# Patient Record
Sex: Male | Born: 1970 | Race: White | Hispanic: No | Marital: Married | Smoking: Current every day smoker
Health system: Southern US, Community
[De-identification: ages and names within clinical notes are randomized; demographics above are authoritative.]

## PROBLEM LIST (undated history)

## (undated) DIAGNOSIS — I10 Essential (primary) hypertension: Secondary | ICD-10-CM

## (undated) DIAGNOSIS — Z720 Tobacco use: Secondary | ICD-10-CM

## (undated) DIAGNOSIS — I255 Ischemic cardiomyopathy: Secondary | ICD-10-CM

## (undated) DIAGNOSIS — I2510 Atherosclerotic heart disease of native coronary artery without angina pectoris: Secondary | ICD-10-CM

## (undated) DIAGNOSIS — I219 Acute myocardial infarction, unspecified: Secondary | ICD-10-CM

## (undated) DIAGNOSIS — E785 Hyperlipidemia, unspecified: Secondary | ICD-10-CM

## (undated) HISTORY — DX: Hyperlipidemia, unspecified: E78.5

## (undated) HISTORY — DX: Ischemic cardiomyopathy: I25.5

## (undated) HISTORY — DX: Atherosclerotic heart disease of native coronary artery without angina pectoris: I25.10

---

## 2004-07-24 ENCOUNTER — Emergency Department: Payer: Self-pay | Admitting: Emergency Medicine

## 2004-12-16 ENCOUNTER — Emergency Department: Payer: Self-pay | Admitting: Emergency Medicine

## 2006-07-22 ENCOUNTER — Emergency Department: Payer: Self-pay | Admitting: Emergency Medicine

## 2008-12-10 ENCOUNTER — Emergency Department: Payer: Self-pay | Admitting: Emergency Medicine

## 2009-11-27 ENCOUNTER — Emergency Department: Payer: Self-pay | Admitting: Emergency Medicine

## 2010-06-17 ENCOUNTER — Emergency Department: Payer: Self-pay | Admitting: Emergency Medicine

## 2011-08-27 ENCOUNTER — Emergency Department: Payer: Self-pay | Admitting: *Deleted

## 2012-02-08 ENCOUNTER — Emergency Department: Payer: Self-pay | Admitting: Emergency Medicine

## 2012-10-06 ENCOUNTER — Emergency Department: Payer: Self-pay | Admitting: Emergency Medicine

## 2013-03-01 ENCOUNTER — Emergency Department: Payer: Self-pay | Admitting: Emergency Medicine

## 2013-05-26 ENCOUNTER — Emergency Department: Payer: Self-pay | Admitting: Emergency Medicine

## 2013-10-13 ENCOUNTER — Emergency Department: Payer: Self-pay | Admitting: Emergency Medicine

## 2014-01-18 ENCOUNTER — Emergency Department: Payer: Self-pay | Admitting: Emergency Medicine

## 2014-01-23 ENCOUNTER — Encounter (HOSPITAL_COMMUNITY)
Admission: EM | Disposition: A | Discharge status: Home / Self Care | Payer: Self-pay | Source: Home / Self Care | Attending: Cardiology

## 2014-01-23 ENCOUNTER — Ambulatory Visit (HOSPITAL_COMMUNITY): Admit: 2014-01-23 | Payer: Self-pay | Admitting: Cardiovascular Disease

## 2014-01-23 ENCOUNTER — Inpatient Hospital Stay (HOSPITAL_COMMUNITY): Payer: Self-pay

## 2014-01-23 ENCOUNTER — Inpatient Hospital Stay (HOSPITAL_COMMUNITY)
Admission: EM | Admit: 2014-01-23 | Discharge: 2014-01-25 | DRG: 247 | Disposition: A | Discharge status: Home / Self Care | Payer: Self-pay | Attending: Cardiology | Admitting: Cardiology

## 2014-01-23 ENCOUNTER — Encounter (HOSPITAL_COMMUNITY): Payer: Self-pay | Admitting: Emergency Medicine

## 2014-01-23 DIAGNOSIS — I2102 ST elevation (STEMI) myocardial infarction involving left anterior descending coronary artery: Secondary | ICD-10-CM

## 2014-01-23 DIAGNOSIS — I25119 Atherosclerotic heart disease of native coronary artery with unspecified angina pectoris: Secondary | ICD-10-CM | POA: Diagnosis present

## 2014-01-23 DIAGNOSIS — F17200 Nicotine dependence, unspecified, uncomplicated: Secondary | ICD-10-CM | POA: Diagnosis present

## 2014-01-23 DIAGNOSIS — I2109 ST elevation (STEMI) myocardial infarction involving other coronary artery of anterior wall: Principal | ICD-10-CM | POA: Diagnosis present

## 2014-01-23 DIAGNOSIS — I213 ST elevation (STEMI) myocardial infarction of unspecified site: Secondary | ICD-10-CM

## 2014-01-23 DIAGNOSIS — Z955 Presence of coronary angioplasty implant and graft: Secondary | ICD-10-CM

## 2014-01-23 DIAGNOSIS — I252 Old myocardial infarction: Secondary | ICD-10-CM | POA: Diagnosis present

## 2014-01-23 DIAGNOSIS — Z720 Tobacco use: Secondary | ICD-10-CM | POA: Diagnosis present

## 2014-01-23 DIAGNOSIS — I2510 Atherosclerotic heart disease of native coronary artery without angina pectoris: Secondary | ICD-10-CM | POA: Diagnosis present

## 2014-01-23 HISTORY — PX: LEFT HEART CATHETERIZATION WITH CORONARY ANGIOGRAM: SHX5451

## 2014-01-23 HISTORY — DX: Tobacco use: Z72.0

## 2014-01-23 LAB — DIFFERENTIAL
Basophils Absolute: 0 10*3/uL (ref 0.0–0.1)
Basophils Relative: 0 % (ref 0–1)
Eosinophils Absolute: 0.4 10*3/uL (ref 0.0–0.7)
Eosinophils Relative: 3 % (ref 0–5)
LYMPHS ABS: 3.7 10*3/uL (ref 0.7–4.0)
LYMPHS PCT: 28 % (ref 12–46)
Monocytes Absolute: 1.5 10*3/uL — ABNORMAL HIGH (ref 0.1–1.0)
Monocytes Relative: 12 % (ref 3–12)
NEUTROS ABS: 7.6 10*3/uL (ref 1.7–7.7)
NEUTROS PCT: 57 % (ref 43–77)

## 2014-01-23 LAB — BASIC METABOLIC PANEL
BUN: 10 mg/dL (ref 6–23)
CO2: 21 mEq/L (ref 19–32)
Calcium: 8.8 mg/dL (ref 8.4–10.5)
Chloride: 101 mEq/L (ref 96–112)
Creatinine, Ser: 0.81 mg/dL (ref 0.50–1.35)
GFR calc non Af Amer: >90 mL/min (ref >90)
Glucose, Bld: 92 mg/dL (ref 70–99)
Potassium: 4 mEq/L (ref 3.7–5.3)
SODIUM: 138 meq/L (ref 137–147)

## 2014-01-23 LAB — CBC
HCT: 44.6 % (ref 39.0–52.0)
Hemoglobin: 15.5 g/dL (ref 13.0–17.0)
MCH: 30.9 pg (ref 26.0–34.0)
MCHC: 34.8 g/dL (ref 30.0–36.0)
MCV: 89 fL (ref 78.0–100.0)
PLATELETS: 208 10*3/uL (ref 150–400)
RBC: 5.01 MIL/uL (ref 4.22–5.81)
RDW: 13.2 % (ref 11.5–15.5)
WBC: 13.2 10*3/uL — AB (ref 4.0–10.5)

## 2014-01-23 LAB — MRSA PCR SCREENING: MRSA by PCR: NEGATIVE

## 2014-01-23 LAB — PROTIME-INR
INR: 0.97 (ref 0.00–1.49)
PROTHROMBIN TIME: 12.7 s (ref 11.6–15.2)

## 2014-01-23 LAB — APTT: APTT: 27 s (ref 24–37)

## 2014-01-23 LAB — TROPONIN I: Troponin I: >20 ng/mL (ref <0.30)

## 2014-01-23 SURGERY — LEFT HEART CATHETERIZATION WITH CORONARY ANGIOGRAM
Anesthesia: LOCAL

## 2014-01-23 MED ORDER — PRASUGREL HCL 10 MG PO TABS
10.0000 mg | ORAL_TABLET | Freq: Every day | ORAL | Status: DC
  Administered 2014-01-24 – 2014-01-25 (×2): 10 mg via ORAL
  Filled 2014-01-23 (×2): qty 1

## 2014-01-23 MED ORDER — NITROGLYCERIN 0.4 MG SL SUBL: 0.4000 mg | SUBLINGUAL_TABLET | SUBLINGUAL | Status: DC | PRN

## 2014-01-23 MED ORDER — CARVEDILOL 3.125 MG PO TABS
3.1250 mg | ORAL_TABLET | Freq: Two times a day (BID) | ORAL | Status: DC
  Administered 2014-01-24 – 2014-01-25 (×3): 3.125 mg via ORAL
  Filled 2014-01-23 (×5): qty 1

## 2014-01-23 MED ORDER — HEPARIN SODIUM (PORCINE) 5000 UNIT/ML IJ SOLN
5000.0000 [IU] | Freq: Three times a day (TID) | INTRAMUSCULAR | Status: DC
  Administered 2014-01-24 (×2): 5000 [IU] via SUBCUTANEOUS
  Filled 2014-01-23 (×7): qty 1

## 2014-01-23 MED ORDER — OXYCODONE-ACETAMINOPHEN 5-325 MG PO TABS: 1.0000 | ORAL_TABLET | ORAL | Status: DC | PRN

## 2014-01-23 MED ORDER — PRASUGREL HCL 10 MG PO TABS
ORAL_TABLET | ORAL | Status: AC
  Filled 2014-01-23: qty 1

## 2014-01-23 MED ORDER — MIDAZOLAM HCL 2 MG/2ML IJ SOLN
INTRAMUSCULAR | Status: AC
  Filled 2014-01-23: qty 2

## 2014-01-23 MED ORDER — ATORVASTATIN CALCIUM 80 MG PO TABS
80.0000 mg | ORAL_TABLET | Freq: Every day | ORAL | Status: DC
  Administered 2014-01-23 – 2014-01-25 (×3): 80 mg via ORAL
  Filled 2014-01-23 (×3): qty 1

## 2014-01-23 MED ORDER — SODIUM CHLORIDE 0.9 % IJ SOLN: 3.0000 mL | INTRAMUSCULAR | Status: DC | PRN

## 2014-01-23 MED ORDER — NITROGLYCERIN 0.2 MG/ML ON CALL CATH LAB
INTRAVENOUS | Status: AC
  Filled 2014-01-23: qty 1

## 2014-01-23 MED ORDER — SODIUM CHLORIDE 0.9 % IV SOLN: 250.0000 mL | INTRAVENOUS | Status: DC | PRN

## 2014-01-23 MED ORDER — FENTANYL CITRATE 0.05 MG/ML IJ SOLN
INTRAMUSCULAR | Status: AC
  Filled 2014-01-23: qty 2

## 2014-01-23 MED ORDER — DIAZEPAM 5 MG PO TABS
5.0000 mg | ORAL_TABLET | ORAL | Status: DC | PRN
Start: 2014-01-23 — End: 2014-01-25

## 2014-01-23 MED ORDER — VERAPAMIL HCL 2.5 MG/ML IV SOLN
INTRAVENOUS | Status: AC
  Filled 2014-01-23: qty 2

## 2014-01-23 MED ORDER — BIVALIRUDIN 250 MG IV SOLR
INTRAVENOUS | Status: AC
  Filled 2014-01-23: qty 250

## 2014-01-23 MED ORDER — ASPIRIN 81 MG PO CHEW
81.0000 mg | CHEWABLE_TABLET | Freq: Every day | ORAL | Status: DC
  Administered 2014-01-24 – 2014-01-25 (×2): 81 mg via ORAL
  Filled 2014-01-23 (×2): qty 1

## 2014-01-23 MED ORDER — LIDOCAINE HCL (PF) 1 % IJ SOLN
INTRAMUSCULAR | Status: AC
  Filled 2014-01-23: qty 30

## 2014-01-23 MED ORDER — HEPARIN (PORCINE) IN NACL 2-0.9 UNIT/ML-% IJ SOLN
INTRAMUSCULAR | Status: AC
  Filled 2014-01-23: qty 1000

## 2014-01-23 MED ORDER — HEPARIN SODIUM (PORCINE) 5000 UNIT/ML IJ SOLN
4000.0000 [IU] | Freq: Once | INTRAMUSCULAR | Status: AC
  Administered 2014-01-23: 4000 [IU] via INTRAVENOUS

## 2014-01-23 MED ORDER — SODIUM CHLORIDE 0.9 % IV SOLN
1.0000 mL/kg/h | INTRAVENOUS | Status: AC
  Administered 2014-01-23: 1 mL/kg/h via INTRAVENOUS

## 2014-01-23 MED ORDER — SODIUM CHLORIDE 0.9 % IJ SOLN
3.0000 mL | Freq: Two times a day (BID) | INTRAMUSCULAR | Status: DC
  Administered 2014-01-24 – 2014-01-25 (×3): 3 mL via INTRAVENOUS

## 2014-01-23 NOTE — CV Procedure (Signed)
Cardiac Catheterization Procedure Note  Name: Kenneth LoweMichael Medina MRN: [id number] DOB: 12/09/1970  Procedure: Left Heart Cath, Selective Coronary Angiography, LV angiography, PTCA and stenting of the mid-LAD  Indication: Anterior STEMI  Procedural Details:  The right wrist was prepped, draped, and anesthetized with 1% lidocaine. Using the modified Seldinger technique, a 5/6 French sheath was introduced into the right radial artery. 3 mg of verapamil was administered through the sheath, weight-based unfractionated heparin was administered intravenously. Standard Judkins catheters were used for selective coronary angiography and left ventriculography. Ventriculography was performed after PCI. Catheter exchanges were performed over an exchange length guidewire.  PROCEDURAL FINDINGS Hemodynamics: AO 100/67 LV 107/24   Coronary angiography: Coronary dominance: right  Left mainstem: The left mainstem is patent. There is no significant obstructive disease. The left main divides into the LAD and left circumflex.  Left anterior descending (LAD): The proximal LAD has minor irregularities. The first diagonal bifurcates at its origin and essentially supplies a ramus intermedius territory. There is a 30-40% proximal stenosis. Just after the first septal perforator, the LAD is totally occluded with TIMI 0 flow.  Left circumflex (LCx): The left circumflex is small to medium in caliber and covers a small distribution. There are 2 small obtuse marginal branches without significant disease.  Right coronary artery (RCA): Large, dominant vessel. There are diffuse luminal irregularities without significant stenosis. The PDA branch is large with mild 20-30% proximal stenosis. The first and second posterolateral branches are moderate in caliber without significant stenosis. There is collateral filling of the apical LAD.  Left ventriculography: Left ventricular systolic function is mildly depressed. There is  severe hypokinesis of the mid and distal anterior walls as well as the apex. The basal anterior and basal and mid-inferior walls are hyperdynamic. The estimated LVEF is 45%.  PCI Note:  Following the diagnostic procedure, the decision was made to proceed with PCI. The patient had received aspirin 324 mg and heparin 4000 units in the emergency department. He was loaded with effient 60 mg on the table. Weight-based bivalirudin was given for anticoagulation. Once a therapeutic ACT was achieved, a 6 JamaicaFrench XB LAD guide catheter was inserted.  A cougar coronary guidewire was used to cross the lesion.  The lesion was predilated with a 2.0 x 15 mm balloon.  The lesion was then stented with a 2.75 x 22 mm resolute drug-eluting stent.  The stent was postdilated with a 3.25 mm noncompliant balloon to 14 atmospheres.  Following PCI, there was 0% residual stenosis and TIMI-3 flow. Final angiography confirmed an excellent result. The patient tolerated the procedure well. He was chest pain-free at the completion of the procedure There were no immediate procedural complications. A TR band was used for radial hemostasis. The patient was transferred to the post catheterization recovery area for further monitoring.  PCI Data: Vessel - LAD/Segment - mid Percent Stenosis (pre)  100  TIMI-flow 0 Stent 2.75x22 mm Resolute DES (post-dil to > 3.25) Percent Stenosis (post) 0 TIMI-flow (post) 3  Final Conclusions:   1. Total occlusion of the LAD after the first septal perforator, treated successfully with primary PCI using a DES platform 2. Minor nonobstructive stenosis of the LM, LCx, and RCA 3. Mild segmental LV systolic dysfunction with moderately elevated LVEDP   Recommendations:  Initiate post-MI med Rx with carvedilol, ASA, Effient, high-dose atorvastatin. Start low-dose ACE in am if BP will allow. Tobacco cessation counseling. Anticipate tx out of ICU tomorrow if stable and discharge on hospital day #2.  Kenneth Medina 01/23/2014, 9:55 PM

## 2014-01-23 NOTE — Interval H&P Note (Signed)
History and Physical Interval Note:  01/23/2014 9:13 PM  Kenneth Medina  has presented today for surgery, with the diagnosis of STEMI  The various methods of treatment have been discussed with the patient and family. After consideration of risks, benefits and other options for treatment, the patient has consented to  Procedure(s): LEFT HEART CATHETERIZATION WITH CORONARY ANGIOGRAM (N/A) as a surgical intervention .  The patient's history has been reviewed, patient examined, no change in status, stable for surgery.  I have reviewed the patient's chart and labs.  Questions were answered to the patient's satisfaction.    Cath Lab Visit (complete for each Cath Lab visit)  Clinical Evaluation Leading to the Procedure:   ACS: yes  Non-ACS:    Anginal Classification: CCS IV  Anti-ischemic medical therapy: No Therapy  Non-Invasive Test Results: No non-invasive testing performed  Prior CABG: No previous CABG       Tonny Bollman

## 2014-01-23 NOTE — ED Notes (Signed)
Patient being transported on zoll to cath lab at this time by [facility]. Pt remains A&Ox4. Resp even and unlabored.

## 2014-01-23 NOTE — H&P (Signed)
History and Physical  Patient ID: Kenneth LoweMichael Medina MRN: [id number], SOB: 04/10/1971 43 y.o. Date of Encounter: 01/23/2014, 9:11 PM  Primary Physician: No primary provider on file. Primary Cardiologist: none  Chief Complaint: Chest pain  HPI: 43 y.o. male w/ PMHx significant for tobacco who [facility] with complaints of chest pain.  The patient developed severe substernal chest pain radiating down both arms approximately one hour prior to arrival here. He had no prodromal symptoms. He's had no recent illnesses. He called EMS as his pain was severe, rated 10/10. His EKG in the field demonstrated anterior injury currents and a code STEMI was called. I evaluated the patient on arrival to the emergency department. He had received fentanyl 100 mcg IV and aspirin 324 mg orally prior to arrival. He was also on nitroglycerin paste. His pain level on arrival was 3/10.  The patient has no other medical problems. He has had a neck strain at work but is not currently taking any medication for this. He had no past surgeries. He has no history of hypertension, diabetes, dyslipidemia, or any family history of CAD. He is an active smoker approximately 1/2 pack per day. He has no history of bleeding problems or DVT.  Past Medical History  Diagnosis Date  . Tobacco abuse      Surgical History: History reviewed. No pertinent past surgical history.   Home Meds: Prior to Admission medications   Not on File    Allergies:  Allergies  Allergen Reactions  . Ibuprofen     History   Social History  . Marital Status: N/A    Spouse Name: N/A    Number of Children: N/A  . Years of Education: N/A   Occupational History  . Not on file.   Social History Main Topics  . Smoking status: Not on file  . Smokeless tobacco: Not on file  . Alcohol Use: Not on file  . Drug Use: Not on file  . Sexual Activity: Not on file   Other Topics Concern  . Not on file   Social  History Narrative   The patient is married. 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 He does not drink alcohol or use illicit drugs. He does smoke cigarettes one half pack per day.     No family history on file.  Review of Systems: General: negative for chills, fever, night sweats or weight changes.  ENT: negative for rhinorrhea or epistaxis Cardiovascular: See history of present illness Dermatological: negative for rash Respiratory: negative for cough or wheezing GI: negative for nausea, vomiting, diarrhea, bright red blood per rectum, melena, or hematemesis GU: no hematuria, urgency, or frequency Neurologic: negative for visual changes, syncope, headache, or dizziness Heme: no easy bruising or bleeding Endo: negative for excessive thirst, thyroid disorder, or flushing Musculoskeletal: Positive for neck pain All other systems reviewed and are otherwise negative except as noted above.  Physical Exam: Blood pressure 131/80, pulse 77, resp. rate 19, weight 81.647 kg (180 lb), SpO2 100.00%. General: Well developed, well nourished, alert and oriented, in moderate distress. HEENT: Normocephalic, atraumatic, sclera non-icteric, no xanthomas, nares are without discharge.  Neck: Supple. Carotids 2+ without bruits. JVP normal Lungs: Clear bilaterally to auscultation without wheezes, rales, or rhonchi. Breathing is unlabored. Heart: RRR with normal S1 and S2. No murmurs, rubs, or gallops appreciated. Abdomen: Soft, non-tender, non-distended with normoactive bowel sounds. No hepatomegaly. No rebound/guarding. No obvious abdominal masses. Back: No CVA  tenderness Msk:  Strength and tone appear normal for age. Extremities: No clubbing, cyanosis, or edema.  Distal pedal pulses are 2+ and equal bilaterally. Neuro: CNII-XII intact, moves all extremities spontaneously. Psych:  Responds to questions appropriately   Labs:   Lab Results  Component Value Date   WBC 13.2* 01/23/2014   HGB 15.5  01/23/2014   HCT 44.6 01/23/2014   MCV 89.0 01/23/2014   PLT 208 01/23/2014     Recent Labs Lab 01/23/14 2033  NA 138  K 4.0  CL 101  CO2 21  BUN 10  CREATININE 0.81  CALCIUM 8.8  GLUCOSE 92   No results found for this basename: CKTOTAL, CKMB, TROPONINI,  in the last 72 hours No results found for this basename: CHOL,  HDL,  LDLCALC,  TRIG   No results found for this basename: DDIMER    Radiology/Studies:  No results found.   EKG: Sinus bradycardia with acute anterior ST elevation MI pattern  ASSESSMENT AND PLAN:  1. Acute anterior wall MI. The patient has received aspirin 324 mg in the field. He is given unfractionated heparin 4000 units in the emergency department. He will be taken directly to the cardiac catheterization lab for cardiac cath and possible PCI. I have reviewed the potential risks, indications, and alternatives to this procedure. Emergency applied consent was obtained. Further plans pending procedural findings.  2. Tobacco abuse. Cessation counseling will be done.  Will check a lipid panel, baseline labs, and initiate post MI medical therapy as indicated.  Kenneth Medina, Kenneth Medina  01/23/2014, 9:11 PM

## 2014-01-23 NOTE — ED Provider Notes (Signed)
CSN: [id number]     Arrival date & time 01/23/14  1956 History   First MD Initiated Contact with Patient 01/23/14 2006     Chief Complaint  Patient presents with  . Code STEMI     (Consider location/radiation/quality/duration/timing/severity/associated sxs/prior Treatment) HPI Pt brought to the ED via [facility] for evaluation of STEMI. He had sudden onset chest pain, SOB and L arm pain earlier today. EMS gave ASA, NTG and fentanyl with partial relief of pain from 10/10 to 2/10. He has not known PMH, but he is a smoker. Cath lab not ready on patient arrival, so he stopped for brief evaluation in the ED. Dr. Excell Seltzer at bedside on patient arrival.   History reviewed. No pertinent past medical history. History reviewed. No pertinent past surgical history. No family history on file. History  Substance Use Topics  . Smoking status: Not on file  . Smokeless tobacco: Not on file  . Alcohol Use: Not on file    Review of Systems All other systems reviewed and are negative except as noted in HPI.     Allergies  Ibuprofen  Home Medications   Prior to Admission medications   Not on File   BP 131/80  Pulse 77  Resp 19  Wt 180 lb (81.647 kg)  SpO2 100% Physical Exam  Nursing note and vitals reviewed. Constitutional: He is oriented to person, place, and time. He appears well-developed and well-nourished.  HENT:  Head: Normocephalic and atraumatic.  Eyes: EOM are normal. Pupils are equal, round, and reactive to light.  Neck: Normal range of motion. Neck supple.  Cardiovascular: Normal rate, normal heart sounds and intact distal pulses.   Pulmonary/Chest: Effort normal and breath sounds normal.  Abdominal: Bowel sounds are normal. He exhibits no distension. There is no tenderness.  Musculoskeletal: Normal range of motion. He exhibits no edema and no tenderness.  Neurological: He is alert and oriented to person, place, and time. He has normal strength. No cranial nerve deficit  or sensory deficit.  Skin: Skin is warm and dry. No rash noted.  Psychiatric: He has a normal mood and affect.    ED Course  Procedures (including critical care time) Labs Review Labs Reviewed - No data to display  Imaging Review No results found.  EKG not in MUSE for interpretation  Date: 01/23/2014  Rate: 69  Rhythm: normal sinus rhythm  QRS Axis: normal  Intervals: normal  ST/T Wave abnormalities: ST elevations anteriorly  Conduction Disutrbances:none  Narrative Interpretation:   Old EKG Reviewed: none available    MDM   Final diagnoses:  STEMI (ST elevation myocardial infarction)    STEMI patient, given Heparin, taken to cath lab    Charles B. Bernette Mayers, MD 01/23/14 2013

## 2014-01-23 NOTE — ED Notes (Signed)
Patient presents to ED via [facility]. Pt states that he had a "sudden onset" of center chest pain with no radiation. Pt denies any shortness of breath, no nausea/vomiting. Pt A&Ox4. Per EMS elevation in anterior leads and depression in inferior leads. Code Stemi called. Cardiology at bedside. Pt given 324 ASA. 3 sl nitro, 1 nitro patch and 100 mcg of fentanyl by EMS. Rating pain 3/10 upon arrival. VSS.

## 2014-01-23 NOTE — ED Notes (Signed)
Dr. Excell Seltzer and Dr. Bernette Mayers at bedside at this time.

## 2014-01-23 NOTE — ED Notes (Signed)
Patient placed on zoll and 12 lead. Dr. Excell Seltzer requesting that patient be taken to cath lab at this time.

## 2014-01-23 NOTE — Progress Notes (Signed)
CRITICAL VALUE ALERT  Critical value received:  Troponin >20.00  Date of notification:  01/23/14  Time of notification: 2334  Critical value read back:yes  Nurse who received alert: Bronson Curb RN  Expected lab value. Pt is post Cath Lab from code STEMI

## 2014-01-24 ENCOUNTER — Encounter (HOSPITAL_COMMUNITY): Payer: Self-pay | Admitting: Emergency Medicine

## 2014-01-24 DIAGNOSIS — F17200 Nicotine dependence, unspecified, uncomplicated: Secondary | ICD-10-CM

## 2014-01-24 DIAGNOSIS — I517 Cardiomegaly: Secondary | ICD-10-CM

## 2014-01-24 DIAGNOSIS — I2510 Atherosclerotic heart disease of native coronary artery without angina pectoris: Secondary | ICD-10-CM | POA: Diagnosis present

## 2014-01-24 DIAGNOSIS — I25119 Atherosclerotic heart disease of native coronary artery with unspecified angina pectoris: Secondary | ICD-10-CM | POA: Diagnosis present

## 2014-01-24 DIAGNOSIS — I219 Acute myocardial infarction, unspecified: Secondary | ICD-10-CM

## 2014-01-24 DIAGNOSIS — Z720 Tobacco use: Secondary | ICD-10-CM | POA: Diagnosis present

## 2014-01-24 LAB — BASIC METABOLIC PANEL
BUN: 9 mg/dL (ref 6–23)
CO2: 25 mEq/L (ref 19–32)
Calcium: 8.6 mg/dL (ref 8.4–10.5)
Chloride: 106 mEq/L (ref 96–112)
Creatinine, Ser: 0.78 mg/dL (ref 0.50–1.35)
GLUCOSE: 99 mg/dL (ref 70–99)
POTASSIUM: 4 meq/L (ref 3.7–5.3)
Sodium: 142 mEq/L (ref 137–147)

## 2014-01-24 LAB — CBC
HCT: 43 % (ref 39.0–52.0)
HEMOGLOBIN: 14.4 g/dL (ref 13.0–17.0)
MCH: 30.1 pg (ref 26.0–34.0)
MCHC: 33.5 g/dL (ref 30.0–36.0)
MCV: 90 fL (ref 78.0–100.0)
PLATELETS: 219 10*3/uL (ref 150–400)
RBC: 4.78 MIL/uL (ref 4.22–5.81)
RDW: 13.5 % (ref 11.5–15.5)
WBC: 13.1 10*3/uL — ABNORMAL HIGH (ref 4.0–10.5)

## 2014-01-24 LAB — HEMOGLOBIN A1C
HEMOGLOBIN A1C: 5.8 % — AB (ref <5.7)
Mean Plasma Glucose: 120 mg/dL — ABNORMAL HIGH (ref <117)

## 2014-01-24 LAB — LIPID PANEL
CHOL/HDL RATIO: 8.1 ratio
CHOLESTEROL: 179 mg/dL (ref 0–200)
HDL: 22 mg/dL — ABNORMAL LOW (ref >39)
LDL Cholesterol: 129 mg/dL — ABNORMAL HIGH (ref 0–99)
Triglycerides: 142 mg/dL (ref <150)
VLDL: 28 mg/dL (ref 0–40)

## 2014-01-24 LAB — TROPONIN I: Troponin I: >20 ng/mL (ref <0.30)

## 2014-01-24 LAB — TSH: TSH: 0.595 u[IU]/mL (ref 0.350–4.500)

## 2014-01-24 LAB — POCT ACTIVATED CLOTTING TIME: Activated Clotting Time: 409 seconds

## 2014-01-24 LAB — PRO B NATRIURETIC PEPTIDE: Pro B Natriuretic peptide (BNP): 185.8 pg/mL — ABNORMAL HIGH (ref 0–125)

## 2014-01-24 NOTE — Progress Notes (Signed)
    SUBJECTIVE:  Kenneth Medina was seen and examined at bedside this morning. S/p cath last night. Feeling much better today. No CP or SOB.   Filed Vitals:   01/24/14 0300 01/24/14 0400 01/24/14 0500 01/24/14 0600  BP: 114/41 104/63 105/61 106/59  Pulse: 63 65 63 58  Temp:      TempSrc:      Resp: 19 15 15 14   Height:      Weight:      SpO2: 99% 99% 99% 99%    Intake/Output Summary (Last 24 hours) at 01/24/14 0707 Last data filed at 01/24/14 0600  Gross per 24 hour  Intake  652.8 ml  Output   1100 ml  Net -447.2 ml   LABS: Basic Metabolic Panel:  Recent Labs  23/76/28 2033 01/24/14 0434  NA 138 142  K 4.0 4.0  CL 101 106  CO2 21 25  GLUCOSE 92 99  BUN 10 9  CREATININE 0.81 0.78  CALCIUM 8.8 8.6   CBC:  Recent Labs  01/23/14 2033 01/24/14 0434  WBC 13.2* 13.1*  NEUTROABS 7.6  --   HGB 15.5 14.4  HCT 44.6 43.0  MCV 89.0 90.0  PLT 208 219   Cardiac Enzymes:  Recent Labs  01/23/14 2242 01/24/14 0434  TROPONINI >20.00* >20.00*   Fasting Lipid Panel:  Recent Labs  01/24/14 0434  CHOL 179  HDL 22*  LDLCALC 129*  TRIG 142  CHOLHDL 8.1   Thyroid Function Tests:  Recent Labs  01/24/14 0434  TSH 0.595   RADIOLOGY: Portable Chest X-ray 1 View  01/23/2014   CLINICAL DATA:  Chest pain.  Admission for STEMI  EXAM: PORTABLE CHEST - 1 VIEW  COMPARISON:  None.  FINDINGS: The heart size and mediastinal contours are within normal limits. Both lungs are clear. The visualized skeletal structures are unremarkable.  IMPRESSION: No active disease.   Electronically Signed   By: Burman Nieves M.D.   On: 01/23/2014 22:25   PHYSICAL EXAM General: NAD, lying in bed Lungs: Clear to auscultation bilaterally with normal respiratory effort. CV: Bradycardia. No murmur Abdomen: Soft, nontender, +bs Neurologic: Alert and oriented x 3.  Psych: Normal affect. Extremities: +2dp b/l, -edema  ASSESSMENT AND PLAN: Kenneth Medina is a 43 year old smoking male admitted  for STEMI.  Anterior STEMI--s/p LHC 01/23/14 with DES to LAD, minor nonobstructive stenosis of LM, LCx, and RCA, mild LV systolic segmental dysfunction with moderately elevated LVEDP. -continue ASA, Effient, statin, BB -would benefit from low dose ACEi if BP can tolerate, currently 100's/50's -smoking cessation strongly advised -cardiac rehab -transfer to tele -anticipated d/c tomorrow CAD Tobacco use, ongoing  Case discussed and patient seen with Dr. Shirlee Latch  Signed: Darden Palmer, MD PGY-2, Internal Medicine Resident Pager: [phone]  01/24/2014,7:11 AM  Patient seen and examined with Dr. Virgina Organ We discussed all aspects of the encounter. I agree with the assessment and plan as stated above.   He stable s/p emergent PCI of LAD in setting of anterior STEMI. EF 45%. No further CP. Tolerating current regimen. BP too soft for ACE-I. Can transfer to tele today. Cardiac rehab to see. Stressed importance of smoking cessation, DAPT and statin therapy.   Probable d/c home in am.   Bevelyn Buckles Bensimhon,MD 8:35 AM

## 2014-01-24 NOTE — Progress Notes (Signed)
  Echocardiogram 2D Echocardiogram has been performed.  Kenneth Medina 01/24/2014, 11:07 AM

## 2014-01-24 NOTE — Progress Notes (Signed)
CARDIAC REHAB PHASE I   PRE:  Rate/Rhythm: 69 SR  BP:  Supine: 114/64  Sitting:   Standing:    SaO2:   MODE:  Ambulation: 700 ft   POST:  Rate/Rhythm: 88 SR  BP:  Supine:   Sitting: 117/59  Standing:    SaO2:  0835-0940 Pt walked 700 ft with steady gait. No CP. MI ed completed with pt who voiced understanding. Discussed CRP 2 and pt gave permission to refer to [facility] program. Discussed smoking cessation and gave handouts. Pt stated he and wife plan to quit together. Gave effient packet but pt needs to see case manager as he has no insurance. Discussed decreasing salt intake as pt stated he salts everything. Pt also advised to cut back on caffeine and no more energy drinks.   Luetta Nutting, RN BSN  01/24/2014 9:55 AM

## 2014-01-24 NOTE — Care Management Note (Signed)
    Page 1 of 1   01/24/2014     12:10:30 PM CARE MANAGEMENT NOTE 01/24/2014  Patient:  WESSON, HOLDERBAUM   Account Number:  [id number]  Date Initiated:  01/24/2014  Documentation initiated by:  Junius Creamer  Subjective/Objective Assessment:   adm w mi     Action/Plan:   lives w fam   Anticipated DC Date:     Anticipated DC Plan:        DC Planning Services  CM consult  Medication Assistance  [facility]      Choice offered to / List presented to:             Status of service:   Medicare Important Message given?   (If response is "NO", the following Medicare IM given date fields will be blank) Date Medicare IM given:   Date Additional Medicare IM given:    Discharge Disposition:    Per UR Regulation:  Reviewed for med. necessity/level of care/duration of stay  If discussed at Long Length of Stay Meetings, dates discussed:    Comments:  5/13 1210p debbie Lorrayne Ismael rn,bsn spoke w pt. no ins at present. pt given 30day free effient card. effient pt assist form on shadow chart. gave pt prescription discount card for brand name meds that may help w cost. gave [place]

## 2014-01-24 NOTE — Plan of Care (Signed)
Problem: Consults Goal: Tobacco Cessation referral if indicated Outcome: Completed/Met Date Met:  01/24/14 Pt reports he is quitting after this experience.

## 2014-01-24 NOTE — Progress Notes (Signed)
Attempted to call report to 3W nurse. Nurse unable to get report. Phone number given to Diplomatic Services operational officer. Awaiting returned call.

## 2014-01-25 DIAGNOSIS — I2109 ST elevation (STEMI) myocardial infarction involving other coronary artery of anterior wall: Secondary | ICD-10-CM

## 2014-01-25 LAB — CBC
HEMATOCRIT: 45.3 % (ref 39.0–52.0)
Hemoglobin: 15 g/dL (ref 13.0–17.0)
MCH: 30.2 pg (ref 26.0–34.0)
MCHC: 33.1 g/dL (ref 30.0–36.0)
MCV: 91.3 fL (ref 78.0–100.0)
PLATELETS: 218 10*3/uL (ref 150–400)
RBC: 4.96 MIL/uL (ref 4.22–5.81)
RDW: 13.9 % (ref 11.5–15.5)
WBC: 12.8 10*3/uL — ABNORMAL HIGH (ref 4.0–10.5)

## 2014-01-25 MED ORDER — PRASUGREL HCL 10 MG PO TABS: 10.0000 mg | ORAL_TABLET | Freq: Every day | ORAL | Status: DC

## 2014-01-25 MED ORDER — ASPIRIN 81 MG PO CHEW: 81.0000 mg | CHEWABLE_TABLET | Freq: Every day | ORAL | Status: AC

## 2014-01-25 MED ORDER — ATORVASTATIN CALCIUM 80 MG PO TABS: 80.0000 mg | ORAL_TABLET | Freq: Every day | ORAL | Status: DC

## 2014-01-25 MED ORDER — NITROGLYCERIN 0.4 MG SL SUBL: 0.4000 mg | SUBLINGUAL_TABLET | SUBLINGUAL | Status: DC | PRN

## 2014-01-25 MED ORDER — CARVEDILOL 3.125 MG PO TABS
3.1250 mg | ORAL_TABLET | Freq: Two times a day (BID) | ORAL | Status: DC
Start: 2014-01-25 — End: 2014-02-12

## 2014-01-25 MED FILL — Sodium Chloride IV Soln 0.9%: INTRAVENOUS | Qty: 50 | Status: AC

## 2014-01-25 NOTE — Discharge Summary (Signed)
` Physician Discharge Summary  Patient ID: Kenneth LoweMichael Medina MRN: [id number] DOB/AGE: 45/02/1971 45 y.o.  Primary Cardiologist: Dr. Excell Seltzerooper  Admit date: 01/23/2014 Discharge date: 01/25/2014  Admission Diagnoses: Anterior STEMI  Discharge Diagnoses:  Principal Problem:   ST elevation (STEMI) myocardial infarction involving left anterior descending coronary artery Active Problems:   CAD (coronary artery disease)   Tobacco use   Discharged Condition: stable  Hospital Course:  The patient is a 43 y.o. male, admitted on 01/23/14 for STEMI. His PMHx is significant for tobacco use. He developed severe substernal chest pain radiating down both arms approximately one hour prior to [facility]. He had no prodromal symptoms and no recent illnesses. He called EMS as his pain was severe, rated 10/10. His EKG in the field demonstrated anterior injury currents and a code STEMI was called. He was taken urgently to the cath lab. Dr. Excell Seltzerooper performed the procedure via the right radial artery. He was found to have total occlusion of the LAD after the first septal perforator, treated successfully with primary PCI using a DES platform. There was also minor nonobstructive stenosis of the LM, LCx, and RCA. There was mild segmental LV systolic dysfunction with moderately elevated LVEDP. EF was estimated at 45%. He left the cath lab in stable condition. He was started on DAPT with ASA + Effient. Low dose Coreg was also initiated. His BP did not allow for initiation of an ACE/ARB. He was also started on 80 mg of Lipitor. His fasting lipid panel showed an elevated LDL at 129 and low HDL at 22. Hgb A1c was also checked and was elevated at 5.8, suggesting that he is at increase risk for developing DM.   He had no post cath complications. He had no recurrent chest pain. The right radial access site remained stable. Cardiac rehab was consulted. He had no difficulties ambulating. He was educated on the importance of smoking  cessation and seemed highly motivated to quit. He was last seen and examined by Dr. Rennis GoldenHilty, who determined he was stable for discharge home. He is scheduled for post-hospital f/u with Jacolyn ReedyMichele Lenze, PA-C, on 02/12/14. He will need long term f/u with Dr. Excell Seltzerooper.    Consults: None  Significant Diagnostic Studies:  Emergent LHC 01/23/14 PROCEDURAL FINDINGS  Hemodynamics:  AO 100/67  LV 107/24  Coronary angiography:  Coronary dominance: right  Left mainstem: The left mainstem is patent. There is no significant obstructive disease. The left main divides into the LAD and left circumflex.  Left anterior descending (LAD): The proximal LAD has minor irregularities. The first diagonal bifurcates at its origin and essentially supplies a ramus intermedius territory. There is a 30-40% proximal stenosis. Just after the first septal perforator, the LAD is totally occluded with TIMI 0 flow.  Left circumflex (LCx): The left circumflex is small to medium in caliber and covers a small distribution. There are 2 small obtuse marginal branches without significant disease.  Right coronary artery (RCA): Large, dominant vessel. There are diffuse luminal irregularities without significant stenosis. The PDA branch is large with mild 20-30% proximal stenosis. The first and second posterolateral branches are moderate in caliber without significant stenosis. There is collateral filling of the apical LAD.  Left ventriculography: Left ventricular systolic function is mildly depressed. There is severe hypokinesis of the mid and distal anterior walls as well as the apex. The basal anterior and basal and mid-inferior walls are hyperdynamic. The estimated LVEF is 45%.   Treatments: See Hospital Course  Discharge Exam: Blood  pressure 105/58, pulse 65, temperature 97.9 F (36.6 C), temperature source Oral, resp. rate 18, height 5\' 9"  (1.753 m), weight 168 lb 3.2 oz (76.295 kg), SpO2 96.00%.  Disposition: Final discharge disposition  not confirmed      Discharge Orders   Future Appointments Provider Department Dept Phone   02/12/2014 8:45 AM Dyann Kief, PA-C [facility] [phone]   Future Orders Complete By Expires   Amb Referral to Cardiac Rehabilitation  As directed    Diet - low sodium heart healthy  As directed    Increase activity slowly  As directed        Medication List         aspirin 81 MG chewable tablet  Chew 1 tablet (81 mg total) by mouth daily.     atorvastatin 80 MG tablet  Commonly known as:  LIPITOR  Take 1 tablet (80 mg total) by mouth daily at 6 PM.     carvedilol 3.125 MG tablet  Commonly known as:  COREG  Take 1 tablet (3.125 mg total) by mouth 2 (two) times daily with a meal.     nitroGLYCERIN 0.4 MG SL tablet  Commonly known as:  NITROSTAT  Place 1 tablet (0.4 mg total) under the tongue every 5 (five) minutes x 3 doses as needed for chest pain.     prasugrel 10 MG Tabs tablet  Commonly known as:  EFFIENT  Take 1 tablet (10 mg total) by mouth daily.     prasugrel 10 MG Tabs tablet  Commonly known as:  EFFIENT  Take 1 tablet (10 mg total) by mouth daily.       Follow-up Information   Follow up with Jacolyn Reedy, PA-C On 02/12/2014. (8:45 am)    Specialty:  Cardiology   Contact information:   [address] [place] [address] [phone]      TIME SPENT ON DISCHARGE, INCLUDING PHYSICIAN TIME: >30 MINUTES  Signed: Robbie Lis 01/25/2014, 10:21 AM

## 2014-01-25 NOTE — Progress Notes (Signed)
Patient Profile: 43 y/o male with PMH significant for tobacco use, admitted 01/23/14 for anterior STEMI and found to have total occlusion of the LAD, successfully treated with primary PCI utilizing a DES. Also with minor nonobstructive stenosis of the LM, LCx and RCA. EF 45%.   Newly diagnosed DLD LDL 129 HDL 22  Pre-diabetes  Hgb [id number] 5.8   Subjective: Feels great. Denies further chest pain. No SOB. Ambulating w/ cardiac rehab w/o difficulty.   Objective: Vital signs in last 24 hours: Temp:  [97.4 F (36.3 C)-99.1 F (37.3 C)] 97.9 F (36.6 C) (05/14 0547) Pulse Rate:  [54-71] 65 (05/14 0547) Resp:  [14-18] 18 (05/14 0547) BP: (105-152)/(50-90) 105/58 mmHg (05/14 0547) SpO2:  [96 %-100 %] 96 % (05/14 0547) Weight:  [168 lb 3.2 oz (76.295 kg)] 168 lb 3.2 oz (76.295 kg) (05/14 0547) Last BM Date: 01/23/14  Intake/Output from previous day: 05/13 0701 - 05/14 0700 In: 669 [P.O.:666; I.V.:3] Out: 1900 [Urine:1900] Intake/Output this shift:    Medications Current Facility-Administered Medications  Medication Dose Route Frequency Provider Last Rate Last Dose  . 0.9 %  sodium chloride infusion  250 mL Intravenous PRN Tonny BollmanMichael Cooper, MD      . aspirin chewable tablet 81 mg  81 mg Oral Daily Tonny BollmanMichael Cooper, MD   81 mg at 01/24/14 1019  . atorvastatin (LIPITOR) tablet 80 mg  80 mg Oral q1800 Tonny BollmanMichael Cooper, MD   80 mg at 01/24/14 1615  . carvedilol (COREG) tablet 3.125 mg  3.125 mg Oral BID WC Tonny BollmanMichael Cooper, MD   3.125 mg at 01/25/14 0713  . diazepam (VALIUM) tablet 5 mg  5 mg Oral Q4H PRN Tonny BollmanMichael Cooper, MD      . heparin injection 5,000 Units  5,000 Units Subcutaneous 3 times per day Tonny BollmanMichael Cooper, MD   5,000 Units at 01/24/14 1356  . nitroGLYCERIN (NITROSTAT) SL tablet 0.4 mg  0.4 mg Sublingual Q5 Min x 3 PRN Tonny BollmanMichael Cooper, MD      . oxyCODONE-acetaminophen (PERCOCET/ROXICET) 5-325 MG per tablet 1-2 tablet  1-2 tablet Oral Q4H PRN Tonny BollmanMichael Cooper, MD      . prasugrel  (EFFIENT) tablet 10 mg  10 mg Oral Daily Tonny BollmanMichael Cooper, MD   10 mg at 01/24/14 1019  . sodium chloride 0.9 % injection 3 mL  3 mL Intravenous Q12H Tonny BollmanMichael Cooper, MD   3 mL at 01/24/14 2256  . sodium chloride 0.9 % injection 3 mL  3 mL Intravenous PRN Tonny BollmanMichael Cooper, MD        PE: General appearance: alert, cooperative and no distress Lungs: clear to auscultation bilaterally Heart: regular rate and rhythm, S1, S2 normal, no murmur, click, rub or gallop Extremities: no LEE Pulses: 2+ and symmetric Skin: warm and dry Neurologic: Grossly normal  Lab Results:   Recent Labs  01/23/14 2033 01/24/14 0434 01/25/14 0458  WBC 13.2* 13.1* 12.8*  HGB 15.5 14.4 15.0  HCT 44.6 43.0 45.3  PLT 208 219 218   BMET  Recent Labs  01/23/14 2033 01/24/14 0434  NA 138 142  K 4.0 4.0  CL 101 106  CO2 21 25  GLUCOSE 92 99  BUN 10 9  CREATININE 0.81 0.78  CALCIUM 8.8 8.6   PT/INR  Recent Labs  01/23/14 2033  LABPROT 12.7  INR 0.97   Cholesterol  Recent Labs  01/24/14 0434  CHOL 179     Assessment/Plan  Principal Problem:   ST elevation (STEMI) myocardial infarction involving left anterior  descending coronary artery Active Problems:   CAD (coronary artery disease)   Tobacco use  1. STEMI/ CAD: s/p DES to LAD. EF 45%. Denies further chest pain. No SOB. Ambulating w/o difficulty. Continue DATP with ASA + Effient, for a minimum of 1 yr. Continue BB and statin. BP still too soft for ACE. Can add as an OP if BP is improved.   2. DLD: LDL is 129. Goal is < 70. Continue Lipitor.   3. Tobacco use: discussed importance of smoking cessation. Pt is motivated to quit.  Can likely d/c home today. F/u with Dr. Excell Seltzer.    LOS: 2 days    Brittainy M. Sharol Harness, PA-C 01/25/2014 8:45 AM

## 2014-01-25 NOTE — Progress Notes (Signed)
Pt. Seen and examined. Agree with the NP/PA-C note as written.  Doing better - ready to go home. Long discussion about continued smoking cessation, coming off of significant energy drink and [employer] use. He will also need dental attention in the future - may be a while before he can have his teeth pulled due to his DAPT therapy (probably not for 1 year).  Follow-up with Dr. Excell Seltzer.  Chrystie Nose, MD, [facility] Attending Cardiologist [facility]

## 2014-01-25 NOTE — Progress Notes (Signed)
CARDIAC REHAB PHASE I   PRE:  Rate/Rhythm: 71 SR    BP: sitting 110/60    SaO2:   MODE:  Ambulation: 750 ft   POST:  Rate/Rhythm: 92 SR    BP: sitting 110/70     SaO2:   Tolerated very well, no c/o. Reviewed decreasing sugar and caffeine and smoking cessation. Voiced understanding and motivation. Pt anxious for d/c. [phone]   Megan Salon CES, ACSM 01/25/2014 8:53 AM

## 2014-01-25 NOTE — Care Management (Signed)
1114 01-25-14 Pt assistance form for effient was filled out and given to pt before d/c to fax into company. No further needs from CM at this time. Lamar Laundry [place], RN,BSN [phone]

## 2014-01-25 NOTE — Progress Notes (Signed)
Pt provided with dc instructions and education. Pt verbalized understanding. Pt has no questions. Pt aware of all new medications and when to take them. Pt has 30 day free card for effient with prescription. IV removed with tip intact. Heart monitor cleaned and returned to front. Levonne Spiller, RN

## 2014-02-06 ENCOUNTER — Other Ambulatory Visit: Payer: Self-pay | Admitting: Physician Assistant

## 2014-02-12 ENCOUNTER — Encounter: Payer: Self-pay | Admitting: Physician Assistant

## 2014-02-12 ENCOUNTER — Ambulatory Visit (INDEPENDENT_AMBULATORY_CARE_PROVIDER_SITE_OTHER): Payer: Self-pay | Admitting: Physician Assistant

## 2014-02-12 ENCOUNTER — Encounter: Payer: Self-pay | Admitting: *Deleted

## 2014-02-12 VITALS — BP 115/58 | HR 61 | Ht 69.0 in | Wt 174.1 lb

## 2014-02-12 DIAGNOSIS — I255 Ischemic cardiomyopathy: Secondary | ICD-10-CM | POA: Insufficient documentation

## 2014-02-12 DIAGNOSIS — E785 Hyperlipidemia, unspecified: Secondary | ICD-10-CM

## 2014-02-12 DIAGNOSIS — I219 Acute myocardial infarction, unspecified: Secondary | ICD-10-CM

## 2014-02-12 MED ORDER — CARVEDILOL 6.25 MG PO TABS: 6.2500 mg | ORAL_TABLET | Freq: Two times a day (BID) | ORAL | Status: DC

## 2014-02-12 NOTE — Assessment & Plan Note (Signed)
Smoking cessation discussed 

## 2014-02-12 NOTE — Assessment & Plan Note (Signed)
EF 45% after recent STEMI. Consider followup 2-D echo in 3-4 months.

## 2014-02-12 NOTE — Patient Instructions (Signed)
Your physician recommends that you schedule a follow-up appointment in: 2 MONTHS WITH DR. Excell Seltzer  Your physician recommends that you return for lab work in: IN 4 WEEKS (LIPIDS/LIVER)  Your physician has recommended you make the following change in your medication:   INCREASE COREG 6.25 MG TWICE A DAY  YOU MAY RETURN TO WORK PER YOUR PROVIDER BUT CAN NOT LIFT ANYTHING MORE THEN 25LBS  Your physician discussed the hazards of tobacco use. Tobacco use cessation is recommended and techniques and options to help you quit were discussed.   Smokeless Tobacco Use Smokeless tobacco is a loose, fine, or stringy tobacco. The tobacco is not smoked like a cigarette, but it is chewed or held in the lips or cheeks. It resembles tea and comes from the leaves of the tobacco plant. Smokeless tobacco is usually flavored, sweetened, or processed in some way. Although smokeless tobacco is not smoked into the lungs, its chemicals are absorbed through the membranes in the mouth and into the bloodstream. Its chemicals are also swallowed in saliva. The chemicals (nicotine and other toxins) are known to cause cancer. Smokeless tobacco contains up to 28 differentcarcinogens. CAUSES Nicotine is addictive. Smokeless tobacco contains nicotine, which is a stimulant. This stimulant can give you a "buzz" or altered state. People can become addicted to the feeling it delivers.  SYMPTOMS Smokeless tobacco can cause health problems, including:  Bad breath.  Yellow-brown teeth.  Mouth sores.  Cracking and bleeding lips.  Gum disease, gum recession, and bone loss around the teeth.  Tooth decay.  Increased or irregular heart rate.  High blood pressure, heart disease, and stroke.  Cancer of the mouth, lips, tongue, pancreas, voice box (larynx), esophagus, colon, and bladder.  Precancerous lesion of the soft tissues of the mouth (leukoplakia).  Loss of your sense of taste. TREATMENT Talk with your caregiver about  ways you can quit. Quitting tobacco is a good decision for your health. Nicotine is addictive, but several options are available to help you quit including:  Nicotine replacement therapy (gum or patch).  Support and cessation programs. The following tips can help you quit:  Write down the reasons you would like to quit and look at them often.  Set a date during a low stress time to stop or cut back.  Ask family and friends for their support.  Remove all tobacco products from your home and work.  Replace the chewing tobacco with things like beef jerky, sunflower seeds, or shredded coconut.  Avoid situations that may make you want to chew tobacco.  Exercise and eat a healthy diet.  When you crave tobacco, distract yourself with drinking water, sugarless chewing gum, sugarless hard candy, exercising, or deep breathing. HOME CARE INSTRUCTIONS  See your dentist for regular oral health exams every 6 months.  Follow up with your caregiver as recommended. SEEK MEDICAL CARE OR DENTAL CARE IF:  You have bleeding or cracking lips, gums, or cheeks.  You have mouth sores, discolorations, or pain.  You have tooth pain.  You develop persistent irritation, burning, or sores in the mouth.  You have pain, tenderness, or numbness in the mouth.  You develop a lump, bumpy patch, or hardened skin inside the mouth.  The color changes inside your mouth (gray, white, or red spots).  You have difficulty chewing, swallowing, or speaking. Document Released: 02/02/2011 Document Revised: 11/23/2011 Document Reviewed: 02/02/2011 [facility] Patient Information 2014 [place], [place].

## 2014-02-12 NOTE — Assessment & Plan Note (Signed)
Fasting lipid panel and LFTs in 4 weeks

## 2014-02-12 NOTE — Progress Notes (Signed)
HPI: This is a 43 year old male patient who suffered a STEMI on 01/23/14 treated with a drug-eluting stent to the LAD. There was minor nonobstructive disease in the left main, circumflex and RCA. He had mild as LV systolic dysfunction and moderate elevated LVEDP. EF was 45%. He was treated with Effient and aspirin. He was also treated with Coreg but blood pressure did not allow for an ACE inhibitor.  Patient is doing pretty well. He is walking 25-30 minutes a day and gets a little short of breath at the end of his walk. He denies any chest pain. He does have anxiety since he's trying to quit smoking and gave up [facility] and salt. He is smoking about one cigarette daily. Is difficult because his wife is a smoker.  Allergies -- Ibuprofen -- Hives  Current Outpatient Prescriptions on File Prior to Visit: aspirin 81 MG chewable tablet, Chew 1 tablet (81 mg total) by mouth daily., Disp: , Rfl:  atorvastatin (LIPITOR) 80 MG tablet, Take 1 tablet (80 mg total) by mouth daily at 6 PM., Disp: 30 tablet, Rfl: 5 carvedilol (COREG) 3.125 MG tablet, Take 1 tablet (3.125 mg total) by mouth 2 (two) times daily with a meal., Disp: 60 tablet, Rfl: 5 nitroGLYCERIN (NITROSTAT) 0.4 MG SL tablet, Place 1 tablet (0.4 mg total) under the tongue every 5 (five) minutes x 3 doses as needed for chest pain., Disp: 25 tablet, Rfl: 2 prasugrel (EFFIENT) 10 MG TABS tablet, Take 1 tablet (10 mg total) by mouth daily., Disp: 30 tablet, Rfl: 10 prasugrel (EFFIENT) 10 MG TABS tablet, Take 1 tablet (10 mg total) by mouth daily., Disp: 30 tablet, Rfl: 0  No current facility-administered medications on file prior to visit.   Past Medical History:   Tobacco abuse                                                Medical history non-contributory                            Past Surgical History:   NO PAST SURGERIES                                            No family history on file.   Social History   Marital Status: Married              Spouse Name:                      Years of Education:                 Number of children:             Occupational History   None on file  Social History Main Topics   Smoking Status: Current Every Day Smoker        Packs/Day: 0.50  Years: 30        Types: Cigarettes   Smokeless Status: Not on file                      Alcohol Use: No  Drug Use: No             Sexual Activity: Not on file        Other Topics            Concern   None on file  Social History Narrative   The patient is married. He works as a Surveyor, [employer] for [employer]. He does not drink alcohol or use illicit drugs. He does smoke cigarettes one half pack per day.    ROS: See history of present illness otherwise negative   PHYSICAL EXAM: Well-nournished, in no acute distress. Neck: No JVD, HJR, Bruit, or thyroid enlargement  Lungs: No tachypnea, clear without wheezing, rales, or rhonchi  Cardiovascular: RRR, PMI not displaced, positive S4, no murmurs, gallops, bruit, thrill, or heave.  Abdomen: BS normal. Soft without organomegaly, masses, lesions or tenderness.  Extremities: Right arm without hematoma or hemorrhage at cath site good radial and brachial pulses, lower extremities without cyanosis, clubbing or edema. Good distal pulses bilateral  SKin: Warm, no lesions or rashes   Musculoskeletal: No deformities  Neuro: no focal signs  BP 115/58  Pulse 61  Ht 5\' 9"  (1.753 m)  Wt 174 lb 1.9 oz (78.98 kg)  BMI 25.70 kg/m2    EKG: Normal sinus rhythm with marked T-wave inversion anterior lateral  Cardiac catheterization Left mainstem: The left mainstem is patent. There is no significant obstructive disease. The left main divides into the LAD and left circumflex.  Left anterior descending (LAD): The proximal LAD has minor irregularities. The first diagonal bifurcates at its origin and essentially supplies a ramus intermedius territory. There is a 30-40% proximal stenosis. Just  after the first septal perforator, the LAD is totally occluded with TIMI 0 flow.  Left circumflex (LCx): The left circumflex is small to medium in caliber and covers a small distribution. There are 2 small obtuse marginal branches without significant disease.  Right coronary artery (RCA): Large, dominant vessel. There are diffuse luminal irregularities without significant stenosis. The PDA branch is large with mild 20-30% proximal stenosis. The first and second posterolateral branches are moderate in caliber without significant stenosis. There is collateral filling of the apical LAD.  Left ventriculography: Left ventricular systolic function is mildly depressed. There is severe hypokinesis of the mid and distal anterior walls as well as the apex. The basal anterior and basal and mid-inferior walls are hyperdynamic. The estimated LVEF is 45%.  PCI Note:  Following the diagnostic procedure, the decision was made to proceed with PCI. The patient had received aspirin 324 mg and heparin 4000 units in the emergency department. He was loaded with effient 60 mg on the table. Weight-based bivalirudin was given for anticoagulation. Once a therapeutic ACT was achieved, a 6 [place] XB LAD guide catheter was inserted.  A cougar coronary guidewire was used to cross the lesion.  The lesion was predilated with a 2.0 x 15 mm balloon.  The lesion was then stented with a 2.75 x 22 mm resolute drug-eluting stent.  The stent was postdilated with a 3.25 mm noncompliant balloon to 14 atmospheres.  Following PCI, there was 0% residual stenosis and TIMI-3 flow. Final angiography confirmed an excellent result. The patient tolerated the procedure well. He was chest pain-free at the completion of the procedure There were no immediate procedural complications. A TR band was used for radial hemostasis. The patient was transferred to the post catheterization recovery area for further monitoring.  PCI Data: Vessel - LAD/Segment -  mid  Percent Stenosis (pre)  100   TIMI-flow 0 Stent 2.75x22 mm Resolute DES (post-dil to > 3.25) Percent Stenosis (post) 0 TIMI-flow (post) 3  Final Conclusions:   1. Total occlusion of the LAD after the first septal perforator, treated successfully with primary PCI using a DES platform 2. Minor nonobstructive stenosis of the LM, LCx, and RCA 3. Mild segmental LV systolic dysfunction with moderately elevated LVEDP   Recommendations:  Initiate post-MI med Rx with carvedilol, ASA, Effient, high-dose atorvastatin. Start low-dose ACE in am if BP will allow. Tobacco cessation counseling. Anticipate tx out of ICU tomorrow if stable and discharge on hospital day #2.  Rebecca Motta 01/23/2014, 9:55 PM

## 2014-02-12 NOTE — Assessment & Plan Note (Addendum)
Patient had a STEMI treated with drug-eluting stent to the LAD. He is doing well without chest pain. He was given samples of Effient and needs more. We have provided this. He is trying to male and his taxes to get assistance with this. He may return to work at [employer] on [employer] duty. Increase Coreg to 6.25 mg twice a day .Followup with Dr. Excell Seltzer in 2 months

## 2014-03-14 ENCOUNTER — Other Ambulatory Visit: Payer: Self-pay

## 2014-03-19 ENCOUNTER — Other Ambulatory Visit (INDEPENDENT_AMBULATORY_CARE_PROVIDER_SITE_OTHER): Payer: Self-pay

## 2014-03-19 DIAGNOSIS — E785 Hyperlipidemia, unspecified: Secondary | ICD-10-CM

## 2014-03-19 LAB — HEPATIC FUNCTION PANEL
ALT: 30 U/L (ref 0–53)
AST: 23 U/L (ref 0–37)
Albumin: 3.8 g/dL (ref 3.5–5.2)
Alkaline Phosphatase: 104 U/L (ref 39–117)
Bilirubin, Direct: 0 mg/dL (ref 0.0–0.3)
TOTAL PROTEIN: 7.3 g/dL (ref 6.0–8.3)
Total Bilirubin: 0.3 mg/dL (ref 0.2–1.2)

## 2014-03-19 LAB — LIPID PANEL
CHOLESTEROL: 105 mg/dL (ref 0–200)
HDL: 29.4 mg/dL — ABNORMAL LOW (ref >39.00)
LDL Cholesterol: 56 mg/dL (ref 0–99)
NonHDL: 75.6
TRIGLYCERIDES: 98 mg/dL (ref 0.0–149.0)
Total CHOL/HDL Ratio: 4
VLDL: 19.6 mg/dL (ref 0.0–40.0)

## 2014-03-23 ENCOUNTER — Encounter: Payer: Self-pay | Admitting: Cardiovascular Disease

## 2014-03-23 ENCOUNTER — Ambulatory Visit (INDEPENDENT_AMBULATORY_CARE_PROVIDER_SITE_OTHER): Payer: Self-pay | Admitting: Cardiovascular Disease

## 2014-03-23 VITALS — BP 122/62 | HR 66 | Ht 69.0 in | Wt 175.0 lb

## 2014-03-23 DIAGNOSIS — I2510 Atherosclerotic heart disease of native coronary artery without angina pectoris: Secondary | ICD-10-CM

## 2014-03-23 NOTE — Patient Instructions (Signed)
Your physician recommends that you continue on your current medications as directed. Please refer to the Current Medication list given to you today.  Your physician recommends that you schedule a follow-up appointment in: 3 month ov with Dawayne Patricia NP or Bing Neighbors PA  Continue light duty at work until after follow up appointment

## 2014-03-23 NOTE — Progress Notes (Signed)
HPI:  43 year old gentleman presenting for followup after presenting with a non-ST elevation infarction in may 2015. The patient was found to have total occlusion of his LAD and was treated with a drug-eluting stent platform. He had nonobstructive disease of the left main, left circumflex, and right coronary arteries. LVEF was in the range of 45-50%. The patient has been treated with aspirin and effient. He was started on carvedilol. Blood pressure was too low to add an ACE inhibitor.  The patient is back to work for [employer]. He is working with light duty restrictions. He complains of fatigue in the afternoon. He has some lightheadedness. He has had some fleeting chest pains that just last a few seconds. He has not had any chest pressure or exertional symptoms. He denies edema, orthopnea, or PND. He has cut back from 1-1/2 packs of cigarettes daily down to just a few cigarettes per day. His diet is much better and he has quit eating fast food.  Outpatient Encounter Prescriptions as of 03/23/2014  Medication Sig  . aspirin 81 MG chewable tablet Chew 1 tablet (81 mg total) by mouth daily.  Marland Kitchen atorvastatin (LIPITOR) 80 MG tablet Take 1 tablet (80 mg total) by mouth daily at 6 PM.  . carvedilol (COREG) 6.25 MG tablet Take 1 tablet (6.25 mg total) by mouth 2 (two) times daily with a meal.  . nitroGLYCERIN (NITROSTAT) 0.4 MG SL tablet Place 1 tablet (0.4 mg total) under the tongue every 5 (five) minutes x 3 doses as needed for chest pain.  . prasugrel (EFFIENT) 10 MG TABS tablet Take 1 tablet (10 mg total) by mouth daily.    Allergies  Allergen Reactions  . Ibuprofen Hives    Past Medical History  Diagnosis Date  . Tobacco abuse   . Medical history non-contributory     ROS: Negative except as per HPI  BP 122/62  Pulse 66  Ht 5\' 9"  (1.753 m)  Wt 79.379 kg (175 lb)  BMI 25.83 kg/m2  PHYSICAL EXAM: Pt is alert and oriented, NAD HEENT: normal Neck: JVP - normal, carotids 2+=  without bruits Lungs: CTA bilaterally CV: RRR without murmur or gallop Abd: soft, NT, Positive BS, no hepatomegaly Ext: no C/C/E, distal pulses intact and equal Skin: warm/dry no rash  Last lipids: Lipid Panel     Component Value Date/Time   CHOL 105 03/19/2014 1116   TRIG 98.0 03/19/2014 1116   HDL 29.40* 03/19/2014 1116   CHOLHDL 4 03/19/2014 1116   VLDL 19.6 03/19/2014 1116   LDLCALC 56 03/19/2014 1116   ASSESSMENT AND PLAN: 1. Coronary artery disease, native vessel with anterior MI approximately 8 weeks ago. The patient will continue on aspirin and effient. He was given effient samples today. I stressed the importance of continued adherence to dual antiplatelet therapy. He was advised to call the office if he is running out of effient and we will try to give him additional samples. If this is not possible, I will switch him to clopidogrel. He will continue on carvedilol at the current dose. With lightheadedness and fatigue, I do not think he will tolerate addition of an ACE inhibitor at this point. I would like him to come back in 3 months for followup. Will extend his light duty at work until he is seen back in 3 months.  2. Tobacco use. We discussed the importance of complete cessation. He is down to just a few cigarettes daily.  3. Hyperlipidemia. Lipid panel reviewed  and it is at goal on statin therapy.  Tonny BollmanMichael Jhalen Eley 03/23/2014 9:28 AM

## 2014-04-24 ENCOUNTER — Telehealth: Payer: Self-pay | Admitting: Cardiovascular Disease

## 2014-04-24 NOTE — Telephone Encounter (Signed)
I spoke with the pt and made him aware that Dr Excell Seltzer does not prescribe this class of drug. I advised the pt that he needs to establish with a PCP for evaluation and management of stress/anxiety.

## 2014-04-24 NOTE — Telephone Encounter (Signed)
° °  Patient would like to have something called in for anxiety/stress. Please call into [facility]  [phone]. Patient stated that he feels like he's ready to explode. Please call and advise.

## 2014-05-02 ENCOUNTER — Emergency Department: Payer: Self-pay | Admitting: Emergency Medicine

## 2014-05-22 ENCOUNTER — Telehealth: Payer: Self-pay | Admitting: *Deleted

## 2014-05-22 NOTE — Telephone Encounter (Signed)
Effient samples placed at the front desk for patient. 

## 2014-06-18 ENCOUNTER — Encounter: Payer: Self-pay | Admitting: *Deleted

## 2014-06-21 ENCOUNTER — Encounter: Payer: Self-pay | Admitting: Physician Assistant

## 2014-06-21 ENCOUNTER — Ambulatory Visit (INDEPENDENT_AMBULATORY_CARE_PROVIDER_SITE_OTHER): Payer: Self-pay | Admitting: Physician Assistant

## 2014-06-21 VITALS — BP 118/70 | HR 65 | Ht 69.0 in | Wt 173.0 lb

## 2014-06-21 DIAGNOSIS — Z720 Tobacco use: Secondary | ICD-10-CM

## 2014-06-21 DIAGNOSIS — I2510 Atherosclerotic heart disease of native coronary artery without angina pectoris: Secondary | ICD-10-CM

## 2014-06-21 DIAGNOSIS — I255 Ischemic cardiomyopathy: Secondary | ICD-10-CM

## 2014-06-21 DIAGNOSIS — E785 Hyperlipidemia, unspecified: Secondary | ICD-10-CM

## 2014-06-21 MED ORDER — CLOPIDOGREL BISULFATE 75 MG PO TABS: 75.0000 mg | ORAL_TABLET | Freq: Every day | ORAL | Status: DC

## 2014-06-21 MED ORDER — CLOPIDOGREL BISULFATE 75 MG PO TABS: 75.0000 mg | ORAL_TABLET | Freq: Every day | ORAL | Status: AC

## 2014-06-21 NOTE — Progress Notes (Addendum)
Cardiology Office Note   Date:  06/21/2014   ID:  Sender Starr, DOB April 17, 1971, MRN [id number]  PCP:  Lonni Fix, PA-C at [facility] [place], [place]) Cardiologist:  Dr. Tonny Bollman    Chief Complaint  Patient presents with  . Follow-up    3 month FU      History of Present Illness: Kenneth Medina is a 43 y.o. male with a hx of CAD s/p anterior STEMI treated with DES to mid LAD 01/2014, ischemic CM, HL.  He was last seen by Dr. Tonny Bollman in 7/15.  He returns for FU.  The patient denies chest pain, shortness of breath, syncope, orthopnea, PND or significant pedal edema.    Studies:  - LHC (5/15):  prox D1 30-40%, LAD occluded, prox PDA 20-30%, mid to dist Ant HK, EF 45% >>> PCI:  2.75x22 mm Resolute DES to mid LAD  - Echo (5/15):  Apical HK, no clot, EF 50%, mod LVH, normal RVF   Recent Labs/Images:  Recent Labs  01/24/14 0434 01/25/14 0458 03/19/14 1116  NA 142  --   --   K 4.0  --   --   BUN 9  --   --   CREATININE 0.78  --   --   ALT  --   --  30  HGB 14.4 15.0  --   TSH 0.595  --   --   LDLCALC 129*  --  56  HDL 22*  --  29.40*  PROBNP 185.8*  --   --        Wt Readings from Last 3 Encounters:  06/21/14 173 lb (78.472 kg)  03/23/14 175 lb (79.379 kg)  02/12/14 174 lb 1.9 oz (78.98 kg)     Past Medical History  Diagnosis Date  . Tobacco abuse   . Coronary artery disease     a. s/p ant STEMI 5/15 >> LHC (5/15):  prox D1 30-40%, LAD occluded, prox PDA 20-30%, mid to dist Ant HK, EF 45% >>> PCI:  2.75x22 mm Resolute DES to mid LAD  . Ischemic cardiomyopathy     a. EF 45% at [facility] at time of MI >> b. Echo (5/15):  Apical HK, no clot, EF 50%, mod LVH, normal RVF  . Hyperlipidemia     Current Outpatient Prescriptions  Medication Sig Dispense Refill  . aspirin 81 MG chewable tablet Chew 1 tablet (81 mg total) by mouth daily.      Marland Kitchen atorvastatin (LIPITOR) 80 MG tablet Take 1 tablet (80 mg total) by mouth daily at 6 PM.   30 tablet  5  . carvedilol (COREG) 6.25 MG tablet Take 1 tablet (6.25 mg total) by mouth 2 (two) times daily with a meal.  60 tablet  5  . nitroGLYCERIN (NITROSTAT) 0.4 MG SL tablet Place 1 tablet (0.4 mg total) under the tongue every 5 (five) minutes x 3 doses as needed for chest pain.  25 tablet  2  . prasugrel (EFFIENT) 10 MG TABS tablet Take 1 tablet (10 mg total) by mouth daily.  30 tablet  10   No current facility-administered medications for this visit.     Allergies:   Ibuprofen   Social History:  The patient  reports that he has been smoking Cigarettes.  He has a 15 pack-year smoking history. He does not have any smokeless tobacco history on file. He reports that he does not drink alcohol or use illicit drugs.   Family History:  The patient's family history includes Cancer in his maternal grandfather and maternal grandmother; Hypertension in his mother. There is no history of Heart attack or Stroke.   ROS:  Please see the history of present illness.      All other systems reviewed and negative.    PHYSICAL EXAM: VS:  BP 118/70  Pulse 65  Ht 5\' 9"  (1.753 m)  Wt 173 lb (78.472 kg)  BMI 25.54 kg/m2 Well nourished, well developed, in no acute distress HEENT: normal Neck: no JVD Cardiac:  normal S1, S2; RRR; no murmur Lungs:  clear to auscultation bilaterally, no wheezing, rhonchi or rales Abd: soft, nontender, no hepatomegaly Ext: no edema Skin: warm and dry Neuro:  CNs 2-12 intact, no focal abnormalities noted  EKG:  NSR, HR 65, normal axis, no ST changes      ASSESSMENT AND PLAN:  1.  Coronary artery disease:  He is doing well. No angina.  Continue on ASA, Effient, statin, beta blocker.  [place] now [place].  He can get Plavix there very cheap. I will give him samples of Effient to get him 6 months out from his ACS.  I will give him Plavix 75 mg QD to start once he runs out of Effient.    2.  Ischemic Cardiomyopathy:  EF improved to 50%  by echo after MI.  He has had soft BP and I do not think he can tolerate ACEI.  Continue beta blocker.  No signs or symptoms of volume excess.    3.  Hyperlipidemia:  Continue statin.  Recent LDL ok.   4.  Tobacco use:  We discussed different strategies for quitting.  I have recommended complete cessation.     Disposition:   FU with Dr. Tonny BollmanMichael Cooper 6 mos.  He is doing well and is ready to return to full duty.  I will give him a letter to return to full duty at work now.    Signed, Brynda RimScott Kristyana Notte, PA-C, MHS 06/21/2014 10:35 AM    [facility] [address], [place], [place]  [address] Phone: [phone]; Fax: [phone]

## 2014-06-21 NOTE — Patient Instructions (Signed)
We gave you some sample of Effient today.  Take Effient until it runs out. Then, you can start on Plavix 75 mg daily. Schedule a follow up with Dr. Tonny Bollman in 6 months.

## 2014-08-23 ENCOUNTER — Encounter (HOSPITAL_COMMUNITY): Payer: Self-pay | Admitting: Cardiovascular Disease

## 2015-06-25 NOTE — Progress Notes (Signed)
Cardiology Office Note   Date:  06/26/2015   ID:  Kenneth Medina, DOB 01-Jul-1971, MRN [id number]  PCP:   Lonni Fix, PA-C at [facility] [place], [place])  Cardiologist:  Dr. Tonny Bollman   Electrophysiologist:  n/a  Chief Complaint  Patient presents with  . Follow-up  . Coronary Artery Disease     History of Present Illness: Kenneth Medina is a 44 y.o. male with a hx of CAD s/p anterior STEMI treated with DES to mid LAD 01/2014, ischemic CM, HL.  Last seen in clinic in 10/15.  Returns for FU.  He continues to smoke. He never went to cardiac rehab.  He does try to walk several times a week.  He recently switched jobs. 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  He is under a lot of stress.  He notes occasional chest pains.  These are L sided and sharp.  They only last seconds. It occurs at rest or with activity.  He can exert himself without symptoms.  He denies assoc symptoms.  He notes dyspnea with bending over.  He gets lightheaded with sitting up quickly.  He denies syncope. He denies orthopnea, PND.  He has some mild edema without change.  He notes some mild DOE.  He is NYHA 2.  He does snore. He is not sure if his wife has witnessed apnea.  He has a NP cough. No fever.     Studies/Reports Reviewed Today:  - LHC (5/15): prox D1 30-40%, LAD occluded, prox PDA 20-30%, mid to dist Ant HK, EF 45% >>> PCI: 2.75x22 mm Resolute DES to mid LAD - Echo (5/15): Apical HK, no clot, EF 50%, mod LVH, normal RVF   Past Medical History  Diagnosis Date  . Tobacco abuse   . Coronary artery disease     a. s/p ant STEMI 5/15 >> LHC (5/15):  prox D1 30-40%, LAD occluded, prox PDA 20-30%, mid to dist Ant HK, EF 45% >>> PCI:  2.75x22 mm Resolute DES to mid LAD  . Ischemic cardiomyopathy     a. EF 45% at [facility] at time of MI >> b. Echo (5/15):  Apical HK, no clot, EF 50%, mod LVH, normal RVF  . Hyperlipidemia     Past Surgical History  Procedure Laterality Date  .  No past surgeries    . Left heart catheterization with coronary angiogram N/A 01/23/2014    Procedure: LEFT HEART CATHETERIZATION WITH CORONARY ANGIOGRAM;  Surgeon: Micheline Chapman, MD;  Location: [facility] CATH LAB;  Service: Cardiovascular;  Laterality: N/A;     Current Outpatient Prescriptions  Medication Sig Dispense Refill  . aspirin 81 MG chewable tablet Chew 1 tablet (81 mg total) by mouth daily.    Marland Kitchen atorvastatin (LIPITOR) 80 MG tablet Take 1 tablet (80 mg total) by mouth daily at 6 PM. 30 tablet 5  . carvedilol (COREG) 6.25 MG tablet Take 1 tablet (6.25 mg total) by mouth 2 (two) times daily with a meal. 60 tablet 5  . nitroGLYCERIN (NITROSTAT) 0.4 MG SL tablet Place 1 tablet (0.4 mg total) under the tongue every 5 (five) minutes x 3 doses as needed for chest pain. 25 tablet 2  . clopidogrel (PLAVIX) 75 MG tablet Take 1 tablet (75 mg total) by mouth daily. 30 tablet 11  . lisinopril (PRINIVIL,ZESTRIL) 2.5 MG tablet Take 1 tablet (2.5 mg total) by mouth daily. 30 tablet 11   No current facility-administered medications for this visit.  Allergies:   Ibuprofen    Social History:  The patient  reports that he has been smoking Cigarettes.  He has a 15 pack-year smoking history. He does not have any smokeless tobacco history on file. He reports that he does not drink alcohol or use illicit drugs.   Family History:  The patient's family history includes Cancer in his maternal grandfather and maternal grandmother; Hypertension in his mother. There is no history of Heart attack or Stroke.    ROS:   Please see the history of present illness.   Review of Systems  Cardiovascular: Positive for chest pain, dyspnea on exertion and irregular heartbeat.  Respiratory: Positive for cough and snoring.   Psychiatric/Behavioral: The patient is nervous/anxious.   All other systems reviewed and are negative.     PHYSICAL EXAM: VS:  BP 120/74 mmHg  Pulse 68  Ht 5\' 9"  (1.753 m)  Wt 189 lb 6.4 oz  (85.911 kg)  BMI 27.96 kg/m2  SpO2 96%    Wt Readings from Last 3 Encounters:  06/26/15 189 lb 6.4 oz (85.911 kg)  06/21/14 173 lb (78.472 kg)  03/23/14 175 lb (79.379 kg)     GEN: Well nourished, well developed, in no acute distress HEENT: normal Neck: no JVD,   no masses Cardiac:  Normal S1/S2, RRR; no murmur ,  no rubs or gallops, no edema   Respiratory:  clear to auscultation bilaterally, no wheezing, rhonchi or rales. GI: soft, nontender, nondistended, + BS MS: no deformity or atrophy Skin: warm and dry  Neuro:  CNs II-XII intact, Strength and sensation are intact Psych: Normal affect   EKG:  EKG is ordered today.  It demonstrates:   NSR, HR 68, normal axis, no ST changes, QTc 423 ms, no change since prior tracing.    Recent Labs: No results found for requested labs within last 365 days.    Lipid Panel    Component Value Date/Time   CHOL 105 03/19/2014 1116   TRIG 98.0 03/19/2014 1116   HDL 29.40* 03/19/2014 1116   CHOLHDL 4 03/19/2014 1116   VLDL 19.6 03/19/2014 1116   LDLCALC 56 03/19/2014 1116      ASSESSMENT AND PLAN:  1.Coronary artery disease:  He is overall doing well.  He has had some issues with atypical chest pain recently as well as mild dyspnea. His ECG is normal.  He continues to smoke. He never did cardiac rehab.  I think he would benefit from prolonged dual antiplatelet Rx beyond 1 year post MI.  He is > 12 mos out from his MI/PCI.  I will stop his Effient and start Plavix 75 mg QD. Continue on ASA, statin, beta blocker.  Arrange plain ETT to assess for ischemia.   2.Ischemic Cardiomyopathy: EF improved to 50% by echo after MI. continue beta-blocker.  BP could now tolerate an ACE inhibitor.  Will start Lisinopril 2.5 mg QD.  Check BMET 1 week.   3.Hyperlipidemia: Continue statin.LDL in 7/15:  56.  Arrange FU Lipids and LFTs.      4. Tobacco abuse: we again discussed the importance of DC cigs.   5. Snoring:  He has symptoms of probable  OSA.  He has no insurance. Once he gets coverage, we should try to arrange a sleep test.   6. Anxiety:  He has significant problems with stress.  He has no PCP. Refer to [facility] clinic.        Medication Changes: Current medicines are reviewed at length with the  patient today.  Concerns regarding medicines are as outlined above.  The following changes have been made:   Discontinued Medications   PRASUGREL (EFFIENT) 10 MG TABS TABLET    Take 1 tablet (10 mg total) by mouth daily.   Modified Medications   Modified Medication Previous Medication   ATORVASTATIN (LIPITOR) 80 MG TABLET atorvastatin (LIPITOR) 80 MG tablet      Take 1 tablet (80 mg total) by mouth daily at 6 PM.    Take 1 tablet (80 mg total) by mouth daily at 6 PM.   CARVEDILOL (COREG) 6.25 MG TABLET carvedilol (COREG) 6.25 MG tablet      Take 1 tablet (6.25 mg total) by mouth 2 (two) times daily with a meal.    Take 1 tablet (6.25 mg total) by mouth 2 (two) times daily with a meal.   New Prescriptions   CLOPIDOGREL (PLAVIX) 75 MG TABLET    Take 1 tablet (75 mg total) by mouth daily.   LISINOPRIL (PRINIVIL,ZESTRIL) 2.5 MG TABLET    Take 1 tablet (2.5 mg total) by mouth daily.   Labs/ tests ordered today include:   Orders Placed This Encounter  Procedures  . Basic Metabolic Panel (BMET)  . Hepatic function panel  . Lipid Profile  . Ambulatory referral to Internal Medicine  . Exercise Tolerance Test  . EKG 12-Lead      Disposition:    FU with Dr. Tonny Bollman 6 mos.     Signed, Brynda Rim, MHS 06/26/2015 1:05 PM    [facility] [address] [place], [place], [place]  [address] Phone: [phone]; Fax: [phone]

## 2015-06-26 ENCOUNTER — Encounter: Payer: Self-pay | Admitting: Physician Assistant

## 2015-06-26 ENCOUNTER — Ambulatory Visit (INDEPENDENT_AMBULATORY_CARE_PROVIDER_SITE_OTHER): Payer: Self-pay | Admitting: Physician Assistant

## 2015-06-26 VITALS — BP 120/74 | HR 68 | Ht 69.0 in | Wt 189.4 lb

## 2015-06-26 DIAGNOSIS — Z720 Tobacco use: Secondary | ICD-10-CM

## 2015-06-26 DIAGNOSIS — R0683 Snoring: Secondary | ICD-10-CM

## 2015-06-26 DIAGNOSIS — E785 Hyperlipidemia, unspecified: Secondary | ICD-10-CM

## 2015-06-26 DIAGNOSIS — I255 Ischemic cardiomyopathy: Secondary | ICD-10-CM

## 2015-06-26 DIAGNOSIS — I2510 Atherosclerotic heart disease of native coronary artery without angina pectoris: Secondary | ICD-10-CM

## 2015-06-26 DIAGNOSIS — F419 Anxiety disorder, unspecified: Secondary | ICD-10-CM

## 2015-06-26 DIAGNOSIS — R079 Chest pain, unspecified: Secondary | ICD-10-CM

## 2015-06-26 MED ORDER — ATORVASTATIN CALCIUM 80 MG PO TABS: 80.0000 mg | ORAL_TABLET | Freq: Every day | ORAL | Status: DC

## 2015-06-26 MED ORDER — CLOPIDOGREL BISULFATE 75 MG PO TABS: 75.0000 mg | ORAL_TABLET | Freq: Every day | ORAL | Status: DC

## 2015-06-26 MED ORDER — CARVEDILOL 6.25 MG PO TABS: 6.2500 mg | ORAL_TABLET | Freq: Two times a day (BID) | ORAL | Status: DC

## 2015-06-26 MED ORDER — LISINOPRIL 2.5 MG PO TABS: 2.5000 mg | ORAL_TABLET | Freq: Every day | ORAL | Status: DC

## 2015-06-26 NOTE — Patient Instructions (Addendum)
Medication Instructions:  1. STOP EFFIENT  2. START PLAVIX 75 MG DAILY; RX SENT  3. REFILLS SENT IN FOR COREG, LIPITOR  4. START LISINOPRIL 2.5 MG DAILY; RX SENT  Labwork: 1 WEEK FOR BMET, FASTING LIPID AND LIVER PANEL  Testing/Procedures: Your physician has requested that you have an exercise tolerance test. For further information please visit [email or web address] Please also follow instruction sheet, as given.   Follow-Up: Your physician wants you to follow-up in: 6 MONTHS WITH DR. Theodoro Parma will receive a reminder letter in the mail two months in advance. If you don't receive a letter, please call our office to schedule the follow-up appointment.  YOU [facility] BEING [facility]FERRED TO [facility] Any Other Special Instructions Will Be Listed Below (If Applicable).

## 2015-06-30 ENCOUNTER — Encounter: Payer: Self-pay | Admitting: Emergency Medicine

## 2015-06-30 ENCOUNTER — Emergency Department
Admission: EM | Admit: 2015-06-30 | Discharge: 2015-06-30 | Disposition: A | Discharge status: Home / Self Care | Payer: Self-pay | Attending: Emergency Medicine | Admitting: Emergency Medicine

## 2015-06-30 DIAGNOSIS — Z720 Tobacco use: Secondary | ICD-10-CM | POA: Insufficient documentation

## 2015-06-30 DIAGNOSIS — Z7902 Long term (current) use of antithrombotics/antiplatelets: Secondary | ICD-10-CM | POA: Insufficient documentation

## 2015-06-30 DIAGNOSIS — M545 Low back pain, unspecified: Secondary | ICD-10-CM

## 2015-06-30 DIAGNOSIS — I10 Essential (primary) hypertension: Secondary | ICD-10-CM | POA: Insufficient documentation

## 2015-06-30 DIAGNOSIS — Z79899 Other long term (current) drug therapy: Secondary | ICD-10-CM | POA: Insufficient documentation

## 2015-06-30 DIAGNOSIS — Z7982 Long term (current) use of aspirin: Secondary | ICD-10-CM | POA: Insufficient documentation

## 2015-06-30 HISTORY — DX: Essential (primary) hypertension: I10

## 2015-06-30 MED ORDER — CYCLOBENZAPRINE HCL 10 MG PO TABS: 10.0000 mg | ORAL_TABLET | Freq: Three times a day (TID) | ORAL | Status: DC | PRN

## 2015-06-30 MED ORDER — HYDROCODONE-ACETAMINOPHEN 5-325 MG PO TABS: 1.0000 | ORAL_TABLET | Freq: Four times a day (QID) | ORAL | Status: DC | PRN

## 2015-06-30 NOTE — ED Notes (Signed)
Pt to ED from home c/o back pain that started yesterday after waking up.  Pt states pain is in the middle of back and radiates to lower back.  States injury to back several years ago but nothing recently.  Pt took 81 ASA at home today and 500mg  tylenol around 1500 today with no relief.  Pt is A&Ox4, speaking in complete and coherent sentences and in NAD at this time.

## 2015-06-30 NOTE — Discharge Instructions (Signed)
You were evaluated for low back pain, and your found have muscle spasm of the low back. I suspect your back pain is due to musculoskeletal source. We discussed return to the emergency department for any fever, worsening pain, weakness, numbness, urinating on yourself, stooling on yourself,or any other symptoms concerning to you.  Follow-up with a primary care physician, or [facility] in one week.   Back Pain, Adult Back pain is very common in adults.The cause of back pain is rarely dangerous and the pain often gets better over time.The cause of your back pain may not be known. Some common causes of back pain include:  Strain of the muscles or ligaments supporting the spine.  Wear and tear (degeneration) of the spinal disks.  Arthritis.  Direct injury to the back. For many people, back pain may return. Since back pain is rarely dangerous, most people can learn to manage this condition on their own. HOME CARE INSTRUCTIONS Watch your back pain for any changes. The following actions may help to lessen any discomfort you are feeling:  Remain active. It is stressful on your back to sit or stand in one place for long periods of time. Do not sit, drive, or stand in one place for more than 30 minutes at a time. Take short walks on even surfaces as soon as you are able.Try to increase the length of time you walk each day.  Exercise regularly as directed by your health care provider. Exercise helps your back heal faster. It also helps avoid future injury by keeping your muscles strong and flexible.  Do not stay in bed.Resting more than 1-2 days can delay your recovery.  Pay attention to your body when you bend and lift. The most comfortable positions are those that put less stress on your recovering back. Always use proper lifting techniques, including:  Bending your knees.  Keeping the load close to your body.  Avoiding twisting.  Find a comfortable position to sleep. Use a firm  mattress and lie on your side with your knees slightly bent. If you lie on your back, put a pillow under your knees.  Avoid feeling anxious or stressed.Stress increases muscle tension and can worsen back pain.It is important to recognize when you are anxious or stressed and learn ways to manage it, such as with exercise.  Take medicines only as directed by your health care provider. Over-the-counter medicines to reduce pain and inflammation are often the most helpful.Your health care provider may prescribe muscle relaxant drugs.These medicines help dull your pain so you can more quickly return to your normal activities and healthy exercise.  Apply ice to the injured area:  Put ice in a plastic bag.  Place a towel between your skin and the bag.  Leave the ice on for 20 minutes, 2-3 times a day for the first 2-3 days. After that, ice and heat may be alternated to reduce pain and spasms.  Maintain a healthy weight. Excess weight puts extra stress on your back and makes it difficult to maintain good posture. SEEK MEDICAL CARE IF:  You have pain that is not relieved with rest or medicine.  You have increasing pain going down into the legs or buttocks.  You have pain that does not improve in one week.  You have night pain.  You lose weight.  You have a fever or chills. SEEK IMMEDIATE MEDICAL CARE IF:   You develop new bowel or bladder control problems.  You have unusual weakness or numbness  in your arms or legs.  You develop nausea or vomiting.  You develop abdominal pain.  You feel faint.   This information is not intended to replace advice given to you by your health care provider. Make sure you discuss any questions you have with your health care provider.   Document Released: 08/31/2005 Document Revised: 09/21/2014 Document Reviewed: 01/02/2014 Elsevier Interactive Patient Education [employer]
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
## 2015-06-30 NOTE — ED Notes (Signed)
AAOx3.  Skin warm and dry.  NAD. Ambulates with easy and steady gait.  Moving all extremities equally and strong. 

## 2015-07-03 ENCOUNTER — Other Ambulatory Visit: Payer: Self-pay

## 2015-07-18 ENCOUNTER — Encounter: Payer: Self-pay | Admitting: Cardiology

## 2015-08-01 ENCOUNTER — Encounter: Payer: Self-pay | Admitting: Cardiology

## 2015-08-01 DIAGNOSIS — R0989 Other specified symptoms and signs involving the circulatory and respiratory systems: Secondary | ICD-10-CM

## 2015-08-02 ENCOUNTER — Encounter: Payer: Self-pay | Admitting: Cardiology

## 2016-01-30 ENCOUNTER — Encounter: Payer: Self-pay | Admitting: Urgent Care

## 2016-01-30 ENCOUNTER — Emergency Department: Payer: Self-pay

## 2016-01-30 ENCOUNTER — Emergency Department
Admission: EM | Admit: 2016-01-30 | Discharge: 2016-01-31 | Disposition: A | Discharge status: Home / Self Care | Payer: Self-pay | Attending: Emergency Medicine | Admitting: Emergency Medicine

## 2016-01-30 DIAGNOSIS — K859 Acute pancreatitis without necrosis or infection, unspecified: Secondary | ICD-10-CM | POA: Insufficient documentation

## 2016-01-30 DIAGNOSIS — E785 Hyperlipidemia, unspecified: Secondary | ICD-10-CM | POA: Insufficient documentation

## 2016-01-30 DIAGNOSIS — Z79899 Other long term (current) drug therapy: Secondary | ICD-10-CM | POA: Insufficient documentation

## 2016-01-30 DIAGNOSIS — I255 Ischemic cardiomyopathy: Secondary | ICD-10-CM | POA: Insufficient documentation

## 2016-01-30 DIAGNOSIS — R410 Disorientation, unspecified: Secondary | ICD-10-CM

## 2016-01-30 DIAGNOSIS — Z7982 Long term (current) use of aspirin: Secondary | ICD-10-CM | POA: Insufficient documentation

## 2016-01-30 DIAGNOSIS — I2510 Atherosclerotic heart disease of native coronary artery without angina pectoris: Secondary | ICD-10-CM | POA: Insufficient documentation

## 2016-01-30 DIAGNOSIS — I10 Essential (primary) hypertension: Secondary | ICD-10-CM | POA: Insufficient documentation

## 2016-01-30 DIAGNOSIS — R079 Chest pain, unspecified: Secondary | ICD-10-CM | POA: Insufficient documentation

## 2016-01-30 DIAGNOSIS — F17210 Nicotine dependence, cigarettes, uncomplicated: Secondary | ICD-10-CM | POA: Insufficient documentation

## 2016-01-30 DIAGNOSIS — I252 Old myocardial infarction: Secondary | ICD-10-CM | POA: Insufficient documentation

## 2016-01-30 DIAGNOSIS — E7220 Disorder of urea cycle metabolism, unspecified: Secondary | ICD-10-CM | POA: Insufficient documentation

## 2016-01-30 HISTORY — DX: Acute myocardial infarction, unspecified: I21.9

## 2016-01-30 LAB — CBC
HCT: 44.3 % (ref 40.0–52.0)
Hemoglobin: 15.2 g/dL (ref 13.0–18.0)
MCH: 30.2 pg (ref 26.0–34.0)
MCHC: 34.3 g/dL (ref 32.0–36.0)
MCV: 88 fL (ref 80.0–100.0)
Platelets: 213 10*3/uL (ref 150–440)
RBC: 5.04 MIL/uL (ref 4.40–5.90)
RDW: 13.5 % (ref 11.5–14.5)
WBC: 12 10*3/uL — ABNORMAL HIGH (ref 3.8–10.6)

## 2016-01-30 LAB — TROPONIN I: Troponin I: <0.03 ng/mL (ref <0.031)

## 2016-01-30 LAB — BASIC METABOLIC PANEL
Anion gap: 7 (ref 5–15)
BUN: 12 mg/dL (ref 6–20)
CO2: 27 mmol/L (ref 22–32)
CREATININE: 1.14 mg/dL (ref 0.61–1.24)
Calcium: 8.7 mg/dL — ABNORMAL LOW (ref 8.9–10.3)
Chloride: 106 mmol/L (ref 101–111)
GFR calc non Af Amer: >60 mL/min (ref >60)
GLUCOSE: 106 mg/dL — AB (ref 65–99)
Potassium: 4 mmol/L (ref 3.5–5.1)
Sodium: 140 mmol/L (ref 135–145)

## 2016-01-30 NOTE — ED Notes (Signed)
History of MI 2 years ago. 1 stent placed at that time. Patient states the pain feels similar.

## 2016-01-30 NOTE — ED Notes (Signed)
Patient presents with c/o LEFT sided CP with (+) SOB and nausea. Patient reports that he "feels like I am not here sometime".

## 2016-01-30 NOTE — ED Notes (Signed)
MD at bedside. 

## 2016-01-30 NOTE — ED Provider Notes (Signed)
[facility] Emergency Department Provider Note   ____________________________________________  Time seen: Approximately 11:15 PM  I have reviewed the triage vital signs and the nursing notes.   HISTORY  Chief Complaint Chest Pain and Altered Mental Status    HPI Kenneth Medina is a 45 y.o. male who presents to the ED from home with a chief complaint of chest pain and altered mental status. 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 Chest pain associated with shortness of breath only; patient denies associated diaphoresis, nausea, vomiting, palpitations, dizziness. Patient reports he "feels like I am not here are sometimes". Denies recent fever, chills, cough, congestion, abdominal pain, diarrhea. Denies recent travel or trauma. Nothing makes his pain better or worse.   Past Medical History  Diagnosis Date  . Tobacco abuse   . Coronary artery disease     a. s/p ant STEMI 5/15 >> LHC (5/15):  prox D1 30-40%, LAD occluded, prox PDA 20-30%, mid to dist Ant HK, EF 45% >>> PCI:  2.75x22 mm Resolute DES to mid LAD  . Ischemic cardiomyopathy     a. EF 45% at [facility] at time of MI >> b. Echo (5/15):  Apical HK, no clot, EF 50%, mod LVH, normal RVF  . Hyperlipidemia   . Hypertension   . MI (myocardial infarction) [facility])     2015    Patient Active Problem List   Diagnosis Date Noted  . Hyperlipidemia 02/12/2014  . Cardiomyopathy, ischemic 02/12/2014  . CAD (coronary artery disease) 01/24/2014  . Tobacco use 01/24/2014  . ST elevation (STEMI) myocardial infarction involving left anterior descending coronary artery (HCC) 01/23/2014    Past Surgical History  Procedure Laterality Date  . Left heart catheterization with  coronary angiogram N/A 01/23/2014    Procedure: LEFT HEART CATHETERIZATION WITH CORONARY ANGIOGRAM;  Surgeon: Micheline Chapman, MD;  Location: [facility] CATH LAB;  Service: Cardiovascular;  Laterality: N/A;    Current Outpatient Rx  Name  Route  Sig  Dispense  Refill  . aspirin 81 MG chewable tablet   Oral   Chew 1 tablet (81 mg total) by mouth daily.         Marland Kitchen atorvastatin (LIPITOR) 80 MG tablet   Oral   Take 1 tablet (80 mg total) by mouth daily at 6 PM.   30 tablet   5   . carvedilol (COREG) 6.25 MG tablet   Oral   Take 1 tablet (6.25 mg total) by mouth 2 (two) times daily with a meal.   60 tablet   5   . clopidogrel (PLAVIX) 75 MG tablet   Oral   Take 1 tablet (75 mg total) by mouth daily.   30 tablet   11   . cyclobenzaprine (FLEXERIL) 10 MG tablet   Oral   Take 1 tablet (10 mg total) by mouth every 8 (eight) hours as needed for muscle spasms.   20 tablet   0   . HYDROcodone-acetaminophen (NORCO/VICODIN) 5-325 MG tablet   Oral   Take 1 tablet by mouth every 6 (six) hours as needed for moderate pain or severe pain.   8 tablet   0   . lisinopril (PRINIVIL,ZESTRIL) 2.5 MG tablet   Oral   Take 1 tablet (  2.5 mg total) by mouth daily.   30 tablet   11   . nitroGLYCERIN (NITROSTAT) 0.4 MG SL tablet   Sublingual   Place 1 tablet (0.4 mg total) under the tongue every 5 (five) minutes x 3 doses as needed for chest pain.   25 tablet   2     Allergies Ibuprofen  Family History  Problem Relation Age of Onset  . Hypertension Mother   . Cancer Maternal Grandmother   . Cancer Maternal Grandfather   . Heart attack Neg Hx   . Stroke Neg Hx     Social History Social History  Substance Use Topics  . Smoking status: Current Every Day Smoker -- 0.50 packs/day for 30 years    Types: Cigarettes  . Smokeless tobacco: None  . Alcohol Use: No    Review of Systems  Constitutional: No fever/chills. Eyes: No visual changes. ENT: No sore throat. Cardiovascular:  Positive for chest pain. Respiratory: Denies shortness of breath. Gastrointestinal: No abdominal pain.  No nausea, no vomiting.  No diarrhea.  No constipation. Genitourinary: Negative for dysuria. Musculoskeletal: Negative for back pain. Skin: Negative for rash. Neurological: Positive for confusion. Negative for headaches, focal weakness or numbness.  10-point ROS otherwise negative.  ____________________________________________   PHYSICAL EXAM:  VITAL SIGNS: ED Triage Vitals  Enc Vitals Group     BP 01/30/16 2156 118/68 mmHg     Pulse Rate 01/30/16 2156 67     Resp 01/30/16 2156 18     Temp 01/30/16 2156 98.4 F (36.9 C)     Temp Source 01/30/16 2156 Oral     SpO2 01/30/16 2156 97 %     Weight 01/30/16 2156 195 lb (88.451 kg)     Height 01/30/16 2156 5\' 9"  (1.753 m)     Head Cir --      Peak Flow --      Pain Score 01/30/16 2157 7     Pain Loc --      Pain Edu? --      Excl. in GC? --     Constitutional: Asleep, awakened for exam. Alert and oriented. Well appearing and in no acute distress. Eyes: Conjunctivae are normal. PERRL. EOMI. Head: Atraumatic. Nose: No congestion/rhinnorhea. Mouth/Throat: Mucous membranes are moist.  Oropharynx non-erythematous. Neck: No stridor.  No carotid bruits. Cardiovascular: Normal rate, regular rhythm. Grossly normal heart sounds.  Good peripheral circulation. Respiratory: Normal respiratory effort.  No retractions. Lungs CTAB. Gastrointestinal: Soft and nontender. No distention. No abdominal bruits. No CVA tenderness. Musculoskeletal: No lower extremity tenderness nor edema.  No joint effusions. Neurologic:  Once awakened, normal speech and language. No gross focal neurologic deficits are appreciated. No gait instability. Skin:  Skin is warm, dry and intact. No rash noted. Psychiatric: Mood and affect are normal. Speech and behavior are normal.  ____________________________________________   LABS (all labs ordered are listed, but  only abnormal results are displayed)  Labs Reviewed  BASIC METABOLIC PANEL - Abnormal; Notable for the following:    Glucose, Bld 106 (*)    Calcium 8.7 (*)    All other components within normal limits  CBC - Abnormal; Notable for the following:    WBC 12.0 (*)    All other components within normal limits  AMMONIA - Abnormal; Notable for the following:    Ammonia 53 (*)    All other components within normal limits  ACETAMINOPHEN LEVEL - Abnormal; Notable for the following:    Acetaminophen (Tylenol), Serum <10 (*)  All other components within normal limits  HEPATIC FUNCTION PANEL - Abnormal; Notable for the following:    Bilirubin, Direct <0.1 (*)    All other components within normal limits  LIPASE, BLOOD - Abnormal; Notable for the following:    Lipase 68 (*)    All other components within normal limits  URINALYSIS COMPLETEWITH MICROSCOPIC ([facility] ONLY) - Abnormal; Notable for the following:    Color, Urine YELLOW (*)    APPearance CLEAR (*)    Protein, ur 30 (*)    All other components within normal limits  TROPONIN I  TROPONIN I  FIBRIN DERIVATIVES D-DIMER ([facility] ONLY)  ETHANOL  SALICYLATE LEVEL  URINE DRUG SCREEN, QUALITATIVE ([facility] ONLY)   ____________________________________________  EKG  ED ECG REPORT I, SUNG,JADE J, the attending physician, personally viewed and interpreted this ECG.   Date: 01/31/2016  EKG Time: 2153  Rate: 69  Rhythm: normal EKG, normal sinus rhythm  Axis: Normal  Intervals:none  ST&T Change: Nonspecific Unchanged from 06/2015  ____________________________________________  RADIOLOGY  CT head interpreted per Dr. Phill Myron: Negative head CT. No acute intracranial process identified.  Chest 2 view (view by me, interpreted per Dr. Tyron Russell): Minimal chronic bronchitic changes without infiltrate. ____________________________________________   PROCEDURES  Procedure(s) performed: None  Critical Care performed:  No  ____________________________________________   INITIAL IMPRESSION / ASSESSMENT AND PLAN / ED COURSE  Pertinent labs & imaging results that were available during my care of the patient were reviewed by me and considered in my medical decision making (see chart for details).  45 year old male with a history of CAD who presents with an almost 2 day history of constant, left sharp chest pain. EKG and initial troponin unremarkable. Will add d-dimer, toxicological screen for complaints of altered mental status, CT head and reassess.  ----------------------------------------- 2:15 AM on 01/31/2016 -----------------------------------------  Patient is more alert, resting comfortably no acute distress. Updated patient of repeat negative troponin and mildly elevated ammonia. Lactulose given in the ED prior to patient's discharge. Plan for outpatient GI follow-up for further evaluation of hyperammonemia and follow-up with patient's cardiologist for chest pain. Strict return precautions given. Patient verbalizes understanding and agrees with plan of care. ____________________________________________   FINAL CLINICAL IMPRESSION(S) / ED DIAGNOSES  Final diagnoses:  Chest pain, unspecified chest pain type  Confusion  Hyperammonemia (HCC)  Acute pancreatitis, unspecified pancreatitis type      NEW MEDICATIONS STARTED DURING THIS VISIT:  Discharge Medication List as of 01/31/2016  2:16 AM       Note:  This document was prepared using Dragon voice recognition software and may include unintentional dictation errors.    Irean Hong, MD 01/31/16 [phone]

## 2016-01-31 LAB — URINALYSIS COMPLETE WITH MICROSCOPIC (ARMC ONLY)
BILIRUBIN URINE: NEGATIVE
Bacteria, UA: NONE SEEN
Glucose, UA: NEGATIVE mg/dL
Hgb urine dipstick: NEGATIVE
KETONES UR: NEGATIVE mg/dL
Leukocytes, UA: NEGATIVE
Nitrite: NEGATIVE
PROTEIN: 30 mg/dL — AB
Specific Gravity, Urine: 1.026 (ref 1.005–1.030)
Squamous Epithelial / LPF: NONE SEEN
pH: 7 (ref 5.0–8.0)

## 2016-01-31 LAB — AMMONIA: AMMONIA: 53 umol/L — AB (ref 9–35)

## 2016-01-31 LAB — URINE DRUG SCREEN, QUALITATIVE (ARMC ONLY)
AMPHETAMINES, UR SCREEN: NOT DETECTED
Barbiturates, Ur Screen: NOT DETECTED
Benzodiazepine, Ur Scrn: NOT DETECTED
Cannabinoid 50 Ng, Ur ~~LOC~~: NOT DETECTED
Cocaine Metabolite,Ur ~~LOC~~: NOT DETECTED
MDMA (ECSTASY) UR SCREEN: NOT DETECTED
Methadone Scn, Ur: NOT DETECTED
Opiate, Ur Screen: NOT DETECTED
PHENCYCLIDINE (PCP) UR S: NOT DETECTED
Tricyclic, Ur Screen: NOT DETECTED

## 2016-01-31 LAB — HEPATIC FUNCTION PANEL
ALT: 21 U/L (ref 17–63)
AST: 24 U/L (ref 15–41)
Albumin: 3.5 g/dL (ref 3.5–5.0)
Alkaline Phosphatase: 102 U/L (ref 38–126)
BILIRUBIN TOTAL: 0.3 mg/dL (ref 0.3–1.2)
Total Protein: 6.7 g/dL (ref 6.5–8.1)

## 2016-01-31 LAB — SALICYLATE LEVEL: Salicylate Lvl: <4 mg/dL (ref 2.8–30.0)

## 2016-01-31 LAB — LIPASE, BLOOD: LIPASE: 68 U/L — AB (ref 11–51)

## 2016-01-31 LAB — FIBRIN DERIVATIVES D-DIMER (ARMC ONLY): FIBRIN DERIVATIVES D-DIMER (ARMC): 229 (ref 0–499)

## 2016-01-31 LAB — ETHANOL: Alcohol, Ethyl (B): <5 mg/dL (ref <5)

## 2016-01-31 LAB — ACETAMINOPHEN LEVEL

## 2016-01-31 LAB — TROPONIN I

## 2016-01-31 MED ORDER — LACTULOSE 10 GM/15ML PO SOLN
30.0000 g | Freq: Once | ORAL | Status: AC
  Administered 2016-01-31: 30 g via ORAL
  Filled 2016-01-31: qty 60

## 2016-01-31 NOTE — Discharge Instructions (Signed)
Return to the ER for worsening symptoms, persistent vomiting, difficulty breathing, lethargy or other concerns.  Nonspecific Chest Pain It is often hard to find the cause of chest pain. There is always a chance that your pain could be related to something serious, such as a heart attack or a blood clot in your lungs. Chest pain can also be caused by conditions that are not life-threatening. If you have chest pain, it is very important to follow up with your doctor.  HOME CARE  If you were prescribed an antibiotic medicine, finish it all even if you start to feel better.  Avoid any activities that cause chest pain.  Do not use any tobacco products, including cigarettes, chewing tobacco, or electronic cigarettes. If you need help quitting, ask your doctor.  Do not drink alcohol.  Take medicines only as told by your doctor.  Keep all follow-up visits as told by your doctor. This is important. This includes any further testing if your chest pain does not go away.  Your doctor may tell you to keep your head raised (elevated) while you sleep.  Make lifestyle changes as told by your doctor. These may include:  Getting regular exercise. Ask your doctor to suggest some activities that are safe for you.  Eating a heart-healthy diet. Your doctor or a diet specialist (dietitian) can help you to learn healthy eating options.  Maintaining a healthy weight.  Managing diabetes, if necessary.  Reducing stress. GET HELP IF:  Your chest pain does not go away, even after treatment.  You have a rash with blisters on your chest.  You have a fever. GET HELP RIGHT AWAY IF:  Your chest pain is worse.  You have an increasing cough, or you cough up blood.  You have severe belly (abdominal) pain.  You feel extremely weak.  You pass out (faint).  You have chills.  You have sudden, unexplained chest discomfort.  You have sudden, unexplained discomfort in your arms, back, neck, or jaw.  You  have shortness of breath at any time.  You suddenly start to sweat, or your skin gets clammy.  You feel nauseous.  You vomit.  You suddenly feel light-headed or dizzy.  Your heart begins to beat quickly, or it feels like it is skipping beats. These symptoms may be an emergency. Do not wait to see if the symptoms will go away. Get medical help right away. Call your local emergency services (911 in the [place] Do not drive yourself to the hospital.   This information is not intended to replace advice given to you by your health care provider. Make sure you discuss any questions you have with your health care provider.   Document Released: 02/17/2008 Document Revised: 09/21/2014 Document Reviewed: 04/06/2014 Elsevier Interactive Patient Education 2016 [employer].  Confusion Confusion is the inability to think with your usual speed or clarity. Confusion may come on quickly or slowly over time. How quickly the confusion comes on depends on the cause. Confusion can be due to any number of causes. CAUSES   Concussion, head injury, or head trauma.  Seizures.  Stroke.  Fever.  Brain tumor.  Age related decreased brain function (dementia).  Heightened emotional states like rage or terror.  Mental illness in which the person loses the ability to determine what is real and what is not (hallucinations).  Infections such as a urinary tract infection (UTI).  Toxic effects from alcohol, drugs, or prescription medicines.  Dehydration and an imbalance of salts in  the body (electrolytes).  Lack of sleep.  Low blood sugar (diabetes).  Low levels of oxygen from conditions such as chronic lung disorders.  Drug interactions or other medicine side effects.  Nutritional deficiencies, especially niacin, thiamine, vitamin C, or vitamin B.  Sudden drop in body temperature (hypothermia).  Change in routine, such as when traveling or hospitalized. SIGNS AND SYMPTOMS  People often  describe their thinking as cloudy or unclear when they are confused. Confusion can also include feeling disoriented. That means you are unaware of where or who you are. You may also not know what the date or time is. If confused, you may also have difficulty paying attention, remembering, and making decisions. Some people also act aggressively when they are confused.  DIAGNOSIS  The medical evaluation of confusion may include:  Blood and urine tests.  X-rays.  Brain and nervous system tests.  Analyzing your brain waves (electroencephalogram or EEG).  Magnetic resonance imaging (MRI) of your head.  Computed tomography (CT) scan of your head.  Mental status tests in which your health care provider may ask many questions. Some of these questions may seem silly or strange, but they are a very important test to help diagnose and treat confusion. TREATMENT  An admission to the hospital may not be needed, but a person with confusion should not be left alone. Stay with a family member or friend until the confusion clears. Avoid alcohol, pain relievers, or sedative drugs until you have fully recovered. Do not drive until directed by your health care provider. HOME CARE INSTRUCTIONS  What family and friends can do:  To find out if someone is confused, ask the person to state his or her name, age, and the date. If the person is unsure or answers incorrectly, he or she is confused.  Always introduce yourself, no matter how well the person knows you.  Often remind the person of his or her location.  Place a calendar and clock near the confused person.  Help the person with his or her medicines. You may want to use a pill box, an alarm as a reminder, or give the person each dose as prescribed.  Talk about current events and plans for the day.  Try to keep the environment calm, quiet, and peaceful.  Make sure the person keeps follow-up visits with his or her health care provider. PREVENTION    Ways to prevent confusion:  Avoid alcohol.  Eat a balanced diet.  Get enough sleep.  Take medicine only as directed by your health care provider.  Do not become isolated. Spend time with other people and make plans for your days.  Keep careful watch on your blood sugar levels if you are diabetic. SEEK IMMEDIATE MEDICAL CARE IF:   You develop severe headaches, repeated vomiting, seizures, blackouts, or slurred speech.  There is increasing confusion, weakness, numbness, restlessness, or personality changes.  You develop a loss of balance, have marked dizziness, feel uncoordinated, or fall.  You have delusions, hallucinations, or develop severe anxiety.  Your family members think you need to be rechecked.   This information is not intended to replace advice given to you by your health care provider. Make sure you discuss any questions you have with your health care provider.   Document Released: 10/08/2004 Document Revised: 09/21/2014 Document Reviewed: 10/06/2013 Elsevier Interactive Patient Education 2016 [employer]  Acute Pancreatitis Acute pancreatitis is a disease in which the pancreas becomes suddenly irritated (inflamed). The pancreas is a large gland behind  your stomach. The pancreas makes enzymes that help digest food. The pancreas also makes 2 hormones that help control your blood sugar. Acute pancreatitis happens when the enzymes attack and damage the pancreas. Most attacks last a couple of days and can cause serious problems. HOME CARE  Follow your doctor's diet instructions. You may need to avoid alcohol and limit fat in your diet.  Eat small meals often.  Drink enough fluids to keep your pee (urine) clear or pale yellow.  Only take medicines as told by your doctor.  Avoid drinking alcohol if it caused your disease.  Do not smoke.  Get plenty of rest.  Check your blood sugar at home as told by your doctor.  Keep all doctor visits as told. GET HELP  IF:  You do not get better as quickly as expected.  You have new or worsening symptoms.  You have lasting pain, weakness, or feel sick to your stomach (nauseous).  You get better and then have another pain attack. GET HELP RIGHT AWAY IF:   You are unable to eat or keep fluids down.  Your pain becomes severe.  You have a fever or lasting symptoms for more than 2 to 3 days.  You have a fever and your symptoms suddenly get worse.  Your skin or the white part of your eyes turn yellow (jaundice).  You throw up (vomit).  You feel dizzy, or you pass out (faint).  Your blood sugar is high (over 300 mg/dL). MAKE SURE YOU:   Understand these instructions.  Will watch your condition.  Will get help right away if you are not doing well or get worse.   This information is not intended to replace advice given to you by your health care provider. Make sure you discuss any questions you have with your health care provider.   Document Released: 02/17/2008 Document Revised: 09/21/2014 Document Reviewed: 12/10/2011 Elsevier Interactive Patient Education 2016 [employer] Inc[employer]  Ammonia Test WHY AM I HAVING THIS TEST? Ammonia testing is used to help diagnose and monitor severe liver diseases. It is also used to diagnose and monitor a brain disorder that can develop in individuals who have liver disease (hepatic encephalopathy).  Ammonia levels can rise when the liver and kidneys are not working well enough to get rid of urea. A buildup of ammonia in the body can cause mental and neurological changes that can lead to confusion, disorientation, sleepiness, and eventually coma and even death. Infants and children with increased ammonia levels may vomit often, be irritable, and be increasingly lethargic. Without treatment they may experience seizures and breathing difficulty and go into a coma and die. WHAT KIND OF SAMPLE IS TAKEN? A blood sample is needed for this test. It is usually collected by  inserting a needle into a vein. HOW DO I PREPARE FOR THE TEST? Follow instructions from your health care provider or your child's health care provider about avoiding these before the test:  Exercise.  Smoking cigarettes.  Certain medicines. WHAT ARE THE REFERENCE RANGES? Reference ranges are considered healthy ranges established after testing a large group of healthy people. Reference ranges may vary among different people, labs, and hospitals. It is your responsibility to obtain the test results. Ask the lab or department performing the test when and how you will get the results.  WHAT DO THE RESULTS MEAN? Increased levels of ammonia may mean that you have or your child has:  Liver disease.  Gastrointestinal (GI) bleeding or GI obstruction.  Severe  heart failure.  Hemolytic disease of newborn.  Hepatic encephalopathy.  A genetic metabolic disorder. Decreased levels of ammonia may mean that you have or your child has:  High blood pressure (hypertension).  A genetic metabolic syndrome. Talk with your health care provider to discuss the results, treatment options, and if necessary, the need for more tests. Talk with your health care provider if you have any questions about your results.   This information is not intended to replace advice given to you by your health care provider. Make sure you discuss any questions you have with your health care provider.   Document Released: 09/22/2004 Document Revised: 09/21/2014 Document Reviewed: 01/30/2014 Elsevier Interactive Patient Education [employer]

## 2016-02-24 ENCOUNTER — Encounter: Payer: Self-pay | Admitting: Cardiovascular Disease

## 2016-02-24 ENCOUNTER — Ambulatory Visit (INDEPENDENT_AMBULATORY_CARE_PROVIDER_SITE_OTHER): Payer: Self-pay | Admitting: Cardiovascular Disease

## 2016-02-24 VITALS — BP 138/80 | HR 70 | Ht 69.0 in | Wt 187.8 lb

## 2016-02-24 DIAGNOSIS — I25118 Atherosclerotic heart disease of native coronary artery with other forms of angina pectoris: Secondary | ICD-10-CM

## 2016-02-24 DIAGNOSIS — R400 Somnolence: Secondary | ICD-10-CM

## 2016-02-24 DIAGNOSIS — E785 Hyperlipidemia, unspecified: Secondary | ICD-10-CM

## 2016-02-24 DIAGNOSIS — R0602 Shortness of breath: Secondary | ICD-10-CM

## 2016-02-24 DIAGNOSIS — I255 Ischemic cardiomyopathy: Secondary | ICD-10-CM

## 2016-02-24 MED ORDER — CARVEDILOL 6.25 MG PO TABS: 6.2500 mg | ORAL_TABLET | Freq: Two times a day (BID) | ORAL | Status: DC

## 2016-02-24 MED ORDER — ATORVASTATIN CALCIUM 80 MG PO TABS: 80.0000 mg | ORAL_TABLET | Freq: Every day | ORAL | Status: DC

## 2016-02-24 NOTE — Patient Instructions (Signed)
Medication Instructions:  Your physician recommends that you continue on your current medications as directed. Please refer to the Current Medication list given to you today.  Labwork: No new orders.   Testing/Procedures: Your physician has requested that you have an echocardiogram. Echocardiography is a painless test that uses sound waves to create images of your heart. It provides your doctor with information about the size and shape of your heart and how well your heart's chambers and valves are working. This procedure takes approximately one hour. There are no restrictions for this procedure.  Your physician has requested that you have an exercise stress myoview. For further information please visit [email or web address] Please follow instruction sheet, as given.  Your physician has recommended that you have a sleep study. This test records several body functions during sleep, including: brain activity, eye movement, oxygen and carbon dioxide blood levels, heart rate and rhythm, breathing rate and rhythm, the flow of air through your mouth and nose, snoring, body muscle movements, and chest and belly movement.  Follow-Up: Your physician recommends that you schedule a follow-up appointment in: 3 MONTHS with Tereso Newcomer PA-C  Your physician wants you to follow-up in: 1 YEAR with Dr Excell Seltzer.  You will receive a reminder letter in the mail two months in advance. If you don't receive a letter, please call our office to schedule the follow-up appointment.   Any Other Special Instructions Will Be Listed Below (If Applicable).     If you need a refill on your cardiac medications before your next appointment, please call your pharmacy.

## 2016-02-24 NOTE — Progress Notes (Signed)
Cardiology Office Note Date:  02/24/2016   ID:  Kenneth Medina, DOB 03-25-1971, MRN [id number]  PCP:  None on file Cardiologist:  Tonny Bollman, MD    Chief Complaint  Patient presents with  . Coronary Artery Disease  . Hyperlipidemia  . Chest Pain  . Shortness of Breath  . Fatigue    falls asleep while sitting up watching tv     History of Present Illness: Kenneth Medina is a 45 y.o. male who presents for Follow-up evaluation. He has been followed since May 2015 when he presented with an acute anterior STEMI. LV function was reasonably well preserved with an LVEF of approximately 50% by echo immediately after his heart attack. He was treated with primary PCI to the LAD with a drug-eluting stent. He had no other significant coronary disease.  He continues to work as a Designer, industrial/product. He's physically active with no exertional chest pain or pressure. He did have a prolonged episode of sharp chest pain last month. This prompted an ER evaluation which was negative. He has a multitude of symptoms today including left-sided sharp chest pain, waking up at night short of breath, snoring, excessive fatigue, and daytime somnolence. He denies edema, orthopnea, or PND.  Past Medical History  Diagnosis Date  . Tobacco abuse   . Coronary artery disease     a. s/p ant STEMI 5/15 >> LHC (5/15):  prox D1 30-40%, LAD occluded, prox PDA 20-30%, mid to dist Ant HK, EF 45% >>> PCI:  2.75x22 mm Resolute DES to mid LAD  . Ischemic cardiomyopathy     a. EF 45% at [facility] at time of MI >> b. Echo (5/15):  Apical HK, no clot, EF 50%, mod LVH, normal RVF  . Hyperlipidemia   . Hypertension   . MI (myocardial infarction) (HCC)     2015    Past Surgical History  Procedure Laterality Date  . Left heart catheterization with coronary angiogram N/A 01/23/2014    Procedure: LEFT HEART CATHETERIZATION WITH CORONARY ANGIOGRAM;  Surgeon: Micheline Chapman, MD;  Location: [facility] CATH LAB;  Service: Cardiovascular;   Laterality: N/A;    Current Outpatient Prescriptions  Medication Sig Dispense Refill  . aspirin 81 MG chewable tablet Chew 1 tablet (81 mg total) by mouth daily.    Marland Kitchen atorvastatin (LIPITOR) 80 MG tablet Take 1 tablet (80 mg total) by mouth daily at 6 PM. 30 tablet 5  . carvedilol (COREG) 6.25 MG tablet Take 1 tablet (6.25 mg total) by mouth 2 (two) times daily with a meal. 60 tablet 5  . clopidogrel (PLAVIX) 75 MG tablet Take 1 tablet (75 mg total) by mouth daily. 30 tablet 11  . cyclobenzaprine (FLEXERIL) 10 MG tablet Take 1 tablet (10 mg total) by mouth every 8 (eight) hours as needed for muscle spasms. 20 tablet 0  . HYDROcodone-acetaminophen (NORCO/VICODIN) 5-325 MG tablet Take 1 tablet by mouth every 6 (six) hours as needed for moderate pain or severe pain. 8 tablet 0  . lisinopril (PRINIVIL,ZESTRIL) 2.5 MG tablet Take 1 tablet (2.5 mg total) by mouth daily. 30 tablet 11  . nitroGLYCERIN (NITROSTAT) 0.4 MG SL tablet Place 1 tablet (0.4 mg total) under the tongue every 5 (five) minutes x 3 doses as needed for chest pain. 25 tablet 2   No current facility-administered medications for this visit.    Allergies:   Ibuprofen   Social History:  The patient  reports that he has been smoking Cigarettes.  He has a  15 pack-year smoking history. He does not have any smokeless tobacco history on file. He reports that he does not drink alcohol or use illicit drugs.   Family History:  The patient's  family history includes Cancer in his maternal grandfather and maternal grandmother; Hypertension in his mother. There is no history of Heart attack or Stroke.    ROS:  Please see the history of present illness.  Otherwise, review of systems is positive for Visual changes, dizziness, irregular heartbeats, snoring, anxiety.  All other systems are reviewed and negative.    PHYSICAL EXAM: VS:  BP 138/80 mmHg  Pulse 70  Ht 5\' 9"  (1.753 m)  Wt 187 lb 12.8 oz (85.186 kg)  BMI 27.72 kg/m2 , BMI Body mass  index is 27.72 kg/(m^2). GEN: Well nourished, well developed, in no acute distress HEENT: normal Neck: no JVD, no masses. No carotid bruits Cardiac: RRR without murmur or gallop                Respiratory:  clear to auscultation bilaterally, normal work of breathing GI: soft, nontender, nondistended, + BS MS: no deformity or atrophy Ext: no pretibial edema, pedal pulses 2+= bilaterally Skin: warm and dry, no rash Neuro:  Strength and sensation are intact Psych: euthymic mood, full affect  EKG:  EKG is ordered today. The ekg ordered today shows normal sinus rhythm 71 bpm, within normal limits.  Recent Labs: 01/30/2016: ALT 21; BUN 12; Creatinine, Ser 1.14; Hemoglobin 15.2; Platelets 213; Potassium 4.0; Sodium 140   Lipid Panel     Component Value Date/Time   CHOL 105 03/19/2014 1116   TRIG 98.0 03/19/2014 1116   HDL 29.40* 03/19/2014 1116   CHOLHDL 4 03/19/2014 1116   VLDL 19.6 03/19/2014 1116   LDLCALC 56 03/19/2014 1116      Wt Readings from Last 3 Encounters:  02/24/16 187 lb 12.8 oz (85.186 kg)  01/30/16 195 lb (88.451 kg)  06/30/15 191 lb (86.637 kg)    Cardiac Studies Reviewed: Cath 01-23-2014: Procedure: Left Heart Cath, Selective Coronary Angiography, LV angiography, PTCA and stenting of the mid-LAD  Indication: Anterior STEMI  Procedural Details: The right wrist was prepped, draped, and anesthetized with 1% lidocaine. Using the modified Seldinger technique, a 5/6 French sheath was introduced into the right radial artery. 3 mg of verapamil was administered through the sheath, weight-based unfractionated heparin was administered intravenously. Standard Judkins catheters were used for selective coronary angiography and left ventriculography. Ventriculography was performed after PCI. Catheter exchanges were performed over an exchange length guidewire.  PROCEDURAL FINDINGS Hemodynamics: AO 100/67 LV 107/24  Coronary angiography: Coronary dominance:  right  Left mainstem: The left mainstem is patent. There is no significant obstructive disease. The left main divides into the LAD and left circumflex.  Left anterior descending (LAD): The proximal LAD has minor irregularities. The first diagonal bifurcates at its origin and essentially supplies a ramus intermedius territory. There is a 30-40% proximal stenosis. Just after the first septal perforator, the LAD is totally occluded with TIMI 0 flow.  Left circumflex (LCx): The left circumflex is small to medium in caliber and covers a small distribution. There are 2 small obtuse marginal branches without significant disease.  Right coronary artery (RCA): Large, dominant vessel. There are diffuse luminal irregularities without significant stenosis. The PDA branch is large with mild 20-30% proximal stenosis. The first and second posterolateral branches are moderate in caliber without significant stenosis. There is collateral filling of the apical LAD.  Left ventriculography: Left ventricular  systolic function is mildly depressed. There is severe hypokinesis of the mid and distal anterior walls as well as the apex. The basal anterior and basal and mid-inferior walls are hyperdynamic. The estimated LVEF is 45%.  PCI Note: Following the diagnostic procedure, the decision was made to proceed with PCI. The patient had received aspirin 324 mg and heparin 4000 units in the emergency department. He was loaded with effient 60 mg on the table. Weight-based bivalirudin was given for anticoagulation. Once a therapeutic ACT was achieved, a 6 [place] XB LAD guide catheter was inserted. A cougar coronary guidewire was used to cross the lesion. The lesion was predilated with a 2.0 x 15 mm balloon. The lesion was then stented with a 2.75 x 22 mm resolute drug-eluting stent. The stent was postdilated with a 3.25 mm noncompliant balloon to 14 atmospheres. Following PCI, there was 0% residual stenosis and TIMI-3 flow. Final  angiography confirmed an excellent result. The patient tolerated the procedure well. He was chest pain-free at the completion of the procedure There were no immediate procedural complications. A TR band was used for radial hemostasis. The patient was transferred to the post catheterization recovery area for further monitoring.  PCI Data: Vessel - LAD/Segment - mid Percent Stenosis (pre) 100  TIMI-flow 0 Stent 2.75x22 mm Resolute DES (post-dil to > 3.25) Percent Stenosis (post) 0 TIMI-flow (post) 3  Final Conclusions:  1. Total occlusion of the LAD after the first septal perforator, treated successfully with primary PCI using a DES platform 2. Minor nonobstructive stenosis of the LM, LCx, and RCA 3. Mild segmental LV systolic dysfunction with moderately elevated LVEDP   Recommendations:  Initiate post-MI med Rx with carvedilol, ASA, Effient, high-dose atorvastatin. Start low-dose ACE in am if BP will allow. Tobacco cessation counseling. Anticipate tx out of ICU tomorrow if stable and discharge on hospital day #2.  Echo 01/24/2014: Left ventricle: Moderate hypokinesis of apical cap and the inferior/septal/lateral/anterior segments of the apex. No dilitation of the apex. No clot. EF remains 50%. All other LV segments look good. The cavity size was normal. Wall thickness was increased in a pattern of moderate LVH.  ------------------------------------------------------------ Aortic valve:  Structurally normal valve.  Cusp separation was normal. Doppler: Transvalvular velocity was within the normal range. There was no stenosis. No regurgitation.  ------------------------------------------------------------ Aorta: Aortic root: The aortic root was normal in size.  ------------------------------------------------------------ Mitral valve:  Structurally normal valve.  Leaflet separation was normal. Doppler: Transvalvular velocity was within the normal range. There was no  evidence for stenosis. No regurgitation.  ------------------------------------------------------------ Left atrium: The atrium was at the upper limits of normal in size.  ------------------------------------------------------------ Right ventricle: The cavity size was normal. Systolic function was normal.  ------------------------------------------------------------ Pulmonic valve:  The valve appears to be grossly normal. Doppler:  No significant regurgitation.  ------------------------------------------------------------ Tricuspid valve:  Structurally normal valve.  Leaflet separation was normal. Doppler: Transvalvular velocity was within the normal range. No regurgitation.  ------------------------------------------------------------ Right atrium: The atrium was at the upper limits of normal in size.  ------------------------------------------------------------ Pericardium: There was no pericardial effusion.  ASSESSMENT AND PLAN: 1.  CAD, native vessel: old MI. Now with chest pain and a constellation of other symptoms. I've recommended an exercise Myoview stress test to rule out significant ischemia. He will continue on his current medical program.  2. Essential hypertension: Continue carvedilol and lisinopril. Blood pressure appears to be well controlled.  3. Hyperlipidemia: Treated with high-dose atorvastatin 80 mg.  4. Shortness of breath: Unclear etiology.  Recommend check 2-D echo to evaluate LV systolic and diastolic dysfunction.  5. Excessive daytime somnolence: He reports waking up at night short of breath, fatigue, and reported snoring and apneic episodes at night by his wife. I have recommended a formal sleep study.  Current medicines are reviewed with the patient today.  The patient does not have concerns regarding medicines.  Labs/ tests ordered today include:   Orders Placed This Encounter  Procedures  . Myocardial Perfusion Imaging  . EKG  12-Lead  . ECHOCARDIOGRAM COMPLETE  . Split night study   Disposition:   FU 3 months with APP  Signed, Tonny Bollman, MD  02/24/2016 3:57 PM    [facility] [address] [place], [place], [place]  [address] Phone: [phone]; Fax: [phone]

## 2016-03-10 ENCOUNTER — Telehealth (HOSPITAL_COMMUNITY): Payer: Self-pay | Admitting: *Deleted

## 2016-03-10 NOTE — Telephone Encounter (Signed)
Patient given detailed instructions per Myocardial Perfusion Study Information Sheet for the test on 03/12/16. Patient notified to arrive 15 minutes early and that it is imperative to arrive on time for appointment to keep from having the test rescheduled.  If you need to cancel or reschedule your appointment, please call the office within 24 hours of your appointment. Failure to do so may result in a cancellation of your appointment, and a $50 no show fee. Patient verbalized understanding. Kayonna Lawniczak J Gerasimos Plotts, RN   

## 2016-03-12 ENCOUNTER — Ambulatory Visit (HOSPITAL_COMMUNITY): Payer: Self-pay | Attending: Cardiology

## 2016-03-12 ENCOUNTER — Other Ambulatory Visit: Payer: Self-pay

## 2016-03-12 ENCOUNTER — Ambulatory Visit (HOSPITAL_BASED_OUTPATIENT_CLINIC_OR_DEPARTMENT_OTHER): Payer: Self-pay

## 2016-03-12 DIAGNOSIS — R400 Somnolence: Secondary | ICD-10-CM

## 2016-03-12 DIAGNOSIS — I255 Ischemic cardiomyopathy: Secondary | ICD-10-CM

## 2016-03-12 DIAGNOSIS — R079 Chest pain, unspecified: Secondary | ICD-10-CM | POA: Insufficient documentation

## 2016-03-12 DIAGNOSIS — I25118 Atherosclerotic heart disease of native coronary artery with other forms of angina pectoris: Secondary | ICD-10-CM

## 2016-03-12 DIAGNOSIS — I10 Essential (primary) hypertension: Secondary | ICD-10-CM | POA: Insufficient documentation

## 2016-03-12 DIAGNOSIS — R5383 Other fatigue: Secondary | ICD-10-CM | POA: Insufficient documentation

## 2016-03-12 DIAGNOSIS — Z8249 Family history of ischemic heart disease and other diseases of the circulatory system: Secondary | ICD-10-CM | POA: Insufficient documentation

## 2016-03-12 DIAGNOSIS — E785 Hyperlipidemia, unspecified: Secondary | ICD-10-CM | POA: Insufficient documentation

## 2016-03-12 DIAGNOSIS — R0602 Shortness of breath: Secondary | ICD-10-CM | POA: Insufficient documentation

## 2016-03-12 LAB — MYOCARDIAL PERFUSION IMAGING
CHL CUP RESTING HR STRESS: 59 {beats}/min
CHL RATE OF PERCEIVED EXERTION: 19
CSEPED: 7 min
CSEPPHR: 131 {beats}/min
Estimated workload: 8.4 METS
Exercise duration (sec): 17 s
LV dias vol: 115 mL (ref 62–150)
LVSYSVOL: 54 mL
MPHR: 174 {beats}/min
NUC STRESS TID: 0.9
Percent HR: 75 %
RATE: 0.29
SDS: 0
SRS: 2
SSS: 2

## 2016-03-12 LAB — ECHOCARDIOGRAM COMPLETE
Ao-asc: 33 cm
CHL CUP MV DEC (S): 437
E decel time: 437 msec
E/e' ratio: 6.09
FS: 34 % (ref 28–44)
IVS/LV PW RATIO, ED: 0.76
LA diam index: 1.79 cm/m2
LA vol index: 19.5 mL/m2
LA vol: 39.2 mL
LASIZE: 36 mm
LAVOLA4C: 35.2 mL
LDCA: 4.15 cm2
LEFT ATRIUM END SYS DIAM: 36 mm
LV E/e'average: 6.09
LV PW d: 10.1 mm — AB (ref 0.6–1.1)
LV TDI E'LATERAL: 11.6
LV TDI E'MEDIAL: 8.81
LVEEMED: 6.09
LVELAT: 11.6 cm/s
LVOT SV: 96 mL
LVOT VTI: 23.2 cm
LVOTD: 23 mm
LVOTPV: 108 cm/s
MV pk A vel: 41.6 m/s
MVPKEVEL: 70.6 m/s
RV TAPSE: 23.2 mm

## 2016-03-12 MED ORDER — TECHNETIUM TC 99M TETROFOSMIN IV KIT
10.9000 | PACK | Freq: Once | INTRAVENOUS | Status: AC | PRN
  Administered 2016-03-12: 10.9 via INTRAVENOUS
  Filled 2016-03-12: qty 11

## 2016-03-12 MED ORDER — REGADENOSON 0.4 MG/5ML IV SOLN
0.4000 mg | Freq: Once | INTRAVENOUS | Status: AC
  Administered 2016-03-12: 0.4 mg via INTRAVENOUS

## 2016-03-12 MED ORDER — TECHNETIUM TC 99M TETROFOSMIN IV KIT
32.4000 | PACK | Freq: Once | INTRAVENOUS | Status: AC | PRN
  Administered 2016-03-12: 32 via INTRAVENOUS
  Filled 2016-03-12: qty 32

## 2016-04-08 ENCOUNTER — Encounter: Payer: Self-pay | Admitting: Physician Assistant

## 2016-04-13 ENCOUNTER — Encounter: Payer: Self-pay | Admitting: Physician Assistant

## 2016-04-26 ENCOUNTER — Ambulatory Visit (HOSPITAL_BASED_OUTPATIENT_CLINIC_OR_DEPARTMENT_OTHER): Payer: Self-pay | Attending: Cardiovascular Disease | Admitting: Cardiology

## 2016-04-26 VITALS — Ht 70.0 in | Wt 195.0 lb

## 2016-04-26 DIAGNOSIS — G4719 Other hypersomnia: Secondary | ICD-10-CM

## 2016-04-26 DIAGNOSIS — R0602 Shortness of breath: Secondary | ICD-10-CM

## 2016-04-26 DIAGNOSIS — R0683 Snoring: Secondary | ICD-10-CM | POA: Insufficient documentation

## 2016-04-26 DIAGNOSIS — I493 Ventricular premature depolarization: Secondary | ICD-10-CM | POA: Insufficient documentation

## 2016-04-26 DIAGNOSIS — G4733 Obstructive sleep apnea (adult) (pediatric): Secondary | ICD-10-CM

## 2016-04-27 NOTE — Progress Notes (Signed)
Spoke with patient and advised of sleep study results and Dr.Turner's recommendations. Patient verbalized understanding and agrees with treatment plan. Scheduled f/u appt for 06/18/16 @ 1:30 to discuss treatment options.

## 2016-04-27 NOTE — Procedures (Signed)
   Patient Name: Kenneth Medina, Kenneth Medina Date: 04/26/2016 Gender: Male D.O.B: Oct 14, 1970 Age (years): 45 Referring Provider: Tonny Bollman Height (inches): 70 Interpreting Physician: Armanda Magic MD, ABSM Weight (lbs): 195 RPSGT: Wylie Hail BMI: 28 MRN: [id number] Neck Size: 15.75  CLINICAL INFORMATION Sleep Study Type: NPSG Indication for sleep study: Excessive Daytime Sleepiness Epworth Sleepiness Score: 11  SLEEP STUDY TECHNIQUE As per the AASM Manual for the Scoring of Sleep and Associated Events v2.3 (April 2016) with a hypopnea requiring 4% desaturations. The channels recorded and monitored were frontal, central and occipital EEG, electrooculogram (EOG), submentalis EMG (chin), nasal and oral airflow, thoracic and abdominal wall motion, anterior tibialis EMG, snore microphone, electrocardiogram, and pulse oximetry.  MEDICATIONS Patient's medications include: Reviewed in the chart. Medications self-administered by patient during sleep study : No sleep medicine administered.  SLEEP ARCHITECTURE The study was initiated at 10:40:25 PM and ended at 4:47:21 AM. Sleep onset time was 35.2 minutes and the sleep efficiency was 86.6%. The total sleep time was 317.7 minutes. Stage REM latency was short at 47.5 minutes. The patient spent 10.86% of the night in stage N1 sleep, 71.36% in stage N2 sleep, 0.16% in stage N3 and 17.63% in REM. Alpha intrusion was absent. Supine sleep was 16.94%.  RESPIRATORY PARAMETERS The overall apnea/hypopnea index (AHI) was 6.8 per hour. There were 30 total apneas, including 30 obstructive, 0 central and 0 mixed apneas. There were 6 hypopneas and 22 RERAs. The AHI during Stage REM sleep was 2.1 per hour. AHI while supine was 21.2 per hour. The mean oxygen saturation was 92.60%. The minimum SpO2 during sleep was 85.00%. Loud snoring was noted during this study.  CARDIAC DATA The 2 lead EKG demonstrated sinus rhythm. The mean heart rate was 57.15  beats per minute. Other EKG findings include: PVCs.  LEG MOVEMENT DATA The total PLMS were 0 with a resulting PLMS index of 0.00. Associated arousal with leg movement index was 0.0   IMPRESSIONS - Mild obstructive sleep apnea occurred during this study (AHI = 6.8/h). - No significant central sleep apnea occurred during this study (CAI = 0.0/h). - Mild oxygen desaturation was noted during this study (Min O2 = 85.00%). - The patient snored with Loud snoring volume. - EKG findings include PVCs. - Clinically significant periodic limb movements did not occur during sleep. No significant associated arousals  DIAGNOSIS - Obstructive Sleep Apnea (327.23 [G47.33 ICD-10])  RECOMMENDATIONS - Positional therapy avoiding supine position during sleep. - Very mild obstructive sleep apnea. Return to discuss treatment options. - Avoid alcohol, sedatives and other CNS depressants that may worsen sleep apnea and disrupt normal sleep architecture. - Sleep hygiene should be reviewed to assess factors that may improve sleep quality. - Weight management and regular exercise should be initiated or continued if appropriate.  [Electronically signed] 04/27/2016 03:18 PM  Traci Mayford Knife Diplomate, American Board of Sleep Medicine  ELECTRONICALLY SIGNED ON:  04/27/2016, 3:19 PM [facility] PH: [phone]   FX: [phone] ACCREDITED BY THE AMERICAN ACADEMY OF SLEEP MEDICINE

## 2016-05-07 ENCOUNTER — Emergency Department: Payer: Self-pay

## 2016-05-07 ENCOUNTER — Encounter: Payer: Self-pay | Admitting: Emergency Medicine

## 2016-05-07 ENCOUNTER — Emergency Department
Admission: EM | Admit: 2016-05-07 | Discharge: 2016-05-07 | Disposition: A | Discharge status: Home / Self Care | Payer: Self-pay | Attending: Emergency Medicine | Admitting: Emergency Medicine

## 2016-05-07 DIAGNOSIS — I2510 Atherosclerotic heart disease of native coronary artery without angina pectoris: Secondary | ICD-10-CM | POA: Insufficient documentation

## 2016-05-07 DIAGNOSIS — K859 Acute pancreatitis without necrosis or infection, unspecified: Secondary | ICD-10-CM | POA: Insufficient documentation

## 2016-05-07 DIAGNOSIS — Z79899 Other long term (current) drug therapy: Secondary | ICD-10-CM | POA: Insufficient documentation

## 2016-05-07 DIAGNOSIS — I10 Essential (primary) hypertension: Secondary | ICD-10-CM | POA: Insufficient documentation

## 2016-05-07 DIAGNOSIS — I252 Old myocardial infarction: Secondary | ICD-10-CM | POA: Insufficient documentation

## 2016-05-07 DIAGNOSIS — Z791 Long term (current) use of non-steroidal anti-inflammatories (NSAID): Secondary | ICD-10-CM | POA: Insufficient documentation

## 2016-05-07 DIAGNOSIS — R109 Unspecified abdominal pain: Secondary | ICD-10-CM

## 2016-05-07 DIAGNOSIS — F17210 Nicotine dependence, cigarettes, uncomplicated: Secondary | ICD-10-CM | POA: Insufficient documentation

## 2016-05-07 DIAGNOSIS — Z7982 Long term (current) use of aspirin: Secondary | ICD-10-CM | POA: Insufficient documentation

## 2016-05-07 LAB — COMPREHENSIVE METABOLIC PANEL
ALBUMIN: 3.7 g/dL (ref 3.5–5.0)
ALK PHOS: 104 U/L (ref 38–126)
ALT: 20 U/L (ref 17–63)
AST: 25 U/L (ref 15–41)
Anion gap: 3 — ABNORMAL LOW (ref 5–15)
BUN: 11 mg/dL (ref 6–20)
CALCIUM: 8.8 mg/dL — AB (ref 8.9–10.3)
CHLORIDE: 106 mmol/L (ref 101–111)
CO2: 30 mmol/L (ref 22–32)
CREATININE: 0.81 mg/dL (ref 0.61–1.24)
GFR calc non Af Amer: >60 mL/min (ref >60)
Glucose, Bld: 103 mg/dL — ABNORMAL HIGH (ref 65–99)
Potassium: 3.3 mmol/L — ABNORMAL LOW (ref 3.5–5.1)
Sodium: 139 mmol/L (ref 135–145)
Total Bilirubin: 0.4 mg/dL (ref 0.3–1.2)
Total Protein: 7.3 g/dL (ref 6.5–8.1)

## 2016-05-07 LAB — URINALYSIS COMPLETE WITH MICROSCOPIC (ARMC ONLY)
BILIRUBIN URINE: NEGATIVE
Bacteria, UA: NONE SEEN
GLUCOSE, UA: NEGATIVE mg/dL
Hgb urine dipstick: NEGATIVE
KETONES UR: NEGATIVE mg/dL
Leukocytes, UA: NEGATIVE
Nitrite: NEGATIVE
PROTEIN: NEGATIVE mg/dL
SPECIFIC GRAVITY, URINE: 1.023 (ref 1.005–1.030)
pH: 5 (ref 5.0–8.0)

## 2016-05-07 LAB — CK: Total CK: 286 U/L (ref 49–397)

## 2016-05-07 LAB — CBC
HCT: 45.6 % (ref 40.0–52.0)
Hemoglobin: 15.9 g/dL (ref 13.0–18.0)
MCH: 30.7 pg (ref 26.0–34.0)
MCHC: 34.8 g/dL (ref 32.0–36.0)
MCV: 88.3 fL (ref 80.0–100.0)
PLATELETS: 228 10*3/uL (ref 150–440)
RBC: 5.17 MIL/uL (ref 4.40–5.90)
RDW: 13.1 % (ref 11.5–14.5)
WBC: 11.4 10*3/uL — ABNORMAL HIGH (ref 3.8–10.6)

## 2016-05-07 LAB — LIPASE, BLOOD: LIPASE: 99 U/L — AB (ref 11–51)

## 2016-05-07 MED ORDER — IOPAMIDOL (ISOVUE-300) INJECTION 61%
100.0000 mL | Freq: Once | INTRAVENOUS | Status: AC | PRN
  Administered 2016-05-07: 100 mL via INTRAVENOUS

## 2016-05-07 MED ORDER — OXYCODONE-ACETAMINOPHEN 5-325 MG PO TABS: 1.0000 | ORAL_TABLET | ORAL | 0 refills | Status: DC | PRN

## 2016-05-07 MED ORDER — POTASSIUM CHLORIDE CRYS ER 20 MEQ PO TBCR
40.0000 meq | EXTENDED_RELEASE_TABLET | Freq: Once | ORAL | Status: AC
  Administered 2016-05-07: 40 meq via ORAL
  Filled 2016-05-07: qty 2

## 2016-05-07 MED ORDER — ONDANSETRON 4 MG PO TBDP: 4.0000 mg | ORAL_TABLET | Freq: Three times a day (TID) | ORAL | 0 refills | Status: DC | PRN

## 2016-05-07 MED ORDER — DIATRIZOATE MEGLUMINE & SODIUM 66-10 % PO SOLN
15.0000 mL | Freq: Once | ORAL | Status: AC
  Administered 2016-05-07: 15 mL via ORAL

## 2016-05-07 MED ORDER — SODIUM CHLORIDE 0.9 % IV BOLUS (SEPSIS)
1000.0000 mL | Freq: Once | INTRAVENOUS | Status: AC
  Administered 2016-05-07: 1000 mL via INTRAVENOUS

## 2016-05-07 NOTE — ED Provider Notes (Signed)
[facility] Emergency Department Provider Note   ____________________________________________   First MD Initiated Contact with Patient 05/07/16 [phone]     (approximate)  I have reviewed the triage vital signs and the nursing notes.   HISTORY  Chief Complaint Abdominal Pain    HPI Kenneth Medina is a 45 y.o. male who presents to the ED from home with a chief complain of abdominal pain. Patient is a Designer, industrial/product in a very hot environment and states he felt generally unwell and fatigued 4 days ago. Had a lack of appetite and mild diarrhea. Yesterday developed onset of right lower quadrant abdominal pain associated with 1 episode of vomiting. Without intervention, he is currently pain-free but requesting further evaluation. He did eat dinner last evening. Denies associated fever, chills, chest pain, shortness of breath, dysuria, hematuria. Denies testicular pain or swelling. Denies recent travel or trauma. Nothing makes his symptoms better or worse.   Past Medical History:  Diagnosis Date  . Coronary artery disease    a. s/p ant STEMI 5/15 >> LHC (5/15):  prox D1 30-40%, LAD occluded, prox PDA 20-30%, mid to dist Ant HK, EF 45% >>> PCI:  2.75x22 mm Resolute DES to mid LAD  . Hyperlipidemia   . Hypertension   . Ischemic cardiomyopathy    a. EF 45% at [facility] at time of MI >> b. Echo (5/15):  Apical HK, no clot, EF 50%, mod LVH, normal RVF  . MI (myocardial infarction) (HCC)    2015  . Tobacco abuse     Patient Active Problem List   Diagnosis Date Noted  . Hyperlipidemia 02/12/2014  . Cardiomyopathy, ischemic 02/12/2014  . CAD (coronary artery disease) 01/24/2014  . Tobacco use 01/24/2014  . ST elevation (STEMI) myocardial infarction involving left anterior descending coronary artery (HCC) 01/23/2014    Past Surgical History:  Procedure Laterality Date  . LEFT HEART CATHETERIZATION WITH CORONARY ANGIOGRAM N/A 01/23/2014   Procedure: LEFT HEART  CATHETERIZATION WITH CORONARY ANGIOGRAM;  Surgeon: Micheline Chapman, MD;  Location: [facility] C[facility] LAB;  Service: Cardiovascular;  Laterality: N/A;    Prior to Admission medications   Medication Sig Start Date End Date Taking? Authorizing Provider  aspirin 81 MG chewable tablet Chew 1 tablet (81 mg total) by mouth daily. 01/25/14   Brittainy Sherlynn Carbon, PA-C  atorvastatin (LIPITOR) 80 MG tablet Take 1 tablet (80 mg total) by mouth daily at 6 PM. 02/24/16   Tonny Bollman, MD  carvedilol (COREG) 6.25 MG tablet Take 1 tablet (6.25 mg total) by mouth 2 (two) times daily with a meal. 02/24/16   Tonny Bollman, MD  clopidogrel (PLAVIX) 75 MG tablet Take 1 tablet (75 mg total) by mouth daily. 06/26/15   Beatrice Lecher, PA-C  cyclobenzaprine (FLEXERIL) 10 MG tablet Take 1 tablet (10 mg total) by mouth every 8 (eight) hours as needed for muscle spasms. 06/30/15   Governor Rooks, MD  HYDROcodone-acetaminophen (NORCO/VICODIN) 5-325 MG tablet Take 1 tablet by mouth every 6 (six) hours as needed for moderate pain or severe pain. 06/30/15   Governor Rooks, MD  lisinopril (PRINIVIL,ZESTRIL) 2.5 MG tablet Take 1 tablet (2.5 mg total) by mouth daily. 06/26/15   Beatrice Lecher, PA-C  nitroGLYCERIN (NITROSTAT) 0.4 MG SL tablet Place 1 tablet (0.4 mg total) under the tongue every 5 (five) minutes x 3 doses as needed for chest pain. 01/25/14   Brittainy M Simmons, PA-C  ondansetron (ZOFRAN ODT) 4 MG disintegrating tablet Take 1 tablet (4 mg  total) by mouth every 8 (eight) hours as needed for nausea or vomiting. 05/07/16   Irean HongJade J Annaleia Pence, MD  oxyCODONE-acetaminophen (ROXICET) 5-325 MG tablet Take 1 tablet by mouth every 4 (four) hours as needed for severe pain. 05/07/16   Irean HongJade J Aaban Griep, MD    Allergies Ibuprofen  Family History  Problem Relation Age of Onset  . Hypertension Mother   . Cancer Maternal Grandmother   . Cancer Maternal Grandfather   . Heart attack Neg Hx   . Stroke Neg Hx     Social History Social History  Substance  Use Topics  . Smoking status: Current Every Day Smoker    Packs/day: 0.50    Years: 30.00    Types: Cigarettes  . Smokeless tobacco: Never Used  . Alcohol use No    Review of Systems  Constitutional: No fever/chills. Eyes: No visual changes. ENT: No sore throat. Cardiovascular: Denies chest pain. Respiratory: Denies shortness of breath. Gastrointestinal: Positive for abdominal pain, one episode of vomiting and diarrhea.  No constipation. Genitourinary: Negative for dysuria. Musculoskeletal: Negative for back pain. Skin: Negative for rash. Neurological: Negative for headaches, focal weakness or numbness.  10-point ROS otherwise negative.  ____________________________________________   PHYSICAL EXAM:  VITAL SIGNS: ED Triage Vitals  Enc Vitals Group     BP 05/07/16 0032 124/69     Pulse Rate 05/07/16 0032 72     Resp 05/07/16 0032 18     Temp 05/07/16 0032 98.1 F (36.7 C)     Temp Source 05/07/16 0032 Oral     SpO2 05/07/16 0032 98 %     Weight 05/07/16 0033 195 lb (88.5 kg)     Height 05/07/16 0033 5\' 9"  (1.753 m)     Head Circumference --      Peak Flow --      Pain Score 05/07/16 0036 3     Pain Loc --      Pain Edu? --      Excl. in GC? --     Constitutional: Alert and oriented. Well appearing and in no acute distress. Eyes: Conjunctivae are normal. PERRL. EOMI. Head: Atraumatic. Nose: No congestion/rhinnorhea. Mouth/Throat: Mucous membranes are moist.  Oropharynx non-erythematous. Neck: No stridor.   Cardiovascular: Normal rate, regular rhythm. Grossly normal heart sounds.  Good peripheral circulation. Respiratory: Normal respiratory effort.  No retractions. Lungs CTAB. Gastrointestinal: Soft and nontender to light or deep palpation. No distention. No abdominal bruits. No CVA tenderness. Musculoskeletal: No lower extremity tenderness nor edema.  No joint effusions. Neurologic:  Normal speech and language. No gross focal neurologic deficits are  appreciated. No gait instability. Skin:  Skin is warm, dry and intact. No rash noted. Psychiatric: Mood and affect are normal. Speech and behavior are normal.  ____________________________________________   LABS (all labs ordered are listed, but only abnormal results are displayed)  Labs Reviewed  LIPASE, BLOOD - Abnormal; Notable for the following:       Result Value   Lipase 99 (*)    All other components within normal limits  COMPREHENSIVE METABOLIC PANEL - Abnormal; Notable for the following:    Potassium 3.3 (*)    Glucose, Bld 103 (*)    Calcium 8.8 (*)    Anion gap 3 (*)    All other components within normal limits  CBC - Abnormal; Notable for the following:    WBC 11.4 (*)    All other components within normal limits  URINALYSIS COMPLETEWITH MICROSCOPIC ([facility] ONLY) - Abnormal; Notable  for the following:    Color, Urine YELLOW (*)    APPearance CLEAR (*)    Squamous Epithelial / LPF 0-5 (*)    All other components within normal limits  CK   ____________________________________________  EKG  None ____________________________________________  RADIOLOGY  CT abdomen and pelvis interpreted per Dr. Manus Gunning: 1. Fullness the pancreatic parenchyma, which in the setting of  elevated lipase, may reflect early pancreatitis. No peripancreatic  fat stranding or inflammation.  2. There is otherwise no acute abnormality. Particularly, normal  appendix.  3. Mild aortic atherosclerosis. No aneurysm.   ____________________________________________   PROCEDURES  Procedure(s) performed: None  Procedures  Critical Care performed: No  ____________________________________________   INITIAL IMPRESSION / ASSESSMENT AND PLAN / ED COURSE  Pertinent labs & imaging results that were available during my care of the patient were reviewed by me and considered in my medical decision making (see chart for details).  45 year old male who presents with a one-day history of right  lower quadrant abdominal pain associated with 1 episode of vomiting and diarrhea. Laboratory and urinalysis results remarkable for mild leukocytosis, mild pancreatitis and mild hypokalemia. Although patient is nontender to palpation currently, his history is concerning for appendicitis. Especially in the setting of a pain-free period, I would be concerned for perforation. For this reason, we will proceed with CT abdomen/pelvis to evaluate for appendicitis.  Clinical Course  Comment By Time  Updated patient of CT imaging results. Will prescribe a limited quantity of analgesia for pancreatitis, encourage patient to eat a bland diet, and to follow-up with his PCP in the next few days. Strict return precautions given. Patient verbalizes understanding and agrees with plan of care. Irean Hong, MD 08/24 [phone]     ____________________________________________   FINAL CLINICAL IMPRESSION(S) / ED DIAGNOSES  Final diagnoses:  Abdominal pain, unspecified abdominal location  Acute pancreatitis, unspecified pancreatitis type      NEW MEDICATIONS STARTED DURING THIS VISIT:  New Prescriptions   ONDANSETRON (ZOFRAN ODT) 4 MG DISINTEGRATING TABLET    Take 1 tablet (4 mg total) by mouth every 8 (eight) hours as needed for nausea or vomiting.   OXYCODONE-ACETAMINOPHEN (ROXICET) 5-325 MG TABLET    Take 1 tablet by mouth every 4 (four) hours as needed for severe pain.     Note:  This document was prepared using Dragon voice recognition software and may include unintentional dictation errors.    Irean Hong, MD 05/07/16 [phone]

## 2016-05-07 NOTE — Discharge Instructions (Signed)
1. You may take pain and nausea medicines as needed (Percocet/Zofran #15). 2. Eat a bland diet for the next 5 days, then slowly advance diet as tolerated. Avoid heavy, greasy, spicy meals and alcohol. 3. Return to the ER for worsening symptoms, persistent vomiting, fever or other concerns.

## 2016-05-07 NOTE — ED Triage Notes (Signed)
Patient ambulatory to triage with steady gait, without difficulty or distress noted; pt reports V x 1, D x 1 with lower abd pain

## 2016-05-14 ENCOUNTER — Encounter: Payer: Self-pay | Admitting: Physician Assistant

## 2016-05-28 ENCOUNTER — Ambulatory Visit: Payer: Self-pay | Admitting: Physician Assistant

## 2016-05-31 DIAGNOSIS — G4730 Sleep apnea, unspecified: Secondary | ICD-10-CM | POA: Insufficient documentation

## 2016-05-31 DIAGNOSIS — I10 Essential (primary) hypertension: Secondary | ICD-10-CM | POA: Insufficient documentation

## 2016-05-31 NOTE — Progress Notes (Signed)
Cardiology Office Note:    Date:  06/01/2016   ID:  Kenneth Medina, DOB June 26, 1971, MRN [id number]  PCP:  Lonni Fix, PA-C at [facility] [place], [place])  Cardiologist:  Dr. Tonny Bollman   Electrophysiologist:  n/a  Referring MD: Tonny Bollman, MD   Chief Complaint  Patient presents with  . Follow-up    CAD   History of Present Illness:    Kenneth Medina is a 45 y.o. male with a hx of CAD s/p anterior STEMI treated with DES to mid LAD 01/2014, ischemic CM, HL.  He was seen by Dr. Excell Seltzer in 6/17. He had some chest pain and shortness of breath. Follow-up echo demonstrated normal LV function and normal diastolic function. Nuclear stress test was also low risk without ischemia. He returns for follow-up. Since last seen, he has done well. He denies chest pain, dyspnea, orthopnea, PND or edema. He notes occasional episodes of "zoning out." He cannot really tell me if he can hear what is going on around him. He denies losing consciousness. He denies having episodes while driving or having an accident.    Prior CV studies that were reviewed today include:    Nuc Stress Study 03/12/16 No reversible ischemia. Inability to reach THR with exercise, despite 9 METS -> converted to lexiscan. LVEF 53% with normal wall motion. This is a low risk study.  Echo 03/12/16 EF 55-60%, normal wall motion, normal diastolic function  LHC (5/15):  prox D1 30-40%, LAD occluded, prox PDA 20-30%, mid to dist Ant HK, EF 45% >>> PCI: 2.75x22 mm Resolute DES to mid LAD  Echo (5/15):  Apical HK, no clot, EF 50%, mod LVH, normal RVF  Past Medical History:  Diagnosis Date  . Coronary artery disease    a. s/p ant STEMI 5/15 >> LHC (5/15):  prox D1 30-40%, LAD occluded, prox PDA 20-30%, mid to dist Ant HK, EF 45% >>> PCI:  2.75x22 mm Resolute DES to mid LAD  . Hyperlipidemia   . Hypertension   . Ischemic cardiomyopathy    a. EF 45% at [facility] at time of MI >> b. Echo (5/15):  Apical  HK, no clot, EF 50%, mod LVH, normal RVF  . MI (myocardial infarction) (HCC)    2015  . Tobacco abuse     Past Surgical History:  Procedure Laterality Date  . LEFT HEART CATHETERIZATION WITH CORONARY ANGIOGRAM N/A 01/23/2014   Procedure: LEFT HEART CATHETERIZATION WITH CORONARY ANGIOGRAM;  Surgeon: Micheline Chapman, MD;  Location: [facility] CATH LAB;  Service: Cardiovascular;  Laterality: N/A;    Current Medications: Outpatient Medications Prior to Visit  Medication Sig Dispense Refill  . aspirin 81 MG chewable tablet Chew 1 tablet (81 mg total) by mouth daily.    Marland Kitchen atorvastatin (LIPITOR) 80 MG tablet Take 1 tablet (80 mg total) by mouth daily at 6 PM. 30 tablet 5  . carvedilol (COREG) 6.25 MG tablet Take 1 tablet (6.25 mg total) by mouth 2 (two) times daily with a meal. 60 tablet 5  . clopidogrel (PLAVIX) 75 MG tablet Take 1 tablet (75 mg total) by mouth daily. 30 tablet 11  . lisinopril (PRINIVIL,ZESTRIL) 2.5 MG tablet Take 1 tablet (2.5 mg total) by mouth daily. 30 tablet 11  . nitroGLYCERIN (NITROSTAT) 0.4 MG SL tablet Place 1 tablet (0.4 mg total) under the tongue every 5 (five) minutes x 3 doses as needed for chest pain. 25 tablet 2  . ondansetron (ZOFRAN ODT) 4 MG disintegrating tablet  Take 1 tablet (4 mg total) by mouth every 8 (eight) hours as needed for nausea or vomiting. 15 tablet 0  . cyclobenzaprine (FLEXERIL) 10 MG tablet Take 1 tablet (10 mg total) by mouth every 8 (eight) hours as needed for muscle spasms. 20 tablet 0  . HYDROcodone-acetaminophen (NORCO/VICODIN) 5-325 MG tablet Take 1 tablet by mouth every 6 (six) hours as needed for moderate pain or severe pain. 8 tablet 0  . oxyCODONE-acetaminophen (ROXICET) 5-325 MG tablet Take 1 tablet by mouth every 4 (four) hours as needed for severe pain. 15 tablet 0   No facility-administered medications prior to visit.       Allergies:   Ibuprofen   Social History   Social History  . Marital status: Married    Spouse name: N/A  .  Number of children: N/A  . Years of education: N/A   Social History Main Topics  . Smoking status: Current Every Day Smoker    Packs/day: 0.50    Years: 30.00    Types: Cigarettes  . Smokeless tobacco: Never Used  . Alcohol use No  . Drug use: No  . Sexual activity: Not Asked   Other Topics Concern  . None   Social History Narrative   The patient is married. He works as a Surveyor, [employer] for [employer]. He does not drink alcohol or use illicit drugs. He does smoke cigarettes one half pack per day.     Family History:  The patient's family history includes Cancer in his maternal grandfather and maternal grandmother; Hypertension in his mother.   ROS:   Please see the history of present illness.    Review of Systems  Cardiovascular: Positive for dyspnea on exertion and irregular heartbeat.  Respiratory: Positive for shortness of breath and snoring.   Neurological: Positive for dizziness.  Psychiatric/Behavioral: The patient is nervous/anxious.    All other systems reviewed and are negative.   EKGs/Labs/Other Test Reviewed:    EKG:  EKG is   The ekg ordered today demonstrates NSR, HR 75, normal axis, no ST changes, QTc 417 ms ordered today.  Recent Labs: 05/07/2016: ALT 20; BUN 11; Creatinine, Ser 0.81; Hemoglobin 15.9; Platelets 228; Potassium 3.3; Sodium 139   Recent Lipid Panel    Component Value Date/Time   CHOL 105 03/19/2014 1116   TRIG 98.0 03/19/2014 1116   HDL 29.40 (L) 03/19/2014 1116   CHOLHDL 4 03/19/2014 1116   VLDL 19.6 03/19/2014 1116   LDLCALC 56 03/19/2014 1116     Physical Exam:    VS:  BP 120/76   Pulse 75   Ht 5\' 9"  (1.753 m)   Wt 188 lb 6.4 oz (85.5 kg)   BMI 27.82 kg/m     Wt Readings from Last 3 Encounters:  06/01/16 188 lb 6.4 oz (85.5 kg)  05/07/16 195 lb (88.5 kg)  04/26/16 195 lb (88.5 kg)     Physical Exam  Constitutional: He is oriented to person, place, and time. He appears well-developed and well-nourished. No distress.    HENT:  Head: Normocephalic and atraumatic.  Eyes: No scleral icterus.  Neck: No JVD present.  Cardiovascular: Normal rate, regular rhythm and normal heart sounds.   No murmur heard. Pulmonary/Chest: Effort normal. He has no wheezes. He has no rales.  Abdominal: Soft. There is no tenderness.  Musculoskeletal: He exhibits no edema.  Neurological: He is alert and oriented to person, place, and time.  Skin: Skin is warm and dry.  Psychiatric: He has  a normal mood and affect.    ASSESSMENT:    1. Coronary artery disease involving native coronary artery of native heart without angina pectoris   2. Hyperlipidemia   3. Essential hypertension   4. Mild sleep apnea   5. Altered level of consciousness   6. Tobacco use    PLAN:    In order of problems listed above:  1.Coronary artery disease: Status post anterior STEMI treated with DES to the LAD in 5/15.  He is overall doing well.  His ejection fraction is now normal. He denies anginal symptoms. Continue current regimen which includes aspirin, Plavix, carvedilol, atorvastatin, lisinopril.  2. Hyperlipidemia: Continue statin.Arrange follow-up LFTs.  3. HTN - Blood pressure is controlled. Continue current regimen. Arrange follow-up BMET.      4. Sleep apnea - He has mild sleep apnea. He has an appointment pending with Dr. Mayford Knifeurner to discuss treatment options.  5. Altered levels of consciousness - He describes episodes where he "zones out." Etiology of his symptoms are not entirely clear. They do not sound cardiac. I will refer him to neurology for further evaluation.  6. Tobacco abuse - We discussed the importance of cessation. He is willing to try Chantix. We discussed the minimal risk in patients with cardiovascular disease. Overall, this seems to be low. He denies any history of psychiatric issues. I recommended that he start Chantix a couple of weeks before his quit date and continue for 2-3 months after quitting.   Medication  Adjustments/Labs and Tests Ordered: Current medicines are reviewed at length with the patient today.  Concerns regarding medicines are outlined above.  Medication changes, Labs and Tests ordered today are outlined in the Patient Instructions noted below. Patient Instructions  Medication Instructions:  START CHANTIX STARTER PACK AND MAINTANCE PACK  Labwork: 1. BMET, FASTING LIPID AND LIVER PANEL TO BE DONE IN 6 MONTHS 1 WEEK BEFORE YOUR FOLLOW UP APPT WITH DR. Excell SeltzerOOPER  Testing/Procedures: NONE  Follow-Up: Your physician wants you to follow-up in: 6 MONTHS WITH DR. Theodoro ParmaOOPER You will receive a reminder letter in the mail two months in advance. If you don't receive a letter, please call our office to schedule the follow-up appointment.  YOU ARE BEING REFERRED TO [facility] ALTERED LEVELS OF CONSCIOUSNESS    Any Other Special Instructions Will Be Listed Below (If Applicable).  If you need a refill on your cardiac medications before your next appointment, please call your pharmacy.  Signed, Tereso NewcomerScott Amila Callies, PA-C  06/01/2016 5:56 PM    [facility] [address], [place], [place]  [address] Phone: [phone]) [phone]; Fax: [phone]) [phone]

## 2016-06-01 ENCOUNTER — Encounter: Payer: Self-pay | Admitting: Physician Assistant

## 2016-06-01 ENCOUNTER — Ambulatory Visit (INDEPENDENT_AMBULATORY_CARE_PROVIDER_SITE_OTHER): Payer: Self-pay | Admitting: Physician Assistant

## 2016-06-01 ENCOUNTER — Encounter (INDEPENDENT_AMBULATORY_CARE_PROVIDER_SITE_OTHER): Payer: Self-pay

## 2016-06-01 VITALS — BP 120/76 | HR 75 | Ht 69.0 in | Wt 188.4 lb

## 2016-06-01 DIAGNOSIS — I10 Essential (primary) hypertension: Secondary | ICD-10-CM

## 2016-06-01 DIAGNOSIS — E785 Hyperlipidemia, unspecified: Secondary | ICD-10-CM

## 2016-06-01 DIAGNOSIS — Z720 Tobacco use: Secondary | ICD-10-CM

## 2016-06-01 DIAGNOSIS — I2510 Atherosclerotic heart disease of native coronary artery without angina pectoris: Secondary | ICD-10-CM

## 2016-06-01 DIAGNOSIS — G4730 Sleep apnea, unspecified: Secondary | ICD-10-CM

## 2016-06-01 DIAGNOSIS — R404 Transient alteration of awareness: Secondary | ICD-10-CM

## 2016-06-01 MED ORDER — VARENICLINE TARTRATE 1 MG PO TABS: 1.0000 mg | ORAL_TABLET | Freq: Two times a day (BID) | ORAL | 3 refills | Status: DC

## 2016-06-01 MED ORDER — VARENICLINE TARTRATE 0.5 MG X 11 & 1 MG X 42 PO MISC: ORAL | 0 refills | Status: DC

## 2016-06-01 NOTE — Patient Instructions (Addendum)
Medication Instructions:  START CHANTIX STARTER PACK AND MAINTANCE PACK  Labwork: 1. BMET, FASTING LIPID AND LIVER PANEL TO BE DONE IN 6 MONTHS 1 WEEK BEFORE YOUR FOLLOW UP APPT WITH DR. Excell Seltzer  Testing/Procedures: NONE  Follow-Up: Your physician wants you to follow-up in: 6 MONTHS WITH DR. Theodoro Parma will receive a reminder letter in the mail two months in advance. If you don't receive a letter, please call our office to schedule the follow-up appointment.  YOU ARE BEING REFERRED TO [facility] ALTERED LEVELS OF CONSCIOUSNESS    Any Other Special Instructions Will Be Listed Below (If Applicable).  If you need a refill on your cardiac medications before your next appointment, please call your pharmacy.

## 2016-06-04 ENCOUNTER — Encounter: Payer: Self-pay | Admitting: Cardiology

## 2016-06-18 ENCOUNTER — Ambulatory Visit: Payer: Self-pay | Admitting: Cardiology

## 2016-07-10 ENCOUNTER — Ambulatory Visit: Payer: Self-pay | Admitting: Cardiology

## 2016-07-14 ENCOUNTER — Encounter: Payer: Self-pay | Admitting: Cardiology

## 2016-07-23 ENCOUNTER — Ambulatory Visit: Payer: Self-pay | Admitting: Neurology

## 2016-07-31 ENCOUNTER — Encounter: Payer: Self-pay | Admitting: Neurology

## 2016-08-17 ENCOUNTER — Other Ambulatory Visit: Payer: Self-pay | Admitting: Physician Assistant

## 2016-08-17 DIAGNOSIS — I2510 Atherosclerotic heart disease of native coronary artery without angina pectoris: Secondary | ICD-10-CM

## 2016-08-17 DIAGNOSIS — I255 Ischemic cardiomyopathy: Secondary | ICD-10-CM

## 2016-08-21 ENCOUNTER — Emergency Department
Admission: EM | Admit: 2016-08-21 | Discharge: 2016-08-21 | Disposition: A | Discharge status: Home / Self Care | Payer: Self-pay | Attending: Emergency Medicine | Admitting: Emergency Medicine

## 2016-08-21 ENCOUNTER — Encounter: Payer: Self-pay | Admitting: Emergency Medicine

## 2016-08-21 DIAGNOSIS — F17210 Nicotine dependence, cigarettes, uncomplicated: Secondary | ICD-10-CM | POA: Insufficient documentation

## 2016-08-21 DIAGNOSIS — I10 Essential (primary) hypertension: Secondary | ICD-10-CM | POA: Insufficient documentation

## 2016-08-21 DIAGNOSIS — S29012A Strain of muscle and tendon of back wall of thorax, initial encounter: Secondary | ICD-10-CM | POA: Insufficient documentation

## 2016-08-21 DIAGNOSIS — Y929 Unspecified place or not applicable: Secondary | ICD-10-CM | POA: Insufficient documentation

## 2016-08-21 DIAGNOSIS — S29019A Strain of muscle and tendon of unspecified wall of thorax, initial encounter: Secondary | ICD-10-CM

## 2016-08-21 DIAGNOSIS — Y939 Activity, unspecified: Secondary | ICD-10-CM | POA: Insufficient documentation

## 2016-08-21 DIAGNOSIS — I2510 Atherosclerotic heart disease of native coronary artery without angina pectoris: Secondary | ICD-10-CM | POA: Insufficient documentation

## 2016-08-21 DIAGNOSIS — X58XXXA Exposure to other specified factors, initial encounter: Secondary | ICD-10-CM | POA: Insufficient documentation

## 2016-08-21 DIAGNOSIS — Z79899 Other long term (current) drug therapy: Secondary | ICD-10-CM | POA: Insufficient documentation

## 2016-08-21 DIAGNOSIS — Y999 Unspecified external cause status: Secondary | ICD-10-CM | POA: Insufficient documentation

## 2016-08-21 DIAGNOSIS — M62838 Other muscle spasm: Secondary | ICD-10-CM | POA: Insufficient documentation

## 2016-08-21 MED ORDER — TRAMADOL HCL 50 MG PO TABS: 50.0000 mg | ORAL_TABLET | Freq: Two times a day (BID) | ORAL | 0 refills | Status: DC

## 2016-08-21 MED ORDER — CYCLOBENZAPRINE HCL 5 MG PO TABS: 5.0000 mg | ORAL_TABLET | Freq: Three times a day (TID) | ORAL | 0 refills | Status: DC | PRN

## 2016-08-21 NOTE — ED Provider Notes (Signed)
[facility] Emergency Department Provider Note ____________________________________________  Time seen: 1402  I have reviewed the triage vital signs and the nursing notes.  HISTORY  Chief Complaint  Neck Pain  HPI Kenneth Medina is a 45 y.o. male presents to the ED for evaluation of posterior right scapulothoracic muscle pain. He describes waking up with pain to the base of the neck just at the right shoulder blade. He also describes awaking from sleep feeling a sense of vertigo. He denies headache, nausea, or vision changes. He denies preceding injury, trauma, or "sleeping wrong." He denies distal paresthesias, grip changes, chest pain, SOB, diaphoresis, or syncope. He reports tenderness to touch without referral. He went to work this morning [place].   Past Medical History:  Diagnosis Date  . Coronary artery disease    a. s/p ant STEMI 5/15 >> LHC (5/15):  prox D1 30-40%, LAD occluded, prox PDA 20-30%, mid to dist Ant HK, EF 45% >>> PCI:  2.75x22 mm Resolute DES to mid LAD  . Hyperlipidemia   . Hypertension   . Ischemic cardiomyopathy    a. EF 45% at [facility] at time of MI >> b. Echo (5/15):  Apical HK, no clot, EF 50%, mod LVH, normal RVF  . MI (myocardial infarction)    2015  . Tobacco abuse     Patient Active Problem List   Diagnosis Date Noted  . Essential hypertension 05/31/2016  . Mild sleep apnea 05/31/2016  . Hyperlipidemia 02/12/2014  . Cardiomyopathy, ischemic 02/12/2014  . CAD (coronary artery disease) 01/24/2014  . Tobacco use 01/24/2014  . History of acute anterior wall MI 01/23/2014    Past Surgical History:  Procedure Laterality Date  . LEFT HEART CATHETERIZATION WITH CORONARY ANGIOGRAM N/A 01/23/2014   Procedure: LEFT HEART CATHETERIZATION WITH CORONARY ANGIOGRAM;  Surgeon: Micheline Chapman, MD;  Location: [facility] CATH LAB;  Service: Cardiovascular;  Laterality: N/A;    Prior to Admission  medications   Medication Sig Start Date End Date Taking? Authorizing Provider  aspirin 81 MG chewable tablet Chew 1 tablet (81 mg total) by mouth daily. 01/25/14   Brittainy Sherlynn Carbon, PA-C  atorvastatin (LIPITOR) 80 MG tablet Take 1 tablet (80 mg total) by mouth daily at 6 PM. 02/24/16   Tonny Bollman, MD  carvedilol (COREG) 6.25 MG tablet Take 1 tablet (6.25 mg total) by mouth 2 (two) times daily with a meal. 02/24/16   Tonny Bollman, MD  clopidogrel (PLAVIX) 75 MG tablet TAKE 1 TABLET BY MOUTH DAILY 08/18/16   Beatrice Lecher, PA-C  cyclobenzaprine (FLEXERIL) 5 MG tablet Take 1 tablet (5 mg total) by mouth 3 (three) times daily as needed for muscle spasms. 08/21/16   Maleiyah Releford V Bacon Paislei Dorval, PA-C  lisinopril (PRINIVIL,ZESTRIL) 2.5 MG tablet TAKE 1 TABLET (2.5 MG TOTAL) BY MOUTH DAILY. 08/18/16   Beatrice Lecher, PA-C  nitroGLYCERIN (NITROSTAT) 0.4 MG SL tablet Place 1 tablet (0.4 mg total) under the tongue every 5 (five) minutes x 3 doses as needed for chest pain. 01/25/14   Brittainy Sherlynn Carbon, PA-C  ondansetron (ZOFRAN ODT) 4 MG disintegrating tablet Take 1 tablet (4 mg total) by mouth every 8 (eight) hours as needed for nausea or vomiting. 05/07/16   Irean Hong, MD  traMADol (ULTRAM) 50 MG tablet Take 1 tablet (50 mg total) by mouth 2 (two) times daily. 08/21/16   Cloys Vera V Bacon Graydon Fofana, PA-C  varenicline (CHANTIX CONTINUING MONTH PAK) 1 MG  tablet Take 1 tablet (1 mg total) by mouth 2 (two) times daily. 06/01/16   Beatrice LecherScott T Weaver, PA-C  varenicline (CHANTIX STARTING MONTH PAK) 0.5 MG X 11 & 1 MG X 42 tablet Take one 0.5 mg tablet by mouth once daily for 3 days, then increase to one 0.5 mg tablet twice daily for 4 days, then increase to one 1 mg tablet twice daily. 06/01/16   Beatrice LecherScott T Weaver, PA-C   Allergies Ibuprofen  Family History  Problem Relation Age of Onset  . Hypertension Mother   . Cancer Maternal Grandmother   . Cancer Maternal Grandfather   . Heart attack Neg Hx   . Stroke Neg Hx      Social History Social History  Substance Use Topics  . Smoking status: Current Every Day Smoker    Packs/day: 0.50    Years: 30.00    Types: Cigarettes  . Smokeless tobacco: Never Used  . Alcohol use No   Review of Systems  Constitutional: Negative for fever. Cardiovascular: Negative for chest pain or diaphoresis Respiratory: Negative for shortness of breath, cough, or wheezing. Gastrointestinal: Negative for abdominal pain, vomiting and diarrhea. Genitourinary: Negative for dysuria. Musculoskeletal: Negative for back pain. Neck pain as above. Skin: Negative for rash. Neurological: Negative for headaches, focal weakness or numbness. ____________________________________________  PHYSICAL EXAM:  VITAL SIGNS: ED Triage Vitals  Enc Vitals Group     BP 08/21/16 1331 120/79     Pulse Rate 08/21/16 1331 72     Resp 08/21/16 1331 20     Temp 08/21/16 1331 98.2 F (36.8 C)     Temp Source 08/21/16 1331 Oral     SpO2 08/21/16 1331 98 %     Weight 08/21/16 1332 188 lb (85.3 kg)     Height --      Head Circumference --      Peak Flow --      Pain Score 08/21/16 1332 7     Pain Loc --      Pain Edu? --      Excl. in GC? --    Constitutional: Alert and oriented. Well appearing and in no distress. Head: Normocephalic and atraumatic. Cardiovascular: Normal rate, regular rhythm. Normal distal pulses. Respiratory: Normal respiratory effort. No wheezes/rales/rhonchi. Gastrointestinal: Soft and nontender. No distention, rebound, guarding, or organomegaly. Musculoskeletal: Normal spinal alignment without midline tenderness, spasm, deformity, or step-off. Patient with tenderness to palpation to the right scapulothoracic region. Tenderness seems to be reproducible with palpation over the C7-T1 region of the right scapula. Normal rotator cuff testing. Normal composite grips bilaterally. Nontender with normal range of motion in all extremities.  Neurologic:  Cranial nerves II through  XII grossly intact. Normal UE/LE DTRs bilaterally. Normal gait without ataxia. Normal speech and language. No gross focal neurologic deficits are appreciated. Skin:  Skin is warm, dry and intact. No rash noted. ____________________________________________  EKG  NSR 60 bpm Normal S1S2 No STEMI ____________________________________________  INITIAL IMPRESSION / ASSESSMENT AND PLAN / ED COURSE  Patient with what appears to be musculoskeletal tenderness to the right scapular thoracic region. She is tenderness over the trapezius muscle on the right shoulder that is reproducible on exam. No distal paresthesias or neuromuscular deficit and no indication of a cardio pulmonary process. He is reassured by this normal EKG at this time. Patient discharged with prescriptions for Ultram and Flexeril doses directed. He will follow with his primary care provider or local community clinic for ongoing symptom management. Return precautions are  reviewed. A work note is provided for today as requested.  Clinical Course    ____________________________________________  FINAL CLINICAL IMPRESSION(S) / ED DIAGNOSES  Final diagnoses:  Thoracic myofascial strain, initial encounter  Trapezius muscle spasm     Lissa Hoard, PA-C 08/21/16 1734    Jennye Moccasin, MD 08/21/16 1815

## 2016-08-21 NOTE — ED Triage Notes (Signed)
States he woke up with some pain to right side of neck with some dizziness  States pain is non radiating  No trauma

## 2016-08-21 NOTE — Discharge Instructions (Signed)
Your exam and EKG are normal today. Your symptoms appear to be related to musculoskeletal pain as opposed to any serious spinal cord or cardiac cause. Take the prescription meds as directed. Follow-up with Dr. Alben Spittle or return as needed.

## 2016-11-13 ENCOUNTER — Emergency Department: Payer: No Typology Code available for payment source

## 2016-11-13 ENCOUNTER — Encounter: Payer: Self-pay | Admitting: Emergency Medicine

## 2016-11-13 ENCOUNTER — Emergency Department
Admission: EM | Admit: 2016-11-13 | Discharge: 2016-11-13 | Disposition: A | Discharge status: Home / Self Care | Payer: No Typology Code available for payment source | Attending: Emergency Medicine | Admitting: Emergency Medicine

## 2016-11-13 DIAGNOSIS — I10 Essential (primary) hypertension: Secondary | ICD-10-CM | POA: Diagnosis not present

## 2016-11-13 DIAGNOSIS — I2510 Atherosclerotic heart disease of native coronary artery without angina pectoris: Secondary | ICD-10-CM | POA: Insufficient documentation

## 2016-11-13 DIAGNOSIS — Z79899 Other long term (current) drug therapy: Secondary | ICD-10-CM | POA: Insufficient documentation

## 2016-11-13 DIAGNOSIS — S199XXA Unspecified injury of neck, initial encounter: Secondary | ICD-10-CM | POA: Diagnosis present

## 2016-11-13 DIAGNOSIS — Y92410 Unspecified street and highway as the place of occurrence of the external cause: Secondary | ICD-10-CM | POA: Diagnosis not present

## 2016-11-13 DIAGNOSIS — F17210 Nicotine dependence, cigarettes, uncomplicated: Secondary | ICD-10-CM | POA: Diagnosis not present

## 2016-11-13 DIAGNOSIS — Y939 Activity, unspecified: Secondary | ICD-10-CM | POA: Insufficient documentation

## 2016-11-13 DIAGNOSIS — Z7982 Long term (current) use of aspirin: Secondary | ICD-10-CM | POA: Insufficient documentation

## 2016-11-13 DIAGNOSIS — M542 Cervicalgia: Secondary | ICD-10-CM | POA: Insufficient documentation

## 2016-11-13 DIAGNOSIS — Y999 Unspecified external cause status: Secondary | ICD-10-CM | POA: Insufficient documentation

## 2016-11-13 MED ORDER — CYCLOBENZAPRINE HCL 5 MG PO TABS: 5.0000 mg | ORAL_TABLET | Freq: Three times a day (TID) | ORAL | 0 refills | Status: DC | PRN

## 2016-11-13 MED ORDER — CYCLOBENZAPRINE HCL 5 MG PO TABS: 5.0000 mg | ORAL_TABLET | Freq: Three times a day (TID) | ORAL | 0 refills | Status: AC | PRN
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
## 2016-11-17 ENCOUNTER — Emergency Department
Admission: EM | Admit: 2016-11-17 | Discharge: 2016-11-17 | Disposition: A | Discharge status: Home / Self Care | Payer: BLUE CROSS/BLUE SHIELD | Attending: Student in an Organized Health Care Education/Training Program | Admitting: Student in an Organized Health Care Education/Training Program

## 2016-11-17 ENCOUNTER — Encounter: Payer: Self-pay | Admitting: Emergency Medicine

## 2016-11-17 ENCOUNTER — Emergency Department: Payer: BLUE CROSS/BLUE SHIELD

## 2016-11-17 DIAGNOSIS — F17210 Nicotine dependence, cigarettes, uncomplicated: Secondary | ICD-10-CM | POA: Insufficient documentation

## 2016-11-17 DIAGNOSIS — I2510 Atherosclerotic heart disease of native coronary artery without angina pectoris: Secondary | ICD-10-CM | POA: Diagnosis not present

## 2016-11-17 DIAGNOSIS — G589 Mononeuropathy, unspecified: Secondary | ICD-10-CM | POA: Diagnosis not present

## 2016-11-17 DIAGNOSIS — I10 Essential (primary) hypertension: Secondary | ICD-10-CM | POA: Diagnosis not present

## 2016-11-17 DIAGNOSIS — M5410 Radiculopathy, site unspecified: Secondary | ICD-10-CM

## 2016-11-17 DIAGNOSIS — M542 Cervicalgia: Secondary | ICD-10-CM | POA: Diagnosis present

## 2016-11-17 DIAGNOSIS — Z7982 Long term (current) use of aspirin: Secondary | ICD-10-CM | POA: Insufficient documentation

## 2016-11-17 DIAGNOSIS — Z79899 Other long term (current) drug therapy: Secondary | ICD-10-CM | POA: Insufficient documentation

## 2016-11-17 DIAGNOSIS — T148XXA Other injury of unspecified body region, initial encounter: Secondary | ICD-10-CM

## 2016-11-17 DIAGNOSIS — R32 Unspecified urinary incontinence: Secondary | ICD-10-CM | POA: Insufficient documentation

## 2016-11-17 DIAGNOSIS — I252 Old myocardial infarction: Secondary | ICD-10-CM | POA: Insufficient documentation

## 2016-11-17 LAB — COMPREHENSIVE METABOLIC PANEL
ALT: 27 U/L (ref 17–63)
ANION GAP: 6 (ref 5–15)
AST: 31 U/L (ref 15–41)
Albumin: 3.9 g/dL (ref 3.5–5.0)
Alkaline Phosphatase: 107 U/L (ref 38–126)
BUN: 9 mg/dL (ref 6–20)
CO2: 27 mmol/L (ref 22–32)
CREATININE: 0.9 mg/dL (ref 0.61–1.24)
Calcium: 8.6 mg/dL — ABNORMAL LOW (ref 8.9–10.3)
Chloride: 106 mmol/L (ref 101–111)
Glucose, Bld: 102 mg/dL — ABNORMAL HIGH (ref 65–99)
Potassium: 3.9 mmol/L (ref 3.5–5.1)
Sodium: 139 mmol/L (ref 135–145)
Total Bilirubin: 0.3 mg/dL (ref 0.3–1.2)
Total Protein: 7.4 g/dL (ref 6.5–8.1)

## 2016-11-17 LAB — URINALYSIS, COMPLETE (UACMP) WITH MICROSCOPIC
BILIRUBIN URINE: NEGATIVE
Bacteria, UA: NONE SEEN
GLUCOSE, UA: NEGATIVE mg/dL
HGB URINE DIPSTICK: NEGATIVE
Ketones, ur: NEGATIVE mg/dL
Leukocytes, UA: NEGATIVE
NITRITE: NEGATIVE
Protein, ur: NEGATIVE mg/dL
RBC / HPF: NONE SEEN RBC/hpf (ref 0–5)
SPECIFIC GRAVITY, URINE: 1.009 (ref 1.005–1.030)
Squamous Epithelial / LPF: NONE SEEN
pH: 8 (ref 5.0–8.0)

## 2016-11-17 LAB — CBC WITH DIFFERENTIAL/PLATELET
Basophils Absolute: 0.1 10*3/uL (ref 0–0.1)
Basophils Relative: 1 %
EOS ABS: 0.4 10*3/uL (ref 0–0.7)
EOS PCT: 4 %
HCT: 46.5 % (ref 40.0–52.0)
Hemoglobin: 16 g/dL (ref 13.0–18.0)
LYMPHS ABS: 3.4 10*3/uL (ref 1.0–3.6)
Lymphocytes Relative: 34 %
MCH: 30.7 pg (ref 26.0–34.0)
MCHC: 34.4 g/dL (ref 32.0–36.0)
MCV: 89.2 fL (ref 80.0–100.0)
MONO ABS: 0.9 10*3/uL (ref 0.2–1.0)
MONOS PCT: 9 %
Neutro Abs: 5.3 10*3/uL (ref 1.4–6.5)
Neutrophils Relative %: 52 %
PLATELETS: 240 10*3/uL (ref 150–440)
RBC: 5.21 MIL/uL (ref 4.40–5.90)
RDW: 14 % (ref 11.5–14.5)
WBC: 10.1 10*3/uL (ref 3.8–10.6)

## 2016-11-17 MED ORDER — NICOTINE 21 MG/24HR TD PT24
21.0000 mg | MEDICATED_PATCH | Freq: Once | TRANSDERMAL | Status: DC
  Administered 2016-11-17: 21 mg via TRANSDERMAL
  Filled 2016-11-17: qty 1

## 2016-11-17 MED ORDER — PREDNISONE 10 MG (21) PO TBPK: ORAL_TABLET | Freq: Every day | ORAL | 0 refills | Status: AC

## 2016-11-17 MED ORDER — METHYLPREDNISOLONE SODIUM SUCC 125 MG IJ SOLR
125.0000 mg | Freq: Once | INTRAMUSCULAR | Status: AC
  Administered 2016-11-17: 125 mg via INTRAMUSCULAR
  Filled 2016-11-17: qty 2
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
## 2016-11-17 NOTE — ED Notes (Signed)
Pt returns from MRI ° °
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
## 2016-12-03 ENCOUNTER — Other Ambulatory Visit: Payer: Self-pay | Admitting: Physician Assistant

## 2016-12-03 DIAGNOSIS — R0602 Shortness of breath: Secondary | ICD-10-CM

## 2016-12-03 DIAGNOSIS — I255 Ischemic cardiomyopathy: Secondary | ICD-10-CM

## 2016-12-03 DIAGNOSIS — R400 Somnolence: Secondary | ICD-10-CM

## 2016-12-03 MED ORDER — CARVEDILOL 6.25 MG PO TABS
6.2500 mg | ORAL_TABLET | Freq: Two times a day (BID) | ORAL | 5 refills | Status: DC
Start: 2016-12-03 — End: 2017-09-10

## 2017-03-15 ENCOUNTER — Encounter (INDEPENDENT_AMBULATORY_CARE_PROVIDER_SITE_OTHER): Payer: Self-pay

## 2017-03-15 ENCOUNTER — Encounter: Payer: Self-pay | Admitting: Cardiovascular Disease

## 2017-03-15 ENCOUNTER — Ambulatory Visit (INDEPENDENT_AMBULATORY_CARE_PROVIDER_SITE_OTHER): Payer: BLUE CROSS/BLUE SHIELD | Admitting: Cardiovascular Disease

## 2017-03-15 VITALS — BP 110/76 | HR 62 | Ht 69.0 in | Wt 191.8 lb

## 2017-03-15 DIAGNOSIS — I25119 Atherosclerotic heart disease of native coronary artery with unspecified angina pectoris: Secondary | ICD-10-CM

## 2017-03-15 DIAGNOSIS — Z720 Tobacco use: Secondary | ICD-10-CM

## 2017-03-15 DIAGNOSIS — E782 Mixed hyperlipidemia: Secondary | ICD-10-CM

## 2017-03-15 MED ORDER — VARENICLINE TARTRATE 1 MG PO TABS: 1.0000 mg | ORAL_TABLET | Freq: Two times a day (BID) | ORAL | 2 refills | Status: DC

## 2017-03-15 MED ORDER — VARENICLINE TARTRATE 0.5 MG X 11 & 1 MG X 42 PO MISC: ORAL | 0 refills | Status: DC

## 2017-03-15 NOTE — Progress Notes (Signed)
Cardiology Office Note Date:  03/15/2017   ID:  Kenneth Medina, DOB [birth year], MRN [id number]  PCP:  Patient, No Pcp Per  Cardiologist:  Tonny Bollman, MD    Chief Complaint  Patient presents with  . Follow-up     History of Present Illness: [name] is a 46 y.o. male who presents for Follow-up of coronary artery disease. The patient initially presented in 2015 with an anterior STEMI. He was treated with primary PCI the LAD with a drug-eluting stent platform and was noted to have no other obstructive coronary disease. Post MI LVEF was 50%.  The patient is here alone today. He is experiencing some left-sided focal chest discomfort that reminds him of the pain he had at the time of his MI. States that the pain is much less severe. The pain is nonradiating and not related to physical exertion. There is no associated shortness of breath, nausea, or diaphoresis. The patient is compliant with his medications. He continues to smoke cigarettes but would like to quit. He has wanted to try Chantix in the past was uninsured until recently and this was cost prohibitive.   Past Medical History:  Diagnosis Date  . Coronary artery disease    a. s/p ant STEMI 5/15 >> LHC (5/15):  prox D1 30-40%, LAD occluded, prox PDA 20-30%, mid to dist Ant HK, EF 45% >>> PCI:  2.75x22 mm Resolute DES to mid LAD  . Hyperlipidemia   . Hypertension   . Ischemic cardiomyopathy    a. EF 45% at [facility] at time of MI >> b. Echo (5/15):  Apical HK, no clot, EF 50%, mod LVH, normal RVF  . MI (myocardial infarction) (HCC)    2015  . Tobacco abuse     Past Surgical History:  Procedure Laterality Date  . LEFT HEART CATHETERIZATION WITH CORONARY ANGIOGRAM N/A 01/23/2014   Procedure: LEFT HEART CATHETERIZATION WITH CORONARY ANGIOGRAM;  Surgeon: Micheline Chapman, MD;  Location: [facility] CATH LAB;  Service: Cardiovascular;  Laterality: N/A;    Current Outpatient Prescriptions  Medication Sig Dispense Refill  . aspirin 81 MG  chewable tablet Chew 1 tablet (81 mg total) by mouth daily.    Marland Kitchen atorvastatin (LIPITOR) 80 MG tablet Take 1 tablet (80 mg total) by mouth daily at 6 PM. 30 tablet 5  . carvedilol (COREG) 6.25 MG tablet Take 1 tablet (6.25 mg total) by mouth 2 (two) times daily with a meal. 60 tablet 5  . clopidogrel (PLAVIX) 75 MG tablet TAKE 1 TABLET BY MOUTH DAILY 30 tablet 8  . lisinopril (PRINIVIL,ZESTRIL) 2.5 MG tablet TAKE 1 TABLET (2.5 MG TOTAL) BY MOUTH DAILY. 30 tablet 8  . nitroGLYCERIN (NITROSTAT) 0.4 MG SL tablet Place 1 tablet (0.4 mg total) under the tongue every 5 (five) minutes x 3 doses as needed for chest pain. 25 tablet 2  . ondansetron (ZOFRAN ODT) 4 MG disintegrating tablet Take 1 tablet (4 mg total) by mouth every 8 (eight) hours as needed for nausea or vomiting. 15 tablet 0   No current facility-administered medications for this visit.     Allergies:   Ibuprofen   Social History:  The patient  reports that he has been smoking Cigarettes.  He has a 15.00 pack-year smoking history. He has never used smokeless tobacco. He reports that he does not drink alcohol or use drugs.   Family History:  The patient's  family history includes Cancer in his maternal grandfather and maternal grandmother; Hypertension in his mother.  ROS:  Please see the history of present illness.  Otherwise, review of systems is positive for Shortness of breath with activity, back pain, skipped heartbeats, anxiety.  All other systems are reviewed and negative.    PHYSICAL EXAM: VS:  BP 110/76   Pulse 62   Ht 5\' 9"  (1.753 m)   Wt 191 lb 12.8 oz (87 kg)   BMI 28.32 kg/m  , BMI Body mass index is 28.32 kg/m. GEN: Well nourished, well developed, in no acute distress  HEENT: normal  Neck: no JVD, no masses. No carotid bruits Cardiac: RRR without murmur or gallop                Respiratory:  clear to auscultation bilaterally, normal work of breathing GI: soft, nontender, nondistended, + BS MS: no deformity or  atrophy  Ext: no pretibial edema, pedal pulses 2+= bilaterally Skin: warm and dry, no rash Neuro:  Strength and sensation are intact Psych: euthymic mood, full affect  EKG:  EKG is ordered today. The ekg ordered today shows normal sinus rhythm 63 bpm, within normal limits.  Recent Labs: 11/17/2016: ALT 27; BUN 9; Creatinine, Ser 0.90; Hemoglobin 16.0; Platelets 240; Potassium 3.9; Sodium 139   Lipid Panel     Component Value Date/Time   CHOL 105 03/19/2014 1116   TRIG 98.0 03/19/2014 1116   HDL 29.40 (L) 03/19/2014 1116   CHOLHDL 4 03/19/2014 1116   VLDL 19.6 03/19/2014 1116   LDLCALC 56 03/19/2014 1116      Wt Readings from Last 3 Encounters:  03/15/17 191 lb 12.8 oz (87 kg)  11/17/16 195 lb (88.5 kg)  11/13/16 195 lb (88.5 kg)     Cardiac Studies Reviewed: Cardiac catheterization 01/23/2014: PROCEDURAL FINDINGS Hemodynamics: AO 100/67 LV 107/24              Coronary angiography: Coronary dominance: right  Left mainstem: The left mainstem is patent. There is no significant obstructive disease. The left main divides into the LAD and left circumflex.  Left anterior descending (LAD): The proximal LAD has minor irregularities. The first diagonal bifurcates at its origin and essentially supplies a ramus intermedius territory. There is a 30-40% proximal stenosis. Just after the first septal perforator, the LAD is totally occluded with TIMI 0 flow.  Left circumflex (LCx): The left circumflex is small to medium in caliber and covers a small distribution. There are 2 small obtuse marginal branches without significant disease.  Right coronary artery (RCA): Large, dominant vessel. There are diffuse luminal irregularities without significant stenosis. The PDA branch is large with mild 20-30% proximal stenosis. The first and second posterolateral branches are moderate in caliber without significant stenosis. There is collateral filling of the apical LAD.  Left ventriculography:  Left ventricular systolic function is mildly depressed. There is severe hypokinesis of the mid and distal anterior walls as well as the apex. The basal anterior and basal and mid-inferior walls are hyperdynamic. The estimated LVEF is 45%.  PCI Note:  Following the diagnostic procedure, the decision was made to proceed with PCI. The patient had received aspirin 324 mg and heparin 4000 units in the emergency department. He was loaded with effient 60 mg on the table. Weight-based bivalirudin was given for anticoagulation. Once a therapeutic ACT was achieved, a 6 [place] XB LAD guide catheter was inserted.  A cougar coronary guidewire was used to cross the lesion.  The lesion was predilated with a 2.0 x 15 mm balloon.  The lesion was then stented  with a 2.75 x 22 mm resolute drug-eluting stent.  The stent was postdilated with a 3.25 mm noncompliant balloon to 14 atmospheres.  Following PCI, there was 0% residual stenosis and TIMI-3 flow. Final angiography confirmed an excellent result. The patient tolerated the procedure well. He was chest pain-free at the completion of the procedure There were no immediate procedural complications. A TR band was used for radial hemostasis. The patient was transferred to the post catheterization recovery area for further monitoring.  PCI Data: Vessel - LAD/Segment - mid Percent Stenosis (pre)  100  TIMI-flow 0 Stent 2.75x22 mm Resolute DES (post-dil to > 3.25) Percent Stenosis (post) 0 TIMI-flow (post) 3  Final Conclusions:   1. Total occlusion of the LAD after the first septal perforator, treated successfully with primary PCI using a DES platform 2. Minor nonobstructive stenosis of the LM, LCx, and RCA 3. Mild segmental LV systolic dysfunction with moderately elevated LVEDP   Recommendations:  Initiate post-MI med Rx with carvedilol, ASA, Effient, high-dose atorvastatin. Start low-dose ACE in am if BP will allow. Tobacco cessation counseling. Anticipate tx out of  ICU tomorrow if stable and discharge on hospital day #2.  ASSESSMENT AND PLAN: 1.  CAD, native vessel, with angina: The patient has symptoms with typical and atypical features. While his pain feels similar to his previous cardiac pain, it seems to be localized over a specific area in the left chest and unrelated to physical exertion. I recommended that we check an exercise Myoview stress test. He will continue his current medicines which include aspirin, clopidogrel, statin drug, and the beta blocker.  2. Tobacco abuse: Cessation counseling done. Chantix prescription will be written.  3. Hyperlipidemia: The patient is overdue for lipids to be checked. We'll also arrange for a metabolic panel and LFTs. He is on atorvastatin 80 mg.  4. Hypertension: Blood pressure is well controlled on lisinopril and carvedilol.   Current medicines are reviewed with the patient today.  The patient does not have concerns regarding medicines.  Labs/ tests ordered today include:  No orders of the defined types were placed in this encounter.  Disposition:   FU one year  Signed, Tonny Bollman, MD  03/15/2017 3:46 PM    [facility] [address] [place], [place], [place]  [address] Phone: [phone]; Fax: [phone]

## 2017-03-15 NOTE — Patient Instructions (Signed)
Medication Instructions:  Your physician has recommended you make the following change in your medication:  1. START Chantix starting month pack and then transition to continuing month pack  Labwork: Your physician recommends that you return for a FASTING LIPID and CMP--nothing to eat or drink after midnight, lab opens at 7:30 AM  Testing/Procedures: Your physician has requested that you have an exercise stress myoview. For further information please visit [email or web address] Please follow instruction sheet, as given.  Follow-Up: Your physician wants you to follow-up in: 6 MONTHS with Dr Excell Seltzer. You will receive a reminder letter in the mail two months in advance. If you don't receive a letter, please call our office to schedule the follow-up appointment.   Any Other Special Instructions Will Be Listed Below (If Applicable).     If you need a refill on your cardiac medications before your next appointment, please call your pharmacy.

## 2017-03-22 ENCOUNTER — Telehealth (HOSPITAL_COMMUNITY): Payer: Self-pay | Admitting: *Deleted

## 2017-03-22 NOTE — Telephone Encounter (Signed)
Patient given detailed instructions per Myocardial Perfusion Study Information Sheet for the test on 03/24/17 at 0730. Patient notified to arrive 15 minutes early and that it is imperative to arrive on time for appointment to keep from having the test rescheduled.  If you need to cancel or reschedule your appointment, please call the office within 24 hours of your appointment. . Patient verbalized understanding.Belky Mundo, Adelene Idler

## 2017-03-24 ENCOUNTER — Other Ambulatory Visit: Payer: BLUE CROSS/BLUE SHIELD

## 2017-03-24 ENCOUNTER — Encounter (HOSPITAL_COMMUNITY): Payer: BLUE CROSS/BLUE SHIELD

## 2017-03-25 ENCOUNTER — Telehealth (HOSPITAL_COMMUNITY): Payer: Self-pay | Admitting: *Deleted

## 2017-03-25 NOTE — Telephone Encounter (Signed)
Attempted to call patient regarding upcoming appointment - busy x 2.   Kenneth Medina Jacqueline  

## 2017-03-29 ENCOUNTER — Telehealth (HOSPITAL_COMMUNITY): Payer: Self-pay | Admitting: *Deleted

## 2017-03-29 NOTE — Telephone Encounter (Signed)
Patient given detailed instructions per Myocardial Perfusion Study Information Sheet for the test on 03/30/17. Patient notified to arrive 15 minutes early and that it is imperative to arrive on time for appointment to keep from having the test rescheduled.  If you need to cancel or reschedule your appointment, please call the office within 24 hours of your appointment. . Patient verbalized understanding. Kenneth Medina    

## 2017-03-30 ENCOUNTER — Other Ambulatory Visit: Payer: BLUE CROSS/BLUE SHIELD

## 2017-03-30 ENCOUNTER — Ambulatory Visit (HOSPITAL_COMMUNITY): Payer: BLUE CROSS/BLUE SHIELD | Attending: Cardiology

## 2017-03-30 DIAGNOSIS — E782 Mixed hyperlipidemia: Secondary | ICD-10-CM

## 2017-03-30 DIAGNOSIS — I25119 Atherosclerotic heart disease of native coronary artery with unspecified angina pectoris: Secondary | ICD-10-CM

## 2017-03-30 DIAGNOSIS — I10 Essential (primary) hypertension: Secondary | ICD-10-CM | POA: Diagnosis not present

## 2017-03-30 DIAGNOSIS — R079 Chest pain, unspecified: Secondary | ICD-10-CM | POA: Insufficient documentation

## 2017-03-30 DIAGNOSIS — R0609 Other forms of dyspnea: Secondary | ICD-10-CM | POA: Insufficient documentation

## 2017-03-30 LAB — COMPREHENSIVE METABOLIC PANEL
A/G RATIO: 1.3 (ref 1.2–2.2)
ALBUMIN: 3.8 g/dL (ref 3.5–5.5)
ALK PHOS: 124 IU/L — AB (ref 39–117)
ALT: 23 IU/L (ref 0–44)
AST: 27 IU/L (ref 0–40)
BUN / CREAT RATIO: 7 — AB (ref 9–20)
BUN: 6 mg/dL (ref 6–24)
Bilirubin Total: 0.3 mg/dL (ref 0.0–1.2)
CO2: 16 mmol/L — ABNORMAL LOW (ref 20–29)
CREATININE: 0.89 mg/dL (ref 0.76–1.27)
Calcium: 8.6 mg/dL — ABNORMAL LOW (ref 8.7–10.2)
Chloride: 103 mmol/L (ref 96–106)
GFR calc Af Amer: 118 mL/min/{1.73_m2} (ref >59)
GFR calc non Af Amer: 102 mL/min/{1.73_m2} (ref >59)
GLOBULIN, TOTAL: 2.9 g/dL (ref 1.5–4.5)
Glucose: 125 mg/dL — ABNORMAL HIGH (ref 65–99)
POTASSIUM: 4 mmol/L (ref 3.5–5.2)
SODIUM: 141 mmol/L (ref 134–144)
Total Protein: 6.7 g/dL (ref 6.0–8.5)

## 2017-03-30 LAB — LIPID PANEL
CHOL/HDL RATIO: 9.8 ratio — AB (ref 0.0–5.0)
CHOLESTEROL TOTAL: 236 mg/dL — AB (ref 100–199)
HDL: 24 mg/dL — ABNORMAL LOW (ref >39)
LDL CALC: 175 mg/dL — AB (ref 0–99)
Triglycerides: 183 mg/dL — ABNORMAL HIGH (ref 0–149)
VLDL Cholesterol Cal: 37 mg/dL (ref 5–40)

## 2017-03-30 LAB — MYOCARDIAL PERFUSION IMAGING
CHL CUP MPHR: 173 {beats}/min
CHL CUP NUCLEAR SRS: 2
CHL CUP NUCLEAR SSS: 2
CHL CUP RESTING HR STRESS: 59 {beats}/min
CHL RATE OF PERCEIVED EXERTION: 19
CSEPED: 9 min
CSEPEDS: 30 s
CSEPEW: 10.9 METS
CSEPHR: 87 %
LV dias vol: 108 mL (ref 62–150)
LVSYSVOL: 47 mL
Peak HR: 150 {beats}/min
RATE: 0.32
SDS: 0
TID: 0.98

## 2017-03-30 MED ORDER — TECHNETIUM TC 99M TETROFOSMIN IV KIT
32.9000 | PACK | Freq: Once | INTRAVENOUS | Status: AC | PRN
  Administered 2017-03-30: 32.9 via INTRAVENOUS
  Filled 2017-03-30: qty 33

## 2017-03-30 MED ORDER — TECHNETIUM TC 99M TETROFOSMIN IV KIT
10.8000 | PACK | Freq: Once | INTRAVENOUS | Status: AC | PRN
  Administered 2017-03-30: 10.8 via INTRAVENOUS
  Filled 2017-03-30: qty 11

## 2017-04-06 ENCOUNTER — Telehealth: Payer: Self-pay | Admitting: Cardiovascular Disease

## 2017-04-06 DIAGNOSIS — I2510 Atherosclerotic heart disease of native coronary artery without angina pectoris: Secondary | ICD-10-CM

## 2017-04-06 DIAGNOSIS — R400 Somnolence: Secondary | ICD-10-CM

## 2017-04-06 DIAGNOSIS — E785 Hyperlipidemia, unspecified: Secondary | ICD-10-CM

## 2017-04-06 DIAGNOSIS — R0602 Shortness of breath: Secondary | ICD-10-CM

## 2017-04-06 DIAGNOSIS — I255 Ischemic cardiomyopathy: Secondary | ICD-10-CM

## 2017-04-06 NOTE — Telephone Encounter (Signed)
Follow Up:    Pt would like his Stress Test results from 03-30-17 please.

## 2017-04-07 MED ORDER — ATORVASTATIN CALCIUM 80 MG PO TABS: 80.0000 mg | ORAL_TABLET | Freq: Every day | ORAL | 5 refills | Status: DC

## 2017-04-07 NOTE — Telephone Encounter (Signed)
I spoke with the pt and made him aware of myoview and lab results.  I asked the pt if he has been taking Atorvastatin 80mg  daily as prescribed and he looked through all his medications and this bottle is missing.  He feels like he has probably been without this medication for 2 months.  I advised him that I will make Dr Excell Seltzer aware of this information and wait for additional orders in regards to whether the pt should resume atorvastatin or whether Dr Excell Seltzer would like him to change to a different statin drug.  Pt agreed with plan.

## 2017-04-07 NOTE — Telephone Encounter (Signed)
Notes recorded by Tonny Bollman, MD on 04/07/2017 at 2:04 PM EDT Resume atorvastatin 80 mg and repeat labs 3 months. thx ------  I spoke with the pt and made him aware of instructions. Rx sent to the pharmacy and lab appointment scheduled 07/08/17.

## 2017-04-07 NOTE — Telephone Encounter (Signed)
Pharmacy [facility] [address], pt requests 30 day supply

## 2017-04-14 ENCOUNTER — Other Ambulatory Visit: Payer: Self-pay | Admitting: Cardiovascular Disease

## 2017-04-14 DIAGNOSIS — Z720 Tobacco use: Secondary | ICD-10-CM

## 2017-05-14 ENCOUNTER — Other Ambulatory Visit: Payer: Self-pay | Admitting: *Deleted

## 2017-05-14 DIAGNOSIS — I255 Ischemic cardiomyopathy: Secondary | ICD-10-CM

## 2017-05-14 MED ORDER — LISINOPRIL 2.5 MG PO TABS: 2.5000 mg | ORAL_TABLET | Freq: Every day | ORAL | 2 refills | Status: DC

## 2017-07-08 ENCOUNTER — Other Ambulatory Visit: Payer: BLUE CROSS/BLUE SHIELD

## 2017-07-31 ENCOUNTER — Other Ambulatory Visit: Payer: Self-pay | Admitting: Physician Assistant

## 2017-07-31 DIAGNOSIS — I2510 Atherosclerotic heart disease of native coronary artery without angina pectoris: Secondary | ICD-10-CM

## 2017-08-20 ENCOUNTER — Encounter: Payer: Self-pay | Admitting: Cardiovascular Disease

## 2017-09-10 ENCOUNTER — Ambulatory Visit (INDEPENDENT_AMBULATORY_CARE_PROVIDER_SITE_OTHER): Payer: Self-pay | Admitting: Cardiovascular Disease

## 2017-09-10 ENCOUNTER — Encounter: Payer: Self-pay | Admitting: Cardiovascular Disease

## 2017-09-10 DIAGNOSIS — I255 Ischemic cardiomyopathy: Secondary | ICD-10-CM

## 2017-09-10 DIAGNOSIS — R0602 Shortness of breath: Secondary | ICD-10-CM

## 2017-09-10 DIAGNOSIS — I2510 Atherosclerotic heart disease of native coronary artery without angina pectoris: Secondary | ICD-10-CM

## 2017-09-10 DIAGNOSIS — R400 Somnolence: Secondary | ICD-10-CM

## 2017-09-10 MED ORDER — NITROGLYCERIN 0.4 MG SL SUBL: 0.4000 mg | SUBLINGUAL_TABLET | SUBLINGUAL | 2 refills | Status: DC | PRN

## 2017-09-10 MED ORDER — CLOPIDOGREL BISULFATE 75 MG PO TABS: 75.0000 mg | ORAL_TABLET | Freq: Every day | ORAL | 3 refills | Status: DC

## 2017-09-10 MED ORDER — ATORVASTATIN CALCIUM 80 MG PO TABS: 80.0000 mg | ORAL_TABLET | Freq: Every day | ORAL | 3 refills | Status: DC

## 2017-09-10 MED ORDER — CARVEDILOL 6.25 MG PO TABS: 6.2500 mg | ORAL_TABLET | Freq: Two times a day (BID) | ORAL | 3 refills | Status: DC

## 2017-09-10 MED ORDER — LISINOPRIL 2.5 MG PO TABS: 2.5000 mg | ORAL_TABLET | Freq: Every day | ORAL | 3 refills | Status: DC

## 2017-09-10 NOTE — Progress Notes (Signed)
Cardiology Office Note Date:  09/10/2017   ID:  Kenneth Medina, DOB 1971-03-05, MRN [id number]  PCP:  Patient, No Pcp Per  Cardiologist:  Tonny Bollman, MD    Chief Complaint  Patient presents with  . Follow-up    CAD     History of Present Illness: Kenneth Medina is a 46 y.o. male who presents for follow-up of coronary artery disease.  Patient initially presented with an anterior STEMI in 2015 treated with primary PCI of the LAD using a drug-eluting stent.  His post MI LVEF was 50%.  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 Complains of increased fatigue/lack of energy. He does not sleep well. He underwent sleep studies in the past but didn't meet criteria for CPAP. He fell asleep at the movies several months ago and the police showed up because he was still asleep in the theatre after 2:00 am.   He does not have any chest pain or pressure. He complains of shortness of breath with activity and with bending forward. He is able to walk 30-40 minutes without a break. No leg swelling, palpitations, lightheadedness, or syncope.    Past Medical History:  Diagnosis Date  . Coronary artery disease    a. s/p ant STEMI 5/15 >> LHC (5/15):  prox D1 30-40%, LAD occluded, prox PDA 20-30%, mid to dist Ant HK, EF 45% >>> PCI:  2.75x22 mm Resolute DES to mid LAD  . Hyperlipidemia   . Hypertension   . Ischemic cardiomyopathy    a. EF 45% at [facility] at time of MI >> b. Echo (5/15):  Apical HK, no clot, EF 50%, mod LVH, normal RVF  . MI (myocardial infarction) (HCC)    2015  . Tobacco abuse     Past Surgical History:  Procedure Laterality Date  . LEFT HEART CATHETERIZATION WITH CORONARY ANGIOGRAM N/A 01/23/2014   Procedure: LEFT HEART CATHETERIZATION WITH CORONARY ANGIOGRAM;  Surgeon: Micheline Chapman, MD;  Location: [facility] CATH LAB;  Service: Cardiovascular;  Laterality: N/A;    Current Outpatient Medications  Medication Sig Dispense Refill  . aspirin 81 MG chewable tablet Chew 1 tablet (81 mg total) by  mouth daily.    . ondansetron (ZOFRAN ODT) 4 MG disintegrating tablet Take 1 tablet (4 mg total) by mouth every 8 (eight) hours as needed for nausea or vomiting. 15 tablet 0  . atorvastatin (LIPITOR) 80 MG tablet Take 1 tablet (80 mg total) by mouth daily at 6 PM. 90 tablet 3  . carvedilol (COREG) 6.25 MG tablet Take 1 tablet (6.25 mg total) by mouth 2 (two) times daily with a meal. 180 tablet 3  . clopidogrel (PLAVIX) 75 MG tablet Take 1 tablet (75 mg total) by mouth daily. 90 tablet 3  . lisinopril (PRINIVIL,ZESTRIL) 2.5 MG tablet Take 1 tablet (2.5 mg total) by mouth daily. 90 tablet 3  . nitroGLYCERIN (NITROSTAT) 0.4 MG SL tablet Place 1 tablet (0.4 mg total) under the tongue every 5 (five) minutes x 3 doses as needed for chest pain. 25 tablet 2   No current facility-administered medications for this visit.     Allergies:   Ibuprofen   Social History:  The patient  reports that he has been smoking cigarettes.  He has a 15.00 pack-year smoking history. he has never used smokeless tobacco. He reports that he does not drink alcohol or use drugs.   Family History:  The patient's family history includes Cancer in his maternal grandfather and maternal grandmother; Hypertension in his mother.  ROS:  Please see the history of present illness.  Otherwise, review of systems is positive for shortness of breath lying supine, excessive fatigue.  All other systems are reviewed and negative.    PHYSICAL EXAM: VS:  BP 118/76   Pulse 69   Ht 5\' 9"  (1.753 m)   Wt 191 lb 3.2 oz (86.7 kg)   BMI 28.24 kg/m  , BMI Body mass index is 28.24 kg/m. GEN: Well nourished, well developed, in no acute distress  HEENT: normal  Neck: no JVD, no masses. No carotid bruits Cardiac: RRR without murmur or gallop                Respiratory:  clear to auscultation bilaterally, normal work of breathing GI: soft, nontender, nondistended, + BS MS: no deformity or atrophy  Ext: no pretibial edema, pedal pulses 2+=  bilaterally Skin: warm and dry, no rash Neuro:  Strength and sensation are intact Psych: euthymic mood, full affect  EKG:  EKG is ordered today. The ekg ordered today shows NSR, within normal limits  Recent Labs: 11/17/2016: Hemoglobin 16.0; Platelets 240 03/30/2017: ALT 23; BUN 6; Creatinine, Ser 0.89; Potassium 4.0; Sodium 141   Lipid Panel     Component Value Date/Time   CHOL 236 (H) 03/30/2017 0805   TRIG 183 (H) 03/30/2017 0805   HDL 24 (L) 03/30/2017 0805   CHOLHDL 9.8 (H) 03/30/2017 0805   CHOLHDL 4 03/19/2014 1116   VLDL 19.6 03/19/2014 1116   LDLCALC 175 (H) 03/30/2017 0805      Wt Readings from Last 3 Encounters:  09/10/17 191 lb 3.2 oz (86.7 kg)  03/30/17 191 lb (86.6 kg)  03/15/17 191 lb 12.8 oz (87 kg)     Cardiac Studies Reviewed: Myocardial perfusion study March 30, 2017: Study Highlights     Nuclear stress EF: 57%. No wall motion abnormality.  There was no ST segment deviation noted during stress.  The study is normal.  This is a low risk study. No ischemia   ASSESSMENT AND PLAN: 1.  Coronary artery disease, native vessel: The patient appears clinically stable.  He continues on dual antiplatelet therapy with aspirin and clopidogrel.  He is having no anginal symptoms.  His stress test from earlier this year is reviewed and demonstrated no ischemia.  He will follow-up in 1 year.  2.  Tobacco abuse: We discussed that this is the single most important modifiable risk factor with respect to reducing recurrent ischemic events.  He was unable to tolerate Chantix.  He will work to quit cold [place].  3.  Hyperlipidemia: The patient is due for lipids and LFTs.  At the time of his last blood draw he had inadvertently quit taking atorvastatin.  We will follow-up when he can come in fasting for labs.  4.  Hypertension: Blood pressure is well controlled on lisinopril at very low dose.  Current medicines are reviewed with the patient today.  The patient does not  have concerns regarding medicines.  Labs/ tests ordered today include:   Orders Placed This Encounter  Procedures  . Hepatic function panel  . Lipid panel  . EKG 12-Lead    Disposition:   FU one year  Signed, Tonny BollmanMichael Alesana Magistro, MD  09/10/2017 1:41 PM    [facility] [address], [place], [place]  [address] Phone: [phone]; Fax: [phone]

## 2017-09-10 NOTE — Patient Instructions (Signed)
Medication Instructions:  Your provider recommends that you continue on your current medications as directed. Please refer to the Current Medication list given to you today.    Labwork: Your provider recommends that you return for FASTING lab work.    Testing/Procedures: None  Follow-Up: Your provider wants you to follow-up in: 1 year with Dr. Cooper. You will receive a reminder letter in the mail two months in advance. If you don't receive a letter, please call our office to schedule the follow-up appointment.    Any Other Special Instructions Will Be Listed Below (If Applicable).     If you need a refill on your cardiac medications before your next appointment, please call your pharmacy.   

## 2017-09-16 ENCOUNTER — Other Ambulatory Visit: Payer: Self-pay

## 2018-10-27 ENCOUNTER — Other Ambulatory Visit: Payer: Self-pay | Admitting: Cardiovascular Disease

## 2018-10-27 DIAGNOSIS — R400 Somnolence: Secondary | ICD-10-CM

## 2018-10-27 DIAGNOSIS — R0602 Shortness of breath: Secondary | ICD-10-CM

## 2018-10-27 DIAGNOSIS — I255 Ischemic cardiomyopathy: Secondary | ICD-10-CM

## 2019-01-09 ENCOUNTER — Other Ambulatory Visit: Payer: Self-pay | Admitting: Cardiovascular Disease

## 2019-01-09 DIAGNOSIS — R400 Somnolence: Secondary | ICD-10-CM

## 2019-01-09 DIAGNOSIS — R0602 Shortness of breath: Secondary | ICD-10-CM

## 2019-01-09 DIAGNOSIS — I2510 Atherosclerotic heart disease of native coronary artery without angina pectoris: Secondary | ICD-10-CM

## 2019-01-09 DIAGNOSIS — I255 Ischemic cardiomyopathy: Secondary | ICD-10-CM

## 2019-01-10 MED ORDER — LISINOPRIL 2.5 MG PO TABS: 2.5000 mg | ORAL_TABLET | Freq: Every day | ORAL | 0 refills | Status: DC

## 2019-01-10 MED ORDER — CARVEDILOL 6.25 MG PO TABS: 6.2500 mg | ORAL_TABLET | Freq: Two times a day (BID) | ORAL | 0 refills | Status: DC

## 2019-01-10 NOTE — Addendum Note (Signed)
Addended by: Jacqlyn Krauss on: 01/10/2019 08:44 AM   Modules accepted: Orders

## 2019-04-07 ENCOUNTER — Other Ambulatory Visit: Payer: Self-pay | Admitting: Cardiovascular Disease

## 2019-04-07 DIAGNOSIS — I255 Ischemic cardiomyopathy: Secondary | ICD-10-CM

## 2019-09-14 ENCOUNTER — Other Ambulatory Visit: Payer: Self-pay

## 2019-09-14 ENCOUNTER — Ambulatory Visit (INDEPENDENT_AMBULATORY_CARE_PROVIDER_SITE_OTHER): Payer: Self-pay | Admitting: Cardiology

## 2019-09-14 ENCOUNTER — Encounter: Payer: Self-pay | Admitting: Cardiology

## 2019-09-14 VITALS — BP 116/70 | HR 83 | Ht 69.0 in | Wt 180.8 lb

## 2019-09-14 DIAGNOSIS — E785 Hyperlipidemia, unspecified: Secondary | ICD-10-CM

## 2019-09-14 DIAGNOSIS — Z720 Tobacco use: Secondary | ICD-10-CM

## 2019-09-14 DIAGNOSIS — R0609 Other forms of dyspnea: Secondary | ICD-10-CM

## 2019-09-14 DIAGNOSIS — R079 Chest pain, unspecified: Secondary | ICD-10-CM

## 2019-09-14 DIAGNOSIS — I2510 Atherosclerotic heart disease of native coronary artery without angina pectoris: Secondary | ICD-10-CM

## 2019-09-14 DIAGNOSIS — I10 Essential (primary) hypertension: Secondary | ICD-10-CM

## 2019-09-14 DIAGNOSIS — Z79899 Other long term (current) drug therapy: Secondary | ICD-10-CM

## 2019-09-14 DIAGNOSIS — R0600 Dyspnea, unspecified: Secondary | ICD-10-CM

## 2019-09-14 NOTE — Progress Notes (Signed)
Cardiology Office Note:    Date:  09/14/2019   ID:  Kenneth Medina, DOB 10/03/1970, MRN [id number]  PCP:  Patient, No Pcp Per  Cardiologist:  Tonny BollmanMichael Cooper, MD  Referring MD: No ref. provider found   Chief Complaint  Patient presents with  . Follow-up  . Coronary Artery Disease  . Chest Pain  . Shortness of Breath    History of Present Illness:    Kenneth LoweMichael Medina is a 48 y.o. male with a past medical history significant for CAD s/p anterior STEMI 2015 treated with DES to LAD, post MI LVEF was 50%, tobacco abuse and hyperlipidemia.  Echocardiogram in 02/2016 shoed EF 55-60%. Sleep study in the past did not meet criteria for CPAP.  He had a low risk Myoview in 03/2017.  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 No longer doing [employer] due to the pandemic. He lives with his wife. He feels that he is quite active doing his deliveries. He has had a couple of episodes of brief sharp left sided pains. He has also had a couple of feelings in his whole body like when you go over the hill and start going down on a roller coaster. These occurred when sitting at a traffic light and then another time when he was driving. He complains of DOE with carrying groceries or going up 1 flight of stairs. This has been getting progressively more noticeable. After exerts himself like carrying things and goes back to sit in his car is when he feels the sharp chest pains. He has felt the pain 4-5 times in the last couple of months.   The patient was last seen in the office on 09/10/2017 by Dr. Excell Seltzerooper at which time he was not having any chest discomfort but was complaining of shortness of breath with activity and with bending forward.  He was able to walk for 30-40 minutes.  He had no edema.  He continues to smoke but is down to shorter cigarettes, and throws it out before he is finished. He says it equals out to about 1/2 PPD. He has cut down on salt intake, caffeine and fast foods. He used to drink 12 Mt. Dews  per day and 3 to 4 Monsters per day. He is now doing better with only an occasional rootbeer. His wife is making sure he is taking all of his medications.   He has no PCP. Strongly advised to initiate primary care.   Cardiac studies   Myocardial perfusion study March 30, 2017: Study Highlights    Nuclear stress EF: 57%. No wall motion abnormality.  There was no ST segment deviation noted during stress.  The study is normal.  This is a low risk study. No ischemia   Echocardiogram 03/12/2016 Study Conclusions - Left ventricle: The cavity size was normal. Wall thickness was   normal. Systolic function was normal. The estimated ejection   fraction was in the range of 55% to 60%. Wall motion was normal;   there were no regional wall motion abnormalities. Left   ventricular diastolic function parameters were normal. - Aortic valve: There was no stenosis. - Mitral valve: There was no regurgitation. - Right ventricle: The cavity size was normal. Systolic function   was normal. - Pulmonary arteries: No complete TR doppler jet so unable to   estimate PA systolic pressure. - Systemic veins: IVC measured 1.7 cm with < 50% respirophasic   variation, suggesting RA pressure 8 mmHg.  Impressions: - Normal LV size  and systolic function, EF 55-60%. Normal diastolic   function. Normal RV size and systolic function. No significant   valvular abnormalities.   Past Medical History:  Diagnosis Date  . Coronary artery disease    a. s/p ant STEMI 5/15 >> LHC (5/15):  prox D1 30-40%, LAD occluded, prox PDA 20-30%, mid to dist Ant HK, EF 45% >>> PCI:  2.75x22 mm Resolute DES to mid LAD  . Hyperlipidemia   . Hypertension   . Ischemic cardiomyopathy    a. EF 45% at [facility] at time of MI >> b. Echo (5/15):  Apical HK, no clot, EF 50%, mod LVH, normal RVF  . MI (myocardial infarction) (HCC)    2015  . Tobacco abuse     Past Surgical History:  Procedure Laterality Date  . LEFT HEART  CATHETERIZATION WITH CORONARY ANGIOGRAM N/A 01/23/2014   Procedure: LEFT HEART CATHETERIZATION WITH CORONARY ANGIOGRAM;  Surgeon: Micheline Chapman, MD;  Location: [facility] CATH LAB;  Service: Cardiovascular;  Laterality: N/A;    Current Medications: Current Meds  Medication Sig  . aspirin 81 MG chewable tablet Chew 1 tablet (81 mg total) by mouth daily.  Marland Kitchen atorvastatin (LIPITOR) 80 MG tablet TAKE 1 TABLET (80 MG TOTAL) BY MOUTH DAILY AT 6 PM.  . carvedilol (COREG) 6.25 MG tablet Take 1 tablet (6.25 mg total) by mouth 2 (two) times daily with a meal.  . clopidogrel (PLAVIX) 75 MG tablet Take 75 mg by mouth daily.  Marland Kitchen lisinopril (ZESTRIL) 2.5 MG tablet Take 1 tablet (2.5 mg total) by mouth daily. Pt will need to schedule an appt for further refills - 2nd attempt  . nitroGLYCERIN (NITROSTAT) 0.4 MG SL tablet Place 1 tablet (0.4 mg total) under the tongue every 5 (five) minutes x 3 doses as needed for chest pain.  Marland Kitchen ondansetron (ZOFRAN ODT) 4 MG disintegrating tablet Take 1 tablet (4 mg total) by mouth every 8 (eight) hours as needed for nausea or vomiting.     Allergies:   Ibuprofen   Social History   Socioeconomic History  . Marital status: Married    Spouse name: Not on file  . Number of children: Not on file  . Years of education: Not on file  . Highest education level: Not on file  Occupational History  . Not on file  Tobacco Use  . Smoking status: Current Every Day Smoker    Packs/day: 0.50    Years: 30.00    Pack years: 15.00    Types: Cigarettes  . Smokeless tobacco: Never Used  Substance and Sexual Activity  . Alcohol use: No  . Drug use: No  . Sexual activity: Not on file  Other Topics Concern  . Not on file  Social History Narrative   The patient is married. He works as a Surveyor, minerals for [employer]. He does not drink alcohol or use illicit drugs. He does smoke cigarettes one half pack per day.   Social Determinants of Health   Financial Resource Strain:   .  Difficulty of Paying Living Expenses: Not on file  Food Insecurity:   . Worried About Programme researcher, broadcasting/film/video in the Last Year: Not on file  . Ran Out of Food in the Last Year: Not on file  Transportation Needs:   . Lack of Transportation (Medical): Not on file  . Lack of Transportation (Non-Medical): Not on file  Physical Activity:   . Days of Exercise per Week: Not on file  . Minutes of  Exercise per Session: Not on file  Stress:   . Feeling of Stress : Not on file  Social Connections:   . Frequency of Communication with Friends and Family: Not on file  . Frequency of Social Gatherings with Friends and Family: Not on file  . Attends Religious Services: Not on file  . Active Member of Clubs or Organizations: Not on file  . Attends Banker Meetings: Not on file  . Marital Status: Not on file     Family History: The patient's family history includes Cancer in his maternal grandfather and maternal grandmother; Hypertension in his mother. There is no history of Heart attack or Stroke. ROS:   Please see the history of present illness.     All other systems reviewed and are negative.   EKG:  EKG is ordered today.  The ekg ordered today demonstrates NSR with no significant ST/T changes, 83 bpm.  Recent Labs: 09/14/2019: ALT 27; BUN 12; Creatinine, Ser 0.96; Hemoglobin 16.5; Platelets 265; Potassium 4.1; Sodium 138   Recent Lipid Panel    Component Value Date/Time   CHOL 169 09/14/2019 1514   TRIG 162 (H) 09/14/2019 1514   HDL 27 (L) 09/14/2019 1514   CHOLHDL 6.3 (H) 09/14/2019 1514   CHOLHDL 4 03/19/2014 1116   VLDL 19.6 03/19/2014 1116   LDLCALC 113 (H) 09/14/2019 1514    Physical Exam:    VS:  BP 116/70   Pulse 83   Ht 5\' 9"  (1.753 m)   Wt 180 lb 12.8 oz (82 kg)   SpO2 94%   BMI 26.70 kg/m     Wt Readings from Last 6 Encounters:  09/14/19 180 lb 12.8 oz (82 kg)  09/10/17 191 lb 3.2 oz (86.7 kg)  03/30/17 191 lb (86.6 kg)  03/15/17 191 lb 12.8 oz (87 kg)    11/17/16 195 lb (88.5 kg)  11/13/16 195 lb (88.5 kg)     Physical Exam  Constitutional: He is oriented to person, place, and time. He appears well-developed and well-nourished. No distress.  HENT:  Head: Normocephalic and atraumatic.  Cardiovascular: Normal rate, regular rhythm, normal heart sounds and intact distal pulses. Exam reveals no gallop and no friction rub.  No murmur heard. Pulmonary/Chest: Effort normal and breath sounds normal. No respiratory distress. He has no wheezes. He has no rales.  Abdominal: Soft. Bowel sounds are normal. He exhibits no distension.  Musculoskeletal:        General: No edema. Normal range of motion.     Cervical back: Normal range of motion and neck supple.  Neurological: He is alert and oriented to person, place, and time.  Skin: Skin is warm and dry.  Psychiatric: He has a normal mood and affect. His behavior is normal. Judgment and thought content normal.  Vitals reviewed.    ASSESSMENT:    1. Chest pain, unspecified type   2. Dyspnea on exertion   3. Coronary artery disease involving native coronary artery of native heart without angina pectoris   4. Hyperlipidemia, unspecified hyperlipidemia type   5. Essential (primary) hypertension   6. Tobacco use   7. Medication management    PLAN:    In order of problems listed above:  Chest pain/DOE -His chest pain is mostly atypical and not occurring often, may be musculoskeletal. However, he does note some DOE when going up stairs and carrying things. With his hx of CAD and no recent studies, I will check a lexiscan myoview.   CAD -History  of anterior STEMI in 2015 treated with DES to LAD.  Post MI LVEF was 50%. -Patient continues on aspirin 81 mg, Plavix, high intensity statin and beta-blocker. -Since he has no PCP and no recent labs I will check a CBC since on Plavix.   Hyperlipidemia, Goal LDL<70 -On atorvastatin 80 mg daily -Last lipid panel 03/2017 showed LDL of 175.  It is not  clear whether he was taking his atorvastatin at that time. -He says that he has been taking his atorvastatin regularly now. I will check a lipid panel.   Hypertension -On carvedilol 6.25 mg twice daily, lisinopril 2.5 mg daily  Tobacco abuse -Continues to smoke but is trying to cut down using short cigarettes and putting them out before finished. We discussed the negative effects of smoking on vascular health with increased risk for heart attack and stroke. He will continue to work on it.  -May be contributing to his mild DOE.   Medication Adjustments/Labs and Tests Ordered: Current medicines are reviewed at length with the patient today.  Concerns regarding medicines are outlined above. Labs and tests ordered and medication changes are outlined in the patient instructions below:  Patient Instructions  Medication Instructions:  Your physician recommends that you continue on your current medications as directed. Please refer to the Current Medication list given to you today.  *If you need a refill on your cardiac medications before your next appointment, please call your pharmacy*  Lab Work: Lipids, [place]   If you have labs (blood work) drawn today and your tests are completely normal, you will receive your results only by: Marland Kitchen MyChart Message (if you have MyChart) OR . A paper copy in the mail If you have any lab test that is abnormal or we need to change your treatment, we will call you to review the results.  Testing/Procedures: Your physician has requested that you have a lexiscan myoview. For further information please visit [email or web address]. Please follow instruction sheet, as given.   Follow-Up: At [facility], you and your health needs are our priority.  As part of our continuing mission to provide you with exceptional heart care, we have created designated Provider Care Teams.  These Care Teams include your primary Cardiologist (physician) and Advanced Practice  Providers (APPs -  Physician Assistants and Nurse Practitioners) who all work together to provide you with the care you need, when you need it.  Your next appointment:   6 month(s)  The format for your next appointment:   In Person  Provider:   You may see Sherren Mocha, MD or one of the following Advanced Practice Providers on your designated Care Team:    Richardson Dopp, PA-C  [place], [place]  Daune Perch, NP   Other Instructions  Lifestyle Modifications to Prevent and Treat Heart Disease -Recommend heart healthy/Mediterranean diet, with whole grains, fruits, vegetables, fish, lean meats, nuts, olive oil and avocado oil.  -Limit salt intake to less than 2000 mg per day.  -Recommend moderate walking, starting slowly with a few minutes and working up to 3-5 times/week for 30-50 minutes each session. Aim for at least 150 minutes.week. Goal should be pace of 3 miles/hours, or walking 1.5 miles in 30 minutes -Recommend avoidance of tobacco products. Avoid excess alcohol. -Keep blood pressure well controlled, ideally less than 130/80.      Signed, Daune Perch, NP  09/15/2019 7:41 AM    [place]

## 2019-09-14 NOTE — Patient Instructions (Addendum)
Medication Instructions:  Your physician recommends that you continue on your current medications as directed. Please refer to the Current Medication list given to you today.  *If you need a refill on your cardiac medications before your next appointment, please call your pharmacy*  Lab Work: Lipids, [place]   If you have labs (blood work) drawn today and your tests are completely normal, you will receive your results only by: Marland Kitchen MyChart Message (if you have MyChart) OR . A paper copy in the mail If you have any lab test that is abnormal or we need to change your treatment, we will call you to review the results.  Testing/Procedures: Your physician has requested that you have a lexiscan myoview. For further information please visit [email or web address]. Please follow instruction sheet, as given.   Follow-Up: At [facility], you and your health needs are our priority.  As part of our continuing mission to provide you with exceptional heart care, we have created designated Provider Care Teams.  These Care Teams include your primary Cardiologist (physician) and Advanced Practice Providers (APPs -  Physician Assistants and Nurse Practitioners) who all work together to provide you with the care you need, when you need it.  Your next appointment:   6 month(s)  The format for your next appointment:   In Person  Provider:   You may see Sherren Mocha, MD or one of the following Advanced Practice Providers on your designated Care Team:    Richardson Dopp, PA-C  [place], [place]  Daune Perch, NP   Other Instructions  Lifestyle Modifications to Prevent and Treat Heart Disease -Recommend heart healthy/Mediterranean diet, with whole grains, fruits, vegetables, fish, lean meats, nuts, olive oil and avocado oil.  -Limit salt intake to less than 2000 mg per day.  -Recommend moderate walking, starting slowly with a few minutes and working up to 3-5 times/week for 30-50 minutes each  session. Aim for at least 150 minutes.week. Goal should be pace of 3 miles/hours, or walking 1.5 miles in 30 minutes -Recommend avoidance of tobacco products. Avoid excess alcohol. -Keep blood pressure well controlled, ideally less than 130/80.

## 2019-09-15 ENCOUNTER — Other Ambulatory Visit: Payer: Self-pay | Admitting: Cardiology

## 2019-09-15 DIAGNOSIS — R0602 Shortness of breath: Secondary | ICD-10-CM

## 2019-09-15 DIAGNOSIS — I255 Ischemic cardiomyopathy: Secondary | ICD-10-CM

## 2019-09-15 DIAGNOSIS — R400 Somnolence: Secondary | ICD-10-CM

## 2019-09-15 LAB — COMPREHENSIVE METABOLIC PANEL
ALT: 27 IU/L (ref 0–44)
AST: 25 IU/L (ref 0–40)
Albumin/Globulin Ratio: 1.6 (ref 1.2–2.2)
Albumin: 4.4 g/dL (ref 4.0–5.0)
Alkaline Phosphatase: 153 IU/L — ABNORMAL HIGH (ref 39–117)
BUN/Creatinine Ratio: 13 (ref 9–20)
BUN: 12 mg/dL (ref 6–24)
Bilirubin Total: <0.2 mg/dL (ref 0.0–1.2)
CO2: 22 mmol/L (ref 20–29)
Calcium: 9.3 mg/dL (ref 8.7–10.2)
Chloride: 103 mmol/L (ref 96–106)
Creatinine, Ser: 0.96 mg/dL (ref 0.76–1.27)
GFR calc Af Amer: 106 mL/min/{1.73_m2} (ref >59)
GFR calc non Af Amer: 92 mL/min/{1.73_m2} (ref >59)
Globulin, Total: 2.7 g/dL (ref 1.5–4.5)
Glucose: 99 mg/dL (ref 65–99)
Potassium: 4.1 mmol/L (ref 3.5–5.2)
Sodium: 138 mmol/L (ref 134–144)
Total Protein: 7.1 g/dL (ref 6.0–8.5)

## 2019-09-15 LAB — CBC
Hematocrit: 46.5 % (ref 37.5–51.0)
Hemoglobin: 16.5 g/dL (ref 13.0–17.7)
MCH: 31.3 pg (ref 26.6–33.0)
MCHC: 35.5 g/dL (ref 31.5–35.7)
MCV: 88 fL (ref 79–97)
Platelets: 265 10*3/uL (ref 150–450)
RBC: 5.28 x10E6/uL (ref 4.14–5.80)
RDW: 12.5 % (ref 11.6–15.4)
WBC: 13.1 10*3/uL — ABNORMAL HIGH (ref 3.4–10.8)

## 2019-09-15 LAB — LIPID PANEL
Chol/HDL Ratio: 6.3 ratio — ABNORMAL HIGH (ref 0.0–5.0)
Cholesterol, Total: 169 mg/dL (ref 100–199)
HDL: 27 mg/dL — ABNORMAL LOW (ref >39)
LDL Chol Calc (NIH): 113 mg/dL — ABNORMAL HIGH (ref 0–99)
Triglycerides: 162 mg/dL — ABNORMAL HIGH (ref 0–149)
VLDL Cholesterol Cal: 29 mg/dL (ref 5–40)

## 2019-09-15 MED ORDER — CARVEDILOL 6.25 MG PO TABS: 6.2500 mg | ORAL_TABLET | Freq: Two times a day (BID) | ORAL | 3 refills | Status: DC

## 2019-09-15 MED ORDER — NITROGLYCERIN 0.4 MG SL SUBL: 0.4000 mg | SUBLINGUAL_TABLET | SUBLINGUAL | 2 refills | Status: DC | PRN

## 2019-09-15 MED ORDER — EZETIMIBE 10 MG PO TABS: 10.0000 mg | ORAL_TABLET | Freq: Every day | ORAL | 3 refills | Status: DC

## 2019-09-15 MED ORDER — LISINOPRIL 2.5 MG PO TABS: 2.5000 mg | ORAL_TABLET | Freq: Every day | ORAL | 3 refills | Status: DC

## 2019-09-15 MED ORDER — ATORVASTATIN CALCIUM 80 MG PO TABS: 80.0000 mg | ORAL_TABLET | Freq: Every day | ORAL | 3 refills | Status: DC

## 2019-09-15 MED ORDER — CLOPIDOGREL BISULFATE 75 MG PO TABS: 75.0000 mg | ORAL_TABLET | Freq: Every day | ORAL | 3 refills | Status: AC

## 2019-09-18 ENCOUNTER — Telehealth: Payer: Self-pay

## 2019-09-18 ENCOUNTER — Telehealth (HOSPITAL_COMMUNITY): Payer: Self-pay

## 2019-09-18 DIAGNOSIS — E785 Hyperlipidemia, unspecified: Secondary | ICD-10-CM

## 2019-09-18 NOTE — Telephone Encounter (Signed)
Spoke with the patient, instructions given. He stated that he will be here for his test. Asked to call back with any questions. Kenneth Medina EMTP 

## 2019-09-18 NOTE — Telephone Encounter (Signed)
-----  Message from Daune Perch, NP sent at 09/15/2019 12:18 PM EST ----- Cholesterol levels indicate that LDL (bad cholesterol) is improved on atorvastatin 80 mg but not yet to goal of less than 70. I spoke to the patient and we are going to add zetia 10 mg daily. I sent in Rx to [facility] in [place] and also sent in reorders for all of his other meds to this pharmacy as he requests.  His other labs were normal except slightly elevated Alk Phos. This was mildly elevated 2 yrs ago. He denies any abdominal pain. I advised pt again to establish with a PCP to follow this. His WBC's are elevated but he denies any S/S of infection. His WBCs have been mildly elevated to this degree in the past.  Plan to follow up lipids and LFTs in about 6 weeks. If LDL does not get to goal, will refer to lipid clinic.   Daune Perch, NP

## 2019-09-21 ENCOUNTER — Ambulatory Visit (HOSPITAL_COMMUNITY): Payer: Self-pay | Attending: Cardiology

## 2019-09-21 ENCOUNTER — Other Ambulatory Visit: Payer: Self-pay

## 2019-09-21 DIAGNOSIS — R0609 Other forms of dyspnea: Secondary | ICD-10-CM

## 2019-09-21 DIAGNOSIS — R0600 Dyspnea, unspecified: Secondary | ICD-10-CM | POA: Insufficient documentation

## 2019-09-21 LAB — MYOCARDIAL PERFUSION IMAGING
LV dias vol: 105 mL (ref 62–150)
LV sys vol: 46 mL
Peak HR: 90 {beats}/min
Rest HR: 57 {beats}/min
SDS: 1
SRS: 0
SSS: 1
TID: 1.05

## 2019-09-21 MED ORDER — REGADENOSON 0.4 MG/5ML IV SOLN
0.4000 mg | Freq: Once | INTRAVENOUS | Status: AC
  Administered 2019-09-21: 10:00:00 0.4 mg via INTRAVENOUS

## 2019-09-21 MED ORDER — TECHNETIUM TC 99M TETROFOSMIN IV KIT
32.4000 | PACK | Freq: Once | INTRAVENOUS | Status: AC | PRN
  Administered 2019-09-21: 32.4 via INTRAVENOUS
  Filled 2019-09-21: qty 33

## 2019-09-21 MED ORDER — TECHNETIUM TC 99M TETROFOSMIN IV KIT
10.2000 | PACK | Freq: Once | INTRAVENOUS | Status: AC | PRN
  Administered 2019-09-21: 10.2 via INTRAVENOUS
  Filled 2019-09-21: qty 11

## 2019-10-31 ENCOUNTER — Other Ambulatory Visit: Payer: Self-pay

## 2020-09-21 ENCOUNTER — Other Ambulatory Visit: Payer: Self-pay

## 2020-09-21 DIAGNOSIS — Z20822 Contact with and (suspected) exposure to covid-19: Secondary | ICD-10-CM

## 2020-09-24 LAB — NOVEL CORONAVIRUS, NAA: SARS-CoV-2, NAA: DETECTED — AB

## 2022-01-05 ENCOUNTER — Other Ambulatory Visit: Payer: Self-pay

## 2022-01-05 DIAGNOSIS — R400 Somnolence: Secondary | ICD-10-CM

## 2022-01-05 DIAGNOSIS — R0602 Shortness of breath: Secondary | ICD-10-CM

## 2022-01-05 DIAGNOSIS — I255 Ischemic cardiomyopathy: Secondary | ICD-10-CM

## 2022-01-05 MED ORDER — EZETIMIBE 10 MG PO TABS: 10.0000 mg | ORAL_TABLET | Freq: Every day | ORAL | 0 refills | Status: DC

## 2022-01-05 MED ORDER — NITROGLYCERIN 0.4 MG SL SUBL: 0.4000 mg | SUBLINGUAL_TABLET | SUBLINGUAL | 0 refills | Status: DC | PRN

## 2022-01-05 MED ORDER — CARVEDILOL 6.25 MG PO TABS: 6.2500 mg | ORAL_TABLET | Freq: Two times a day (BID) | ORAL | 0 refills | Status: DC

## 2022-01-05 MED ORDER — LISINOPRIL 2.5 MG PO TABS: 2.5000 mg | ORAL_TABLET | Freq: Every day | ORAL | 0 refills | Status: DC

## 2022-01-05 MED ORDER — ATORVASTATIN CALCIUM 80 MG PO TABS: 80.0000 mg | ORAL_TABLET | Freq: Every day | ORAL | 0 refills | Status: DC

## 2022-01-21 NOTE — Progress Notes (Signed)
?Cardiology Office Note:   ? ?Date:  01/23/2022  ? ?ID:  Kenneth Medina, DOB [birth year], MRN [id number] ? ?PCP:  Patient, No Pcp Per (Inactive) ?  ?[facility] Providers ?Cardiologist:  Sherren Mocha, MD    ? ?Referring MD: No ref. provider found  ? ?Chief Complaint: chest tightness, racing heart, and dizziness ? ?History of Present Illness:   ? ?Kenneth Medina is a very pleasant 51 y.o. male with a hx of hypertension, CAD, ICM, mild sleep apnea  ? ?He initially presented with an anterior STEMI in 2015 treated with primary PCI of the LAD using a DES.  Post MI LVEF was 50%.  He underwent sleep testing for fatigue however he did not meet criteria for CPAP ? ?He was last seen in our office by Pecolia Ades, NP on 09/14/2019.  He underwent Lexiscan Myoview 09/21/2019 which revealed no ischemia identified, no ST segment deviation, normal EF 57%, no wall motion abnormalities, low risk study. He was advised to follow-up in 6 months.  ? ?Today, he is here alone for follow-up of CAD. Reports feeling more fatigued over last few months. 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 His office is upstairs so he is up and down and walking the property throughout the day. No formal exercise program. Noticing more episodes of rapid heart beat, "spacing out," giving out of breath when walking up an incline, feeling skipped heart beats and dizziness. Shortness of breath occurs frequently when going up stairs to his office at work. Dizziness occurs at random times. Feels like he is "spacing out." Wife has noticed this when she is talking to him. Chest tightness occurs randomly, at rest and with exertion and does not feel like angina prior to MI. Chest tightness is not associated with nausea or diaphoresis. Feels his heart beating fast most often when he is resting. Has taken notes on his phone about his symptoms and states more symptoms occur after lunch and in the evening. Does not check BP at home. He denies presyncope,  syncope, edema, orthopnea, PND, and melena. Has significantly reduced caffeine and sodium intake since his MI in 2015. Continues to smoke 1 ppd but now smokes slims rather than regular cigarettes.  ? ?Past Medical History:  ?Diagnosis Date  ? Coronary artery disease   ? a. s/p ant STEMI 5/15 >> LHC (5/15):  prox D1 30-40%, LAD occluded, prox PDA 20-30%, mid to dist Ant HK, EF 45% >>> PCI:  2.75x22 mm Resolute DES to mid LAD  ? Hyperlipidemia   ? Hypertension   ? Ischemic cardiomyopathy   ? a. EF 45% at [facility] at time of MI >> b. Echo (5/15):  Apical HK, no clot, EF 50%, mod LVH, normal RVF  ? MI (myocardial infarction) ([place])   ? 2015  ? Tobacco abuse   ? ? ?Past Surgical History:  ?Procedure Laterality Date  ? LEFT HEART CATHETERIZATION WITH CORONARY ANGIOGRAM N/A 01/23/2014  ? Procedure: LEFT HEART CATHETERIZATION WITH CORONARY ANGIOGRAM;  Surgeon: Blane Ohara, MD;  Location: [facility] CATH LAB;  Service: Cardiovascular;  Laterality: N/A;  ? ? ?Current Medications: ?Current Meds  ?Medication Sig  ? aspirin 81 MG chewable tablet Chew 1 tablet (81 mg total) by mouth daily.  ? nicotine (NICODERM CQ - DOSED IN MG/24 HOURS) 14 mg/24hr patch Place 1 patch (14 mg total) onto the skin daily. FOR 30 DAYS  ? nicotine (NICODERM CQ - DOSED IN MG/24 HOURS) 21 mg/24hr patch Place 1 patch (21  mg total) onto the skin daily. FOR 30 DAYS  ? nicotine (NICODERM CQ) 7 mg/24hr patch Place 1 patch (7 mg total) onto the skin daily. FOR 30 DAYS  ? ondansetron (ZOFRAN ODT) 4 MG disintegrating tablet Take 1 tablet (4 mg total) by mouth every 8 (eight) hours as needed for nausea or vomiting.  ? [DISCONTINUED] atorvastatin (LIPITOR) 80 MG tablet Take 1 tablet (80 mg total) by mouth daily at 6 PM. Please schedule appt for future refills. 1st attempt.  ? [DISCONTINUED] carvedilol (COREG) 6.25 MG tablet Take 1 tablet (6.25 mg total) by mouth 2 (two) times daily with a meal. Please schedule appt for future refills. 1st attempt.  ? [DISCONTINUED]  ezetimibe (ZETIA) 10 MG tablet Take 1 tablet (10 mg total) by mouth daily. Please schedule appt for future refills. 1st attempt.  ? [DISCONTINUED] lisinopril (ZESTRIL) 2.5 MG tablet Take 1 tablet (2.5 mg total) by mouth daily. Please schedule appt for future refills. 1st attempt.  ? [DISCONTINUED] nitroGLYCERIN (NITROSTAT) 0.4 MG SL tablet Place 1 tablet (0.4 mg total) under the tongue every 5 (five) minutes x 3 doses as needed for chest pain. Please schedule appt for future refills. 1st attempt.  ?  ? ?Allergies:   Ibuprofen  ? ?Social History  ? ?Socioeconomic History  ? Marital status: Married  ?  Spouse name: Not on file  ? Number of children: Not on file  ? Years of education: Not on file  ? Highest education level: Not on file  ?Occupational History  ? Not on file  ?Tobacco Use  ? Smoking status: Every Day  ?  Packs/day: 0.50  ?  Years: 30.00  ?  Pack years: 15.00  ?  Types: Cigarettes  ? Smokeless tobacco: Never  ?Vaping Use  ? Vaping Use: Never used  ?Substance and Sexual Activity  ? Alcohol use: No  ? Drug use: No  ? Sexual activity: Not on file  ?Other Topics Concern  ? Not on file  ?Social History Narrative  ? The patient is married. 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 He does not drink alcohol or use illicit drugs. He does smoke cigarettes one half pack per day.  ? ?Social Determinants of Health  ? ?Financial Resource Strain: Not on file  ?Food Insecurity: Not on file  ?Transportation Needs: Not on file  ?Physical Activity: Not on file  ?Stress: Not on file  ?Social Connections: Not on file  ?  ? ?Family History: ?The patient's family history includes Cancer in his maternal grandfather and maternal grandmother; Hypertension in his mother. There is no history of Heart attack or Stroke. ? ?ROS:   ?Please see the history of present illness.    ?+ occasional chest tightness ?+ fatigue ?+ shortness of breath ?+ palpitations ?All other systems reviewed and are negative. ? ?Labs/Other Studies  Reviewed:   ? ?The following studies were reviewed today: ? ?Lexiscan Myoview 09/21/19 ? ?The left ventricular ejection fraction is normal (55-65%). ?Nuclear stress EF: 57%. No wall motion abnormalities ?There was no ST segment deviation noted during stress. ?The study is normal. ?This is a low risk study. No ischemia identified ? ?Exercise Myoview 7//17/18 ? ?Nuclear stress EF: 57%. No wall motion abnormality. ?There was no ST segment deviation noted during stress. ?The study is normal. ?This is a low risk study. No ischemia ? ? ?Echo 03/12/16 ? ?- Left ventricle: The cavity size was normal. Wall thickness was  ?  normal. Systolic function was  normal. The estimated ejection  ?  fraction was in the range of 55% to 60%. Wall motion was normal;  ?  there were no regional wall motion abnormalities. Left  ?  ventricular diastolic function parameters were normal.  ?- Aortic valve: There was no stenosis.  ?- Mitral valve: There was no regurgitation.  ?- Right ventricle: The cavity size was normal. Systolic function  ?  was normal.  ?- Pulmonary arteries: No complete TR doppler jet so unable to  ?  estimate PA systolic pressure.  ?- Systemic veins: IVC measured 1.7 cm with < 50% respirophasic  ?  variation, suggesting RA pressure 8 mmHg.  ? ?Impressions:  ? ?- Normal LV size and systolic function, EF 0000000. Normal diastolic  ?  function. Normal RV size and systolic function. No significant  ?  valvular abnormalities.  ? ?Recent Labs: ?No results found for requested labs within last 8760 hours.  ?Recent Lipid Panel ?   ?Component Value Date/Time  ? CHOL 169 09/14/2019 1514  ? TRIG 162 (H) 09/14/2019 1514  ? HDL 27 (L) 09/14/2019 1514  ? CHOLHDL 6.3 (H) 09/14/2019 1514  ? CHOLHDL 4 03/19/2014 1116  ? VLDL 19.6 03/19/2014 1116  ? [place] 113 (H) 09/14/2019 1514  ? ? ? ?Risk Assessment/Calculations:   ?  ?  ? ?Physical Exam:   ? ?VS:  BP 100/60 (BP Location: Left Arm, Patient Position: Sitting, Cuff Size: Normal)   Ht 5\' 9"   (1.753 m)   Wt 180 lb (81.6 kg)   BMI 26.58 kg/m?    ? ?Wt Readings from Last 3 Encounters:  ?01/23/22 180 lb (81.6 kg)  ?09/21/19 180 lb (81.6 kg)  ?09/14/19 180 lb 12.8 oz (82 kg)  ?  ? ?GEN:  Well nourished, well develo

## 2022-01-23 ENCOUNTER — Encounter: Payer: Self-pay | Admitting: Nurse Practitioner

## 2022-01-23 ENCOUNTER — Ambulatory Visit (INDEPENDENT_AMBULATORY_CARE_PROVIDER_SITE_OTHER): Payer: Self-pay

## 2022-01-23 ENCOUNTER — Ambulatory Visit (INDEPENDENT_AMBULATORY_CARE_PROVIDER_SITE_OTHER): Payer: Self-pay | Admitting: Nurse Practitioner

## 2022-01-23 VITALS — BP 100/60 | Ht 69.0 in | Wt 180.0 lb

## 2022-01-23 DIAGNOSIS — R0600 Dyspnea, unspecified: Secondary | ICD-10-CM

## 2022-01-23 DIAGNOSIS — R002 Palpitations: Secondary | ICD-10-CM

## 2022-01-23 DIAGNOSIS — I255 Ischemic cardiomyopathy: Secondary | ICD-10-CM

## 2022-01-23 DIAGNOSIS — I2510 Atherosclerotic heart disease of native coronary artery without angina pectoris: Secondary | ICD-10-CM

## 2022-01-23 DIAGNOSIS — E785 Hyperlipidemia, unspecified: Secondary | ICD-10-CM

## 2022-01-23 DIAGNOSIS — I10 Essential (primary) hypertension: Secondary | ICD-10-CM

## 2022-01-23 MED ORDER — NICOTINE 14 MG/24HR TD PT24: 14.0000 mg | MEDICATED_PATCH | Freq: Every day | TRANSDERMAL | 0 refills | Status: DC

## 2022-01-23 MED ORDER — EZETIMIBE 10 MG PO TABS: 10.0000 mg | ORAL_TABLET | Freq: Every day | ORAL | 3 refills | Status: DC

## 2022-01-23 MED ORDER — NICOTINE 21 MG/24HR TD PT24: 21.0000 mg | MEDICATED_PATCH | Freq: Every day | TRANSDERMAL | 0 refills | Status: DC

## 2022-01-23 MED ORDER — NITROGLYCERIN 0.4 MG SL SUBL: 0.4000 mg | SUBLINGUAL_TABLET | SUBLINGUAL | 5 refills | Status: DC | PRN

## 2022-01-23 MED ORDER — ATORVASTATIN CALCIUM 80 MG PO TABS: 80.0000 mg | ORAL_TABLET | Freq: Every day | ORAL | 3 refills | Status: DC

## 2022-01-23 MED ORDER — LISINOPRIL 2.5 MG PO TABS: 2.5000 mg | ORAL_TABLET | Freq: Every day | ORAL | 3 refills | Status: DC

## 2022-01-23 MED ORDER — CARVEDILOL 6.25 MG PO TABS: 6.2500 mg | ORAL_TABLET | Freq: Two times a day (BID) | ORAL | 3 refills | Status: DC

## 2022-01-23 MED ORDER — NICOTINE 7 MG/24HR TD PT24: 7.0000 mg | MEDICATED_PATCH | Freq: Every day | TRANSDERMAL | 0 refills | Status: DC

## 2022-01-23 NOTE — Progress Notes (Unsigned)
Enrolled patient for a 7 day Zio XT monitor to be mailed to patients home  ? ?Dr Burt Knack to read ?

## 2022-01-23 NOTE — Patient Instructions (Signed)
Medication Instructions:  ?Your physician has recommended you make the following change in your medication:  ?START NICOTINE PATCH  21 MG FOR 30 DAYS, THEN 14 MG PATCH FOR 30 DAYS, AND THEN 7 MG PATCH FOR 30 DAYS  ?*If you need a refill on your cardiac medications before your next appointment, please call your pharmacy* ? ? ?Lab Work: ?NONE ?If you have labs (blood work) drawn today and your tests are completely normal, you will receive your results only by: ?MyChart Message (if you have MyChart) OR ?A paper copy in the mail ?If you have any lab test that is abnormal or we need to change your treatment, we will call you to review the results. ? ? ?Testing/Procedures: ?Your physician has requested that you have an echocardiogram. Echocardiography is a painless test that uses sound waves to create images of your heart. It provides your doctor with information about the size and shape of your heart and how well your heart?s chambers and valves are working. This procedure takes approximately one hour. There are no restrictions for this procedure.  ? ?ZIO XT- Long Term Monitor Instructions ? ?Your physician has requested you wear a ZIO patch monitor for 14 days.  ?This is a single patch monitor. [employer] supplies one patch monitor per enrollment. Additional ?stickers are not available. Please do not apply patch if you will be having a Nuclear Stress Test,  ?Echocardiogram, Cardiac CT, MRI, or Chest Xray during the period you would be wearing the  ?monitor. The patch cannot be worn during these tests. You cannot remove and re-apply the  ?ZIO XT patch monitor.  ?Your ZIO patch monitor will be mailed 3 day USPS to your address on file. It may take 3-5 days  ?to receive your monitor after you have been enrolled.  ?Once you have received your monitor, please review the enclosed instructions. Your monitor  ?has already been registered assigning a specific monitor serial # to you. ? ?Billing and Patient Assistance Program  Information ? ?We have supplied [employer] with any of your insurance information on file for billing purposes. ?[employer] offers a sliding scale Patient Assistance Program for patients that do not have  ?insurance, or whose insurance does not completely cover the cost of the ZIO monitor.  ?You must apply for the Patient Assistance Program to qualify for this discounted rate.  ?To apply, please call [employer] at [phone], select option 4, select option 2, ask to apply for  ?Patient Assistance Program. Meredeth Ide will ask your household income, and how many people  ?are in your household. They will quote your out-of-pocket cost based on that information.  ?[employer] will also be able to set up a 48-month, interest-free payment plan if needed. ? ?Applying the monitor ?  ?Shave hair from upper left chest.  ?Hold abrader disc by orange tab. Rub abrader in 40 strokes over the upper left chest as  ?indicated in your monitor instructions.  ?Clean area with 4 enclosed alcohol pads. Let dry.  ?Apply patch as indicated in monitor instructions. Patch will be placed under collarbone on left  ?side of chest with arrow pointing upward.  ?Rub patch adhesive wings for 2 minutes. Remove white label marked "1". Remove the white  ?label marked "2". Rub patch adhesive wings for 2 additional minutes.  ?While looking in a mirror, press and release button in center of patch. A small green light will  ?flash 3-4 times. This will be your only indicator that the monitor has been turned on.  ?  Do not shower for the first 24 hours. You may shower after the first 24 hours.  ?Press the button if you feel a symptom. You will hear a small click. Record Date, Time and  ?Symptom in the Patient Logbook.  ?When you are ready to remove the patch, follow instructions on the last 2 pages of Patient  ?Logbook. Stick patch monitor onto the last page of Patient Logbook.  ?Place Patient Logbook in the blue and white box. Use locking tab on box and tape box closed   ?securely. The blue and white box has prepaid postage on it. Please place it in the mailbox as  ?soon as possible. Your physician should have your test results approximately 7 days after the  ?monitor has been mailed back to [facility].  ?Call [employer] at [phone] if you have questions regarding  ?your ZIO XT patch monitor. Call them immediately if you see an orange light blinking on your  ?monitor.  ?If your monitor falls off in less than 4 days, contact our Monitor department at [phone].  ?If your monitor becomes loose or falls off after 4 days call [employer] at [phone] for  ?suggestions on securing your monitor  ? ? ?Follow-Up: ?At [facility], you and your health needs are our priority.  As part of our continuing mission to provide you with exceptional heart care, we have created designated Provider Care Teams.  These Care Teams include your primary Cardiologist (physician) and Advanced Practice Providers (APPs -  Physician Assistants and Nurse Practitioners) who all work together to provide you with the care you need, when you need it. ? ?We recommend signing up for the patient portal called "MyChart".  Sign up information is provided on this After Visit Summary.  MyChart is used to connect with patients for Virtual Visits (Telemedicine).  Patients are able to view lab/test results, encounter notes, upcoming appointments, etc.  Non-urgent messages can be sent to your provider as well.   ?To learn more about what you can do with MyChart, go to [email or web address].   ? ?Your next appointment:   ?3 month(s) ? ?The format for your next appointment:   ?In Person ? ?Provider:   ?Tonny Bollman, MD or Eligha Bridegroom, NP ? ?If primary card or EP is not listed click here to update    :1}  ? ? ?Other Instructions ?Please check your blood pressure 1 time per day for 2 weeks and send readings to Eligha Bridegroom, NP.  ? ?Important Information About Sugar ? ? ? ? ?  ?

## 2022-01-24 ENCOUNTER — Encounter: Payer: Self-pay | Admitting: Nurse Practitioner

## 2022-01-24 ENCOUNTER — Telehealth: Payer: Self-pay | Admitting: Nurse Practitioner

## 2022-01-24 DIAGNOSIS — I2510 Atherosclerotic heart disease of native coronary artery without angina pectoris: Secondary | ICD-10-CM

## 2022-01-24 DIAGNOSIS — E785 Hyperlipidemia, unspecified: Secondary | ICD-10-CM

## 2022-01-24 NOTE — Telephone Encounter (Signed)
Please call patient and ask him to come in fasting on the day of his echo for lab work. Orders have been placed for lipid and lfts.  ?

## 2022-01-26 ENCOUNTER — Other Ambulatory Visit: Payer: Self-pay | Admitting: *Deleted

## 2022-01-26 DIAGNOSIS — E785 Hyperlipidemia, unspecified: Secondary | ICD-10-CM

## 2022-01-26 NOTE — Telephone Encounter (Signed)
S/w pt is aware will come fasting on day of echo to get lipid/cmet.  Orders linked for labs and appt made.  ?

## 2022-01-27 DIAGNOSIS — R002 Palpitations: Secondary | ICD-10-CM

## 2022-02-16 ENCOUNTER — Other Ambulatory Visit: Payer: Self-pay | Admitting: *Deleted

## 2022-02-16 ENCOUNTER — Ambulatory Visit (HOSPITAL_COMMUNITY): Payer: Self-pay | Attending: Cardiovascular Disease

## 2022-02-16 DIAGNOSIS — I2510 Atherosclerotic heart disease of native coronary artery without angina pectoris: Secondary | ICD-10-CM

## 2022-02-16 DIAGNOSIS — R0600 Dyspnea, unspecified: Secondary | ICD-10-CM | POA: Insufficient documentation

## 2022-02-16 DIAGNOSIS — R002 Palpitations: Secondary | ICD-10-CM | POA: Insufficient documentation

## 2022-02-16 DIAGNOSIS — E785 Hyperlipidemia, unspecified: Secondary | ICD-10-CM

## 2022-02-16 LAB — COMPREHENSIVE METABOLIC PANEL
ALT: 18 IU/L (ref 0–44)
AST: 25 IU/L (ref 0–40)
Albumin/Globulin Ratio: 1.4 (ref 1.2–2.2)
Albumin: 4 g/dL (ref 3.8–4.9)
Alkaline Phosphatase: 132 IU/L — ABNORMAL HIGH (ref 44–121)
BUN/Creatinine Ratio: 9 (ref 9–20)
BUN: 8 mg/dL (ref 6–24)
Bilirubin Total: 0.4 mg/dL (ref 0.0–1.2)
CO2: 25 mmol/L (ref 20–29)
Calcium: 8.8 mg/dL (ref 8.7–10.2)
Chloride: 101 mmol/L (ref 96–106)
Creatinine, Ser: 0.9 mg/dL (ref 0.76–1.27)
Globulin, Total: 2.8 g/dL (ref 1.5–4.5)
Glucose: 120 mg/dL — ABNORMAL HIGH (ref 70–99)
Potassium: 3.9 mmol/L (ref 3.5–5.2)
Sodium: 138 mmol/L (ref 134–144)
Total Protein: 6.8 g/dL (ref 6.0–8.5)
eGFR: 103 mL/min/{1.73_m2} (ref >59)

## 2022-02-16 LAB — LIPID PANEL
Chol/HDL Ratio: 4.8 ratio (ref 0.0–5.0)
Cholesterol, Total: 135 mg/dL (ref 100–199)
HDL: 28 mg/dL — ABNORMAL LOW (ref >39)
LDL Chol Calc (NIH): 90 mg/dL (ref 0–99)
Triglycerides: 90 mg/dL (ref 0–149)
VLDL Cholesterol Cal: 17 mg/dL (ref 5–40)

## 2022-02-16 LAB — ECHOCARDIOGRAM COMPLETE
Area-P 1/2: 3.71 cm2
S' Lateral: 2.75 cm

## 2022-02-17 ENCOUNTER — Other Ambulatory Visit: Payer: Self-pay | Admitting: *Deleted

## 2022-02-17 DIAGNOSIS — E785 Hyperlipidemia, unspecified: Secondary | ICD-10-CM

## 2022-02-22 ENCOUNTER — Encounter: Payer: Self-pay | Admitting: Emergency Medicine

## 2022-02-22 ENCOUNTER — Emergency Department: Payer: Self-pay

## 2022-02-22 ENCOUNTER — Emergency Department
Admission: EM | Admit: 2022-02-22 | Discharge: 2022-02-22 | Disposition: A | Discharge status: Home / Self Care | Payer: Self-pay | Attending: Emergency Medicine | Admitting: Emergency Medicine

## 2022-02-22 ENCOUNTER — Other Ambulatory Visit: Payer: Self-pay

## 2022-02-22 DIAGNOSIS — I2510 Atherosclerotic heart disease of native coronary artery without angina pectoris: Secondary | ICD-10-CM | POA: Insufficient documentation

## 2022-02-22 DIAGNOSIS — R0789 Other chest pain: Secondary | ICD-10-CM | POA: Insufficient documentation

## 2022-02-22 DIAGNOSIS — I10 Essential (primary) hypertension: Secondary | ICD-10-CM | POA: Insufficient documentation

## 2022-02-22 LAB — CBC
HCT: 45.5 % (ref 39.0–52.0)
Hemoglobin: 14.9 g/dL (ref 13.0–17.0)
MCH: 29.8 pg (ref 26.0–34.0)
MCHC: 32.7 g/dL (ref 30.0–36.0)
MCV: 91 fL (ref 80.0–100.0)
Platelets: 252 K/uL (ref 150–400)
RBC: 5 MIL/uL (ref 4.22–5.81)
RDW: 13 % (ref 11.5–15.5)
WBC: 11.5 K/uL — ABNORMAL HIGH (ref 4.0–10.5)
nRBC: 0 % (ref 0.0–0.2)

## 2022-02-22 LAB — BASIC METABOLIC PANEL WITH GFR
Anion gap: 4 — ABNORMAL LOW (ref 5–15)
BUN: 8 mg/dL (ref 6–20)
CO2: 27 mmol/L (ref 22–32)
Calcium: 8.6 mg/dL — ABNORMAL LOW (ref 8.9–10.3)
Chloride: 108 mmol/L (ref 98–111)
Creatinine, Ser: 0.9 mg/dL (ref 0.61–1.24)
GFR, Estimated: >60 mL/min
Glucose, Bld: 111 mg/dL — ABNORMAL HIGH (ref 70–99)
Potassium: 3.4 mmol/L — ABNORMAL LOW (ref 3.5–5.1)
Sodium: 139 mmol/L (ref 135–145)

## 2022-02-22 LAB — TROPONIN I (HIGH SENSITIVITY)
Troponin I (High Sensitivity): 4 ng/L
Troponin I (High Sensitivity): 4 ng/L

## 2022-02-22 MED ORDER — ALUM & MAG HYDROXIDE-SIMETH 200-200-20 MG/5ML PO SUSP
30.0000 mL | Freq: Once | ORAL | Status: AC
  Administered 2022-02-22: 30 mL via ORAL
  Filled 2022-02-22: qty 30

## 2022-02-22 MED ORDER — FAMOTIDINE 20 MG PO TABS: 20.0000 mg | ORAL_TABLET | Freq: Two times a day (BID) | ORAL | 0 refills | Status: DC

## 2022-02-22 MED ORDER — FAMOTIDINE 20 MG PO TABS
40.0000 mg | ORAL_TABLET | Freq: Once | ORAL | Status: AC
Start: 2022-02-22 — End: 2022-02-22
  Administered 2022-02-22: 40 mg via ORAL
  Filled 2022-02-22: qty 2

## 2022-02-22 MED ORDER — ALUMINUM-MAGNESIUM-SIMETHICONE 200-200-20 MG/5ML PO SUSP: 30.0000 mL | Freq: Three times a day (TID) | ORAL | 0 refills | Status: DC

## 2022-02-22 NOTE — ED Provider Notes (Signed)
[facility] Provider Note    Event Date/Time   First MD Initiated Contact with Patient 02/22/22 1837     (approximate)   History   Chief Complaint: Chest Pain   HPI  Atilano Covelli is a 51 y.o. male with a history of hypertension hyperlipidemia smoking CAD with previous MI who comes ED complaining of left-sided chest pain at the third intercostal space.  To me he denies radiation.  No diaphoresis vomiting or shortness of breath.  Not exertional, not pleuritic.  Its been intermittent all day, lasting about a minute at a time, aggravating or factors.  Last episode was 4:56 PM this evening.     Physical Exam   Triage Vital Signs: ED Triage Vitals  Enc Vitals Group     BP 02/22/22 1728 127/87     Pulse Rate 02/22/22 1728 74     Resp 02/22/22 1728 18     Temp 02/22/22 1728 98.8 F (37.1 C)     Temp Source 02/22/22 1728 Oral     SpO2 02/22/22 1728 95 %     Weight 02/22/22 1727 185 lb (83.9 kg)     Height 02/22/22 1727 5\' 9"  (1.753 m)     Head Circumference --      Peak Flow --      Pain Score 02/22/22 1726 6     Pain Loc --      Pain Edu? --      Excl. in GC? --     Most recent vital signs: Vitals:   02/22/22 1728  BP: 127/87  Pulse: 74  Resp: 18  Temp: 98.8 F (37.1 C)  SpO2: 95%    General: Awake, no distress.  CV:  Good peripheral perfusion.  Regular rate rhythm Resp:  Normal effort.  Clear to auscultation bilaterally Abd:  No distention.  Soft nontender Other:  Lower extremity edema.  Nontoxic   ED Results / Procedures / Treatments   Labs (all labs ordered are listed, but only abnormal results are displayed) Labs Reviewed  BASIC METABOLIC PANEL - Abnormal; Notable for the following components:      Result Value   Potassium 3.4 (*)    Glucose, Bld 111 (*)    Calcium 8.6 (*)    Anion gap 4 (*)    All other components within normal limits  CBC - Abnormal; Notable for the following components:   WBC 11.5 (*)    All other  components within normal limits  TROPONIN I (HIGH SENSITIVITY)  TROPONIN I (HIGH SENSITIVITY)     EKG Interpreted by me Normal sinus rhythm rate of 74.  Normal axis intervals QRS ST segments and T waves   RADIOLOGY Chest x-ray interpreted by me, appears normal.  Radiology report reviewed   PROCEDURES:  Procedures   MEDICATIONS ORDERED IN ED: Medications  alum & mag hydroxide-simeth (MAALOX/MYLANTA) 200-200-20 MG/5ML suspension 30 mL (has no administration in time range)  famotidine (PEPCID) tablet 40 mg (has no administration in time range)     IMPRESSION / MDM / ASSESSMENT AND PLAN / ED COURSE  I reviewed the triage vital signs and the nursing notes.                              Differential diagnosis includes, but is not limited to, non-STEMI, GERD, muscle spasm, pneumothorax  Patient's presentation is most consistent with acute presentation with potential threat to life or bodily  function.  Patient presents with chest pain, atypical.   Considering the patient's symptoms, medical history, and physical examination today, I have low suspicion for ACS, PE, TAD, pneumothorax, carditis, mediastinitis, pneumonia, CHF, or sepsis.  EKG, chest x-ray, vitals, exam, initial labs all reassuring.  With his prior history, will repeat troponin and if normal he can be discharged to follow-up with cardiology.  I reviewed outside records from cardiology which show over the last 2 weeks he has had an echocardiogram as well as a heart monitor which were both unremarkable.  Recommend trial of antacid therapy.       FINAL CLINICAL IMPRESSION(S) / ED DIAGNOSES   Final diagnoses:  Atypical chest pain     Rx / DC Orders   ED Discharge Orders          Ordered    famotidine (PEPCID) 20 MG tablet  2 times daily        02/22/22 1912    aluminum-magnesium hydroxide-simethicone (MAALOX) 200-200-20 MG/5ML SUSP  3 times daily before meals & bedtime        02/22/22 1912              Note:  This document was prepared using Dragon voice recognition software and may include unintentional dictation errors.   Sharman Cheek, MD 02/22/22 [phone]

## 2022-02-22 NOTE — ED Triage Notes (Signed)
Pt via POV from home. Pt c/o intermittent L sided CP that radiates down his L arm that started today. Pt also feels that his heart racing. Pt had a MI 8 years ago. Denies cough. Denies fever. Pt is A&OX4 and NAD

## 2022-02-24 ENCOUNTER — Emergency Department
Admission: EM | Admit: 2022-02-24 | Discharge: 2022-02-25 | Disposition: A | Discharge status: Home / Self Care | Payer: Self-pay | Attending: Emergency Medicine | Admitting: Emergency Medicine

## 2022-02-24 ENCOUNTER — Other Ambulatory Visit: Payer: Self-pay

## 2022-02-24 ENCOUNTER — Encounter: Payer: Self-pay | Admitting: *Deleted

## 2022-02-24 ENCOUNTER — Emergency Department: Payer: Self-pay

## 2022-02-24 DIAGNOSIS — I10 Essential (primary) hypertension: Secondary | ICD-10-CM | POA: Insufficient documentation

## 2022-02-24 DIAGNOSIS — M5412 Radiculopathy, cervical region: Secondary | ICD-10-CM | POA: Insufficient documentation

## 2022-02-24 DIAGNOSIS — I2510 Atherosclerotic heart disease of native coronary artery without angina pectoris: Secondary | ICD-10-CM | POA: Insufficient documentation

## 2022-02-24 LAB — BASIC METABOLIC PANEL
Anion gap: 6 (ref 5–15)
BUN: 10 mg/dL (ref 6–20)
CO2: 30 mmol/L (ref 22–32)
Calcium: 8.9 mg/dL (ref 8.9–10.3)
Chloride: 104 mmol/L (ref 98–111)
Creatinine, Ser: 0.85 mg/dL (ref 0.61–1.24)
GFR, Estimated: >60 mL/min (ref >60)
Glucose, Bld: 88 mg/dL (ref 70–99)
Potassium: 4.2 mmol/L (ref 3.5–5.1)
Sodium: 140 mmol/L (ref 135–145)

## 2022-02-24 LAB — CBC WITH DIFFERENTIAL/PLATELET
Abs Immature Granulocytes: 0.06 10*3/uL (ref 0.00–0.07)
Basophils Absolute: 0.1 10*3/uL (ref 0.0–0.1)
Basophils Relative: 0 %
Eosinophils Absolute: 0.4 10*3/uL (ref 0.0–0.5)
Eosinophils Relative: 3 %
HCT: 47.3 % (ref 39.0–52.0)
Hemoglobin: 15.7 g/dL (ref 13.0–17.0)
Immature Granulocytes: 0 %
Lymphocytes Relative: 28 %
Lymphs Abs: 3.8 10*3/uL (ref 0.7–4.0)
MCH: 30.1 pg (ref 26.0–34.0)
MCHC: 33.2 g/dL (ref 30.0–36.0)
MCV: 90.6 fL (ref 80.0–100.0)
Monocytes Absolute: 1.3 10*3/uL — ABNORMAL HIGH (ref 0.1–1.0)
Monocytes Relative: 10 %
Neutro Abs: 7.8 10*3/uL — ABNORMAL HIGH (ref 1.7–7.7)
Neutrophils Relative %: 59 %
Platelets: 270 10*3/uL (ref 150–400)
RBC: 5.22 MIL/uL (ref 4.22–5.81)
RDW: 13 % (ref 11.5–15.5)
WBC: 13.4 10*3/uL — ABNORMAL HIGH (ref 4.0–10.5)
nRBC: 0 % (ref 0.0–0.2)

## 2022-02-24 LAB — TROPONIN I (HIGH SENSITIVITY): Troponin I (High Sensitivity): 4 ng/L (ref <18)

## 2022-02-24 MED ORDER — DEXAMETHASONE SODIUM PHOSPHATE 10 MG/ML IJ SOLN
10.0000 mg | Freq: Once | INTRAMUSCULAR | Status: AC
  Administered 2022-02-24: 10 mg via INTRAVENOUS
  Filled 2022-02-24: qty 1

## 2022-02-24 NOTE — ED Provider Notes (Incomplete)
Prior  [facility] Provider Note    Event Date/Time   First MD Initiated Contact with Patient 02/24/22 2058     (approximate)   History   Torticollis   HPI  Kenneth Medina is a 51 y.o. male with a past medical history of CAD, STEMI s/p PCI of LAD using DES (2015), tobacco use, hyperlipidemia, hypertension who presents today for evaluation of neck and left arm pain.  Patient reports that 2 days ago he began to have numbness and tingling in his second, third, and fourth fingertips, and today he noticed that the fingertips were completely numb.  Yesterday morning, he awoke with pain in his neck, specifically at the base of his neck.  He has also noticed throughout the course of the day that he has developed fasciculations in his left upper arm.  He denies headache, vision changes, dizziness, nausea, vomiting.  He denies any lower extremity symptoms.  He has not had any bowel or bladder problems.  No fevers or chills.  He denies any injuries..  I reviewed the patient's chart.  Patient was seen in the emergency department 2 days ago for evaluation of left-sided chest pain.  Patient had EKG, chest x-ray, labs including 2 troponins which were negative, and then he was treated with H2 blockers and Maalox.  Patient had a long-term monitor that was completed on 02/16/2022 which demonstrated no atrial fibrillation or flutter, no concerning arrhythmias or pauses.  He had rare PVCs.  He also had an echo on 02/16/2022 which demonstrated a normal ejection fraction, no regional wall motion abnormalities.  Patient Active Problem List   Diagnosis Date Noted   Essential hypertension 05/31/2016   Mild sleep apnea 05/31/2016   Hyperlipidemia 02/12/2014   Cardiomyopathy, ischemic 02/12/2014   CAD (coronary artery disease) 01/24/2014   Tobacco use 01/24/2014   History of acute anterior wall MI 01/23/2014          Physical Exam   Triage Vital Signs: ED Triage Vitals  Enc Vitals  Group     BP 02/24/22 2031 126/78     Pulse Rate 02/24/22 2031 65     Resp 02/24/22 2031 16     Temp 02/24/22 2031 98 F (36.7 C)     Temp Source 02/24/22 2031 Oral     SpO2 02/24/22 2031 98 %     Weight 02/24/22 2035 185 lb (83.9 kg)     Height 02/24/22 2035 5\' 9"  (1.753 m)     Head Circumference --      Peak Flow --      Pain Score 02/24/22 2035 9     Pain Loc --      Pain Edu? --      Excl. in [place]? --     Most recent vital signs: Vitals:   02/24/22 2031  BP: 126/78  Pulse: 65  Resp: 16  Temp: 98 F (36.7 C)  SpO2: 98%    Physical Exam Vitals and nursing note reviewed.  Constitutional:      General: Awake and alert. No acute distress.    Appearance: Normal appearance. He is well-developed and normal weight.  HENT:     Head: Normocephalic and atraumatic.     Mouth/Throat:     Mouth: Mucous membranes are moist.  Eyes:     General: PERRL. Normal EOMs        Right eye: No discharge.        Left eye: No discharge.  Conjunctiva/sclera: Conjunctivae normal.  Cardiovascular:     Rate and Rhythm: Normal rate and regular rhythm.     Pulses: Normal pulses.     Heart sounds: Normal heart sounds Pulmonary:     Effort: Pulmonary effort is normal. No respiratory distress.     Breath sounds: Normal breath sounds.  Abdominal:     Abdomen is soft. There is no abdominal tenderness. No rebound or guarding. No distention. Musculoskeletal:        General: No swelling. Normal range of motion.     Cervical back: Normal range of motion and neck supple. Midline cervical spine tenderness to C6/C7.  Positive Spurling test to the left, positive distraction test, reproducible pain with Lhermitte. 4/5 strength to biceps, triceps. Able to give thumbs up, cross fingers, 4/5 strength with making circle with thumb and first finger.  Decreased sensation to tips of second, third, fourth fingertips on the palmar aspect, and decreased sensation to the level of the PIP on the dorsum of hand.   Sensation intact to light touch throughout rest of arm.  Palpable fasciculations to left upper arm tricep area. 4/5 grip strength on left. 5/5 strength throughout right arm. Normal radial pulses bilaterally. 5/5 strength and sensation to bilateral lower extremities. Skin:    General: Skin is warm and dry.     Capillary Refill: Capillary refill takes less than 2 seconds.     Findings: No rash.  Neurological:     Mental Status: He is alert.      ED Results / Procedures / Treatments   Labs (all labs ordered are listed, but only abnormal results are displayed) Labs Reviewed - No data to display   EKG     RADIOLOGY ***    PROCEDURES:  Critical Care performed:   Procedures   MEDICATIONS ORDERED IN ED: Medications - No data to display   IMPRESSION / MDM / [place] / ED COURSE  I reviewed the triage vital signs and the nursing notes.   Differential diagnosis includes, but is not limited to, ***   Patient's presentation is most consistent with {EM COPA:27473}   {If the patient is on the monitor, remove the brackets and asterisks on the sentence below and remember to document it as a Procedure as well. Otherwise delete the sentence below:1} {**The patient is on the cardiac monitor to evaluate for evidence of arrhythmia and/or significant heart rate changes.**} {Remember to include, when applicable, any/all of the following data: independent review of imaging independent review of labs (comment specifically on pertinent positives and negatives) review of specific prior hospitalizations, PCP/specialist notes, etc. discuss meds given and prescribed document any discussion with consultants (including hospitalists) any clinical decision tools you used and why (PECARN, NEXUS, etc.) did you consider admitting the patient? document social determinants of health affecting patient's care (homelessness, inability to follow up in a timely fashion, etc) document any  pre-existing conditions increasing risk on current visit (e.g. diabetes and HTN increasing danger of high-risk chest pain/ACS) describes what meds you gave (especially parenteral) and why any other interventions?:1}     FINAL CLINICAL IMPRESSION(S) / ED DIAGNOSES   Final diagnoses:  None     Rx / DC Orders   ED Discharge Orders     None        Note:  This document was prepared using Dragon voice recognition software and may include unintentional dictation errors.

## 2022-02-24 NOTE — ED Provider Notes (Incomplete)
Prior  [facility] Provider Note    Event Date/Time   First MD Initiated Contact with Patient 02/24/22 2058     (approximate)   History   Torticollis   HPI  Kenneth Medina is a 51 y.o. male with a past medical history of CAD, STEMI s/p PCI of LAD using DES (2015), tobacco use, hyperlipidemia, hypertension who presents today for evaluation of neck and left arm pain.  Patient reports that 2 days ago he began to have numbness and tingling in his second, third, and fourth fingertips, and today he noticed that the fingertips were completely numb.  Yesterday morning, he awoke with pain in his neck, specifically at the base of his neck.  He has also noticed throughout the course of the day that he has developed fasciculations in his left upper arm.  He denies headache, vision changes, dizziness, nausea, vomiting.  He denies any lower extremity symptoms.  He has not had any bowel or bladder problems.  No fevers or chills.  He denies any injuries. Reports that he feels his left hand grip strength is weak today. No lower extremity symptoms.  I reviewed the patient's chart.  Patient was seen in the emergency department 2 days ago for evaluation of left-sided chest pain.  Patient had EKG, chest x-ray, labs including 2 troponins which were negative, and then he was treated with H2 blockers and Maalox.  Patient had a long-term monitor that was completed on 02/16/2022 which demonstrated no atrial fibrillation or flutter, no concerning arrhythmias or pauses.  He had rare PVCs.  He also had an echo on 02/16/2022 which demonstrated a normal ejection fraction, no regional wall motion abnormalities.  Patient Active Problem List   Diagnosis Date Noted  . Essential hypertension 05/31/2016  . Mild sleep apnea 05/31/2016  . Hyperlipidemia 02/12/2014  . Cardiomyopathy, ischemic 02/12/2014  . CAD (coronary artery disease) 01/24/2014  . Tobacco use 01/24/2014  . History of acute anterior wall MI  01/23/2014          Physical Exam   Triage Vital Signs: ED Triage Vitals  Enc Vitals Group     BP 02/24/22 2031 126/78     Pulse Rate 02/24/22 2031 65     Resp 02/24/22 2031 16     Temp 02/24/22 2031 98 F (36.7 C)     Temp Source 02/24/22 2031 Oral     SpO2 02/24/22 2031 98 %     Weight 02/24/22 2035 185 lb (83.9 kg)     Height 02/24/22 2035 5\' 9"  (1.753 m)     Head Circumference --      Peak Flow --      Pain Score 02/24/22 2035 9     Pain Loc --      Pain Edu? --      Excl. in [place]? --     Most recent vital signs: Vitals:   02/24/22 2031 02/24/22 2200  BP: 126/78 122/76  Pulse: 65 61  Resp: 16 17  Temp: 98 F (36.7 C)   SpO2: 98% 92%    Physical Exam Vitals and nursing note reviewed.  Constitutional:      General: Awake and alert. No acute distress.    Appearance: Normal appearance. He is well-developed and normal weight.  HENT:     Head: Normocephalic and atraumatic.     Mouth/Throat:     Mouth: Mucous membranes are moist.  Eyes:     General: PERRL. Normal EOMs  Right eye: No discharge.        Left eye: No discharge.     Conjunctiva/sclera: Conjunctivae normal.  Cardiovascular:     Rate and Rhythm: Normal rate and regular rhythm.     Pulses: Normal pulses.     Heart sounds: Normal heart sounds Pulmonary:     Effort: Pulmonary effort is normal. No respiratory distress.     Breath sounds: Normal breath sounds.  Abdominal:     Abdomen is soft. There is no abdominal tenderness. No rebound or guarding. No distention. Musculoskeletal:        General: No swelling. Normal range of motion.     Cervical back: Normal range of motion and neck supple. Midline cervical spine tenderness to C6/C7.  Positive Spurling test to the left, positive distraction test, reproducible pain with Lhermitte. Negative Hoffman sign. 4/5 strength to biceps, triceps. Able to flex and extend wrist against resistance. Able to give thumbs up, cross fingers, 4/5 strength with making  circle with thumb and first finger.  Decreased sensation to tips of second, third, fourth fingertips on the palmar aspect, and decreased sensation to the level of the PIP on the dorsum of hand.  Sensation intact to light touch throughout rest of arm.  Palpable fasciculations to left upper arm tricep area. 4/5 grip strength on left. 5/5 strength throughout right arm. Normal radial pulses bilaterally. 5/5 strength and sensation to bilateral lower extremities. Skin:    General: Skin is warm and dry.     Capillary Refill: Capillary refill takes less than 2 seconds.     Findings: No rash.  Neurological:     Mental Status: He is alert.      ED Results / Procedures / Treatments   Labs (all labs ordered are listed, but only abnormal results are displayed) Labs Reviewed  CBC WITH DIFFERENTIAL/PLATELET - Abnormal; Notable for the following components:      Result Value   WBC 13.4 (*)    Neutro Abs 7.8 (*)    Monocytes Absolute 1.3 (*)    All other components within normal limits  BASIC METABOLIC PANEL  TROPONIN I (HIGH SENSITIVITY)  TROPONIN I (HIGH SENSITIVITY)     EKG     RADIOLOGY IMPRESSION:  1. Left paracentral disc protrusion at C6-7 contacting and mildly  flattening the left hemi cord with resultant mild to moderate spinal  stenosis. Finding could contribute to left-sided symptoms.  2. Disc bulge with uncovertebral hypertrophy at C5-6 with resultant  moderate spinal stenosis, with moderate left worse than right C6  foraminal narrowing.  3. Disc bulge with uncovertebral hypertrophy at C4-5 with resultant  mild to moderate spinal stenosis, with mild to moderate left greater  than right C5 foraminal narrowing.      PROCEDURES:  Critical Care performed:   Procedures   MEDICATIONS ORDERED IN ED: Medications  dexamethasone (DECADRON) injection 10 mg (10 mg Intravenous Given 02/24/22 2323)     IMPRESSION / MDM / ASSESSMENT AND PLAN / ED COURSE  I reviewed the triage  vital signs and the nursing notes.   Differential diagnosis includes, but is not limited to, cervical radiculopathy, epidural hematoma, epidural abscess, other cord compression.  Patient is awake and alert, hemodynamically stable and afebrile.  He has weakness in his left biceps, triceps, and grip strength with muscle fasciculations in his triceps with positive Spurling sign and distraction test to left side, suggest of cervical radiculopathy. Given weakness and sensation changes, MRI cervical spine obtained. He was given  a dose of decadron given radicular symptoms.  MRI reveals left paracentral disc protrusion at C6-C7 contracting and mildly flattening the left hemicord with resultant mild to moderate spinal stenosis, as well as disc bulge at C5-C6 and C4-C6. These findings were discussed with Dr. Izora Ribas, neurosurgery on call.  Given his true muscle weakness and muscle fasiculations   Patient's presentation is most consistent with acute presentation with potential threat to life or bodily function.    Clinical Course as of 02/25/22 0003  Tue Feb 24, 2022  2345 Dr. Izora Ribas paged [JP]  2359 Secure chat also sent to Dr. Izora Ribas [JP]    Clinical Course User Index [JP] Jak Haggar, Clarnce Flock, PA-C     FINAL CLINICAL IMPRESSION(S) / ED DIAGNOSES   Final diagnoses:  Cervical radiculopathy at C6     Rx / DC Orders   ED Discharge Orders     None        Note:  This document was prepared using Dragon voice recognition software and may include unintentional dictation errors.

## 2022-02-24 NOTE — ED Notes (Signed)
Provider at the bedside.  

## 2022-02-24 NOTE — Discharge Instructions (Signed)
Take the medications as prescribed.  Please return the emergency department immediately for any new, worsening, or change in symptoms or other concerns as we discussed.  Please follow-up with Dr. Myer Haff, call his office tomorrow.

## 2022-02-24 NOTE — ED Triage Notes (Signed)
Pt has neck pain for 3 days.  No known injury.  Pt has pain in left shoulder/back.  No chest pain or sob.  Pt was seen here 2 days ago for similar sx.  Pt alert  speech clear.

## 2022-02-25 MED ORDER — PREDNISONE 10 MG (21) PO TBPK
ORAL_TABLET | ORAL | 0 refills | Status: DC
Start: 2022-02-24 — End: 2022-03-09

## 2022-03-02 ENCOUNTER — Telehealth: Payer: Self-pay | Admitting: *Deleted

## 2022-03-02 NOTE — Telephone Encounter (Signed)
Primary Cardiologist:Sohan Excell Seltzer, MD  Chart reviewed as part of pre-operative protocol coverage. Because of Kenneth Medina past medical history and time since last visit, he/she will require a follow-up visit in order to better assess preoperative cardiovascular risk.  Pre-op covering staff: - Please schedule appointment and call patient to inform them. - Please contact requesting surgeon's office via preferred method (i.e, phone, fax) to inform them of need for appointment prior to surgery.  If applicable, this message will also be routed to pharmacy pool and/or primary cardiologist for input on holding anticoagulant/antiplatelet agent as requested below so that this information is available at time of patient's appointment.   Ronney Asters, NP  03/02/2022, 9:48 AM

## 2022-03-02 NOTE — Telephone Encounter (Signed)
   Pre-operative Risk Assessment    Patient Name: Kenneth Medina  DOB: [birth year] MRN: [id number]      Request for Surgical Clearance    Procedure:   CERVICAL EPIDURAL INJECTION  Date of Surgery:  Clearance 03/18/22                                 Surgeon:  DR. [facility] Surgeon's Group or Practice Name:  [facility] DEPT OF PHYSIATRY Phone number:  [phone] Fax number:  [phone] ATTN: Paula Compton B. CMA   Type of Clearance Requested:   - Medical  - Pharmacy:  Hold Clopidogrel (Plavix) x 7 DAYS PRIOR TO INJECTION   Type of Anesthesia:  Not Indicated   Additional requests/questions:    [name]   03/02/2022, 8:31 AM

## 2022-03-02 NOTE — Telephone Encounter (Signed)
I s/w the pt and he is agreeable to plan of care for in office appt for pre op clearance. Pt has been scheduled to see Eligha Bridegroom, NP 03/09/22. I will send notes to NP for appt. Will send FYI to requesting office the pt has appt.

## 2022-03-09 ENCOUNTER — Ambulatory Visit (INDEPENDENT_AMBULATORY_CARE_PROVIDER_SITE_OTHER): Payer: Self-pay | Admitting: Nurse Practitioner

## 2022-03-09 ENCOUNTER — Encounter: Payer: Self-pay | Admitting: Nurse Practitioner

## 2022-03-09 VITALS — BP 120/80 | HR 77 | Ht 69.0 in | Wt 173.0 lb

## 2022-03-09 DIAGNOSIS — Z01810 Encounter for preprocedural cardiovascular examination: Secondary | ICD-10-CM

## 2022-03-09 DIAGNOSIS — R002 Palpitations: Secondary | ICD-10-CM

## 2022-03-09 DIAGNOSIS — I10 Essential (primary) hypertension: Secondary | ICD-10-CM

## 2022-03-09 DIAGNOSIS — E785 Hyperlipidemia, unspecified: Secondary | ICD-10-CM

## 2022-03-09 DIAGNOSIS — I255 Ischemic cardiomyopathy: Secondary | ICD-10-CM

## 2022-03-09 DIAGNOSIS — R0600 Dyspnea, unspecified: Secondary | ICD-10-CM

## 2022-03-09 DIAGNOSIS — I2510 Atherosclerotic heart disease of native coronary artery without angina pectoris: Secondary | ICD-10-CM

## 2022-03-12 ENCOUNTER — Ambulatory Visit: Payer: Self-pay

## 2022-03-24 ENCOUNTER — Ambulatory Visit: Payer: Self-pay

## 2022-03-24 NOTE — Progress Notes (Deleted)
Patient ID: Kenneth Medina                 DOB: 04-24-1971                    MRN: [id number]     HPI: Kenneth Medina is a 51 y.o. male patient of Dr. Earmon Medina referred to lipid clinic by Kenneth Medina. PMH is significant for hypertension, CAD s/p PCI/DES to LAD, ICM, and mild sleep apnea.   Tobacco Diet Exercise insurnace  Current Medications: atorvastatin 80mg  daily, ezetimibe 10mg  daily Intolerances:  Risk Factors: tobacco abuse, premature CAD LDL goal: <55  Diet:   Exercise:   Family History: The patient's family history includes Cancer in his maternal grandfather and maternal grandmother; Hypertension in his mother. There is no history of Heart attack or Stroke.  Social History:   Labs: 02/16/22 TC 135, G 90 HDL 28 LDL- C 90 (atorvastatin 80mg  daily, ezetimibe 10mg  daily)  Past Medical History:  Diagnosis Date   Coronary artery disease    a. s/p ant STEMI 5/15 >> LHC (5/15):  prox D1 30-40%, LAD occluded, prox PDA 20-30%, mid to dist Ant HK, EF 45% >>> PCI:  2.75x22 mm Resolute DES to mid LAD   Hyperlipidemia    Hypertension    Ischemic cardiomyopathy    a. EF 45% at [facility] at time of MI >> b. Echo (5/15):  Apical HK, no clot, EF 50%, mod LVH, normal RVF   MI (myocardial infarction) (HCC)    2015   Tobacco abuse     Current Outpatient Medications on File Prior to Visit  Medication Sig Dispense Refill   aspirin 81 MG chewable tablet Chew 1 tablet (81 mg total) by mouth daily.     atorvastatin (LIPITOR) 80 MG tablet Take 1 tablet (80 mg total) by mouth daily at 6 PM. 90 tablet 3   carvedilol (COREG) 6.25 MG tablet Take 1 tablet (6.25 mg total) by mouth 2 (two) times daily with a meal. 180 tablet 3   ezetimibe (ZETIA) 10 MG tablet Take 1 tablet (10 mg total) by mouth daily. 90 tablet 3   lisinopril (ZESTRIL) 2.5 MG tablet Take 1 tablet (2.5 mg total) by mouth daily. 90 tablet 3   nicotine (NICODERM CQ - DOSED IN MG/24 HOURS) 14 mg/24hr patch Place 1 patch (14 mg total)  onto the skin daily. FOR 30 DAYS 30 patch 0   nicotine (NICODERM CQ - DOSED IN MG/24 HOURS) 21 mg/24hr patch Place 1 patch (21 mg total) onto the skin daily. FOR 30 DAYS 30 patch 0   nicotine (NICODERM CQ) 7 mg/24hr patch Place 1 patch (7 mg total) onto the skin daily. FOR 30 DAYS 30 patch 0   nitroGLYCERIN (NITROSTAT) 0.4 MG SL tablet Place 1 tablet (0.4 mg total) under the tongue every 5 (five) minutes x 3 doses as needed for chest pain. 25 tablet 5   ondansetron (ZOFRAN ODT) 4 MG disintegrating tablet Take 1 tablet (4 mg total) by mouth every 8 (eight) hours as needed for nausea or vomiting. 15 tablet 0   No current facility-administered medications on file prior to visit.    Allergies  Allergen Reactions   Ibuprofen Hives    Assessment/Plan:  1. Hyperlipidemia -    Thank you,   04-25-1984, Pharm.D, BCPS, CPP [facility]  [address], [place], [address] [place]  Phone: [phone]; Fax: [phone]

## 2022-05-07 ENCOUNTER — Emergency Department: Payer: Self-pay

## 2022-05-07 ENCOUNTER — Other Ambulatory Visit: Payer: Self-pay

## 2022-05-07 ENCOUNTER — Emergency Department
Admission: EM | Admit: 2022-05-07 | Discharge: 2022-05-07 | Disposition: A | Discharge status: Home / Self Care | Payer: Self-pay | Attending: Emergency Medicine | Admitting: Emergency Medicine

## 2022-05-07 DIAGNOSIS — Y92410 Unspecified street and highway as the place of occurrence of the external cause: Secondary | ICD-10-CM | POA: Insufficient documentation

## 2022-05-07 DIAGNOSIS — M545 Low back pain, unspecified: Secondary | ICD-10-CM | POA: Insufficient documentation

## 2022-05-07 MED ORDER — METHOCARBAMOL 500 MG PO TABS: 500.0000 mg | ORAL_TABLET | Freq: Three times a day (TID) | ORAL | 0 refills | Status: AC | PRN

## 2022-05-07 MED ORDER — ACETAMINOPHEN 325 MG PO TABS
650.0000 mg | ORAL_TABLET | Freq: Once | ORAL | Status: AC
  Administered 2022-05-07: 650 mg via ORAL
  Filled 2022-05-07: qty 2
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
## 2022-05-07 NOTE — Discharge Instructions (Signed)
You can take Robaxin at night before bed for muscle spasms and tightness.
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
## 2022-05-11 ENCOUNTER — Ambulatory Visit: Payer: Self-pay | Admitting: Cardiovascular Disease

## 2022-08-17 IMAGING — MR MR CERVICAL SPINE W/O CM
5 series · 36 of 48 positions shown · non-contrast
Comparison: Prior MRI from 11/17/2016.

CLINICAL DATA: Initial evaluation for neck trauma, left upper
extremity radicular symptoms.

EXAM:
MRI CERVICAL SPINE WITHOUT CONTRAST
TECHNIQUE: Multiplanar, multisequence MR imaging of the cervical spine was
performed. No intravenous contrast was administered.

[Series 5: T2 · sagittal · 3.0mm · 0.62mm/px · 6 of 15 slices shown (1 of 2)]
[im 1/15]
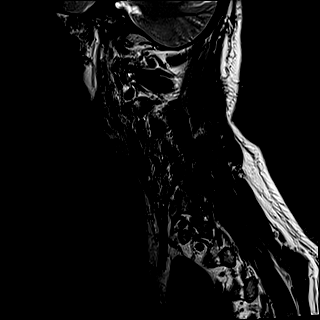
[im 3/15]
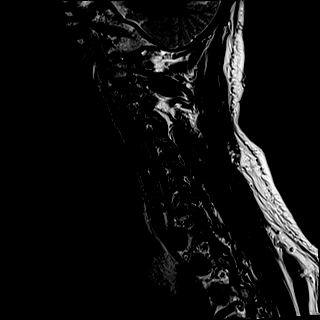
[im 6/15]
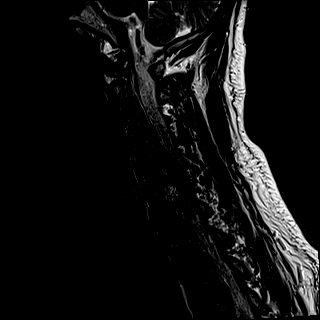
[im 9/15]
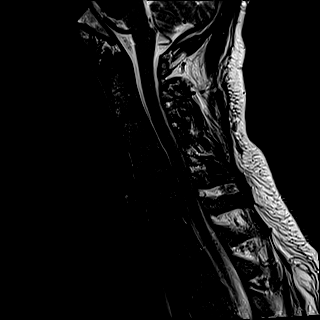
[im 12/15]
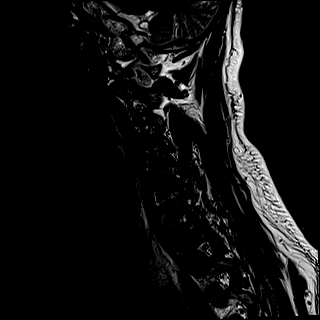
[im 15/15]
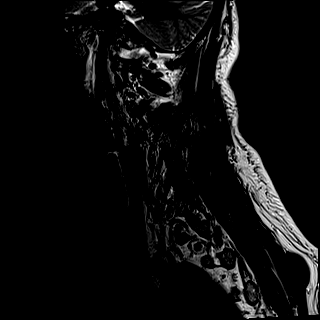

[Series 6: FLAIR · sagittal · 3.0mm · 0.78mm/px · 7 of 15 slices shown]
[im 1/15]
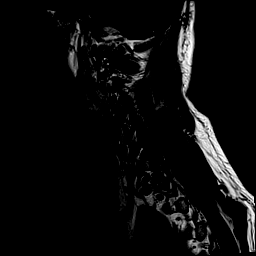
[im 3/15]
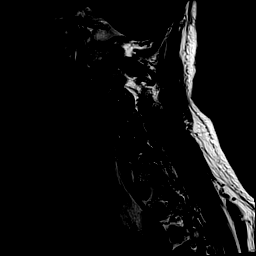
[im 5/15]
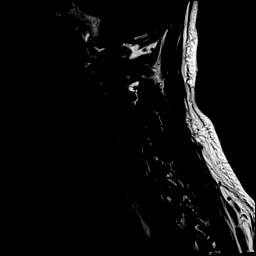
[im 8/15]
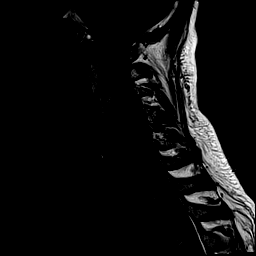
[im 10/15]
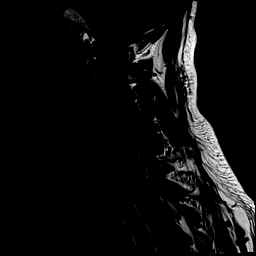
[im 12/15]
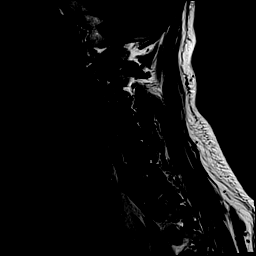
[im 15/15]
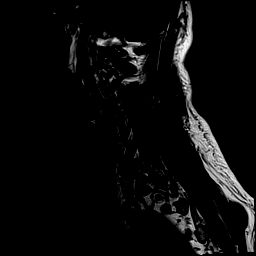

[Series 7: STIR · sagittal · 3.0mm · 0.62mm/px · 7 of 15 slices shown]
[im 1/15]
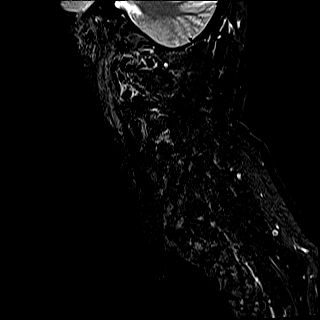
[im 3/15]
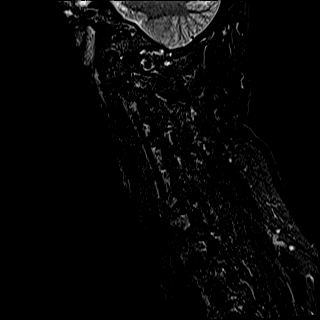
[im 5/15]
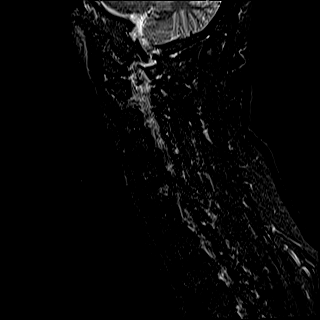
[im 8/15]
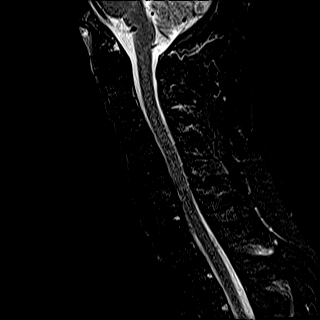
[im 10/15]
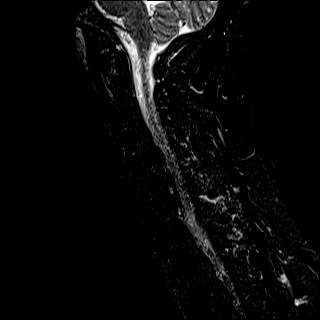
[im 12/15]
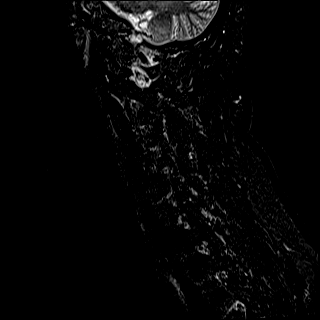
[im 15/15]
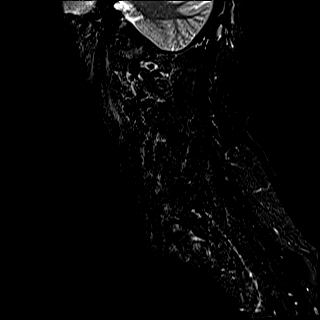

[Series 8: T2 · axial · 3.0mm · 0.70mm/px · z∈[-113,-20]mm · 8 of 29 slices shown (2 of 2)]
[im 1/29]
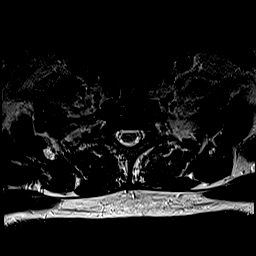
[im 5/29]
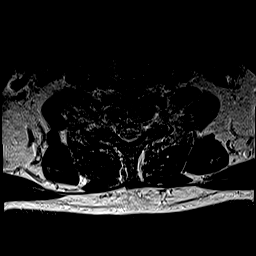
[im 9/29]
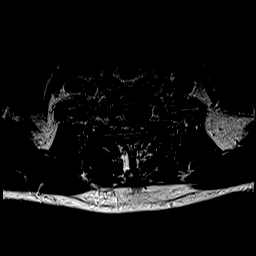
[im 13/29]
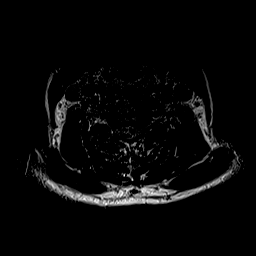
[im 16/29]
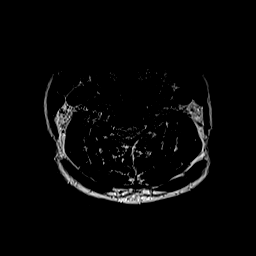
[im 20/29]
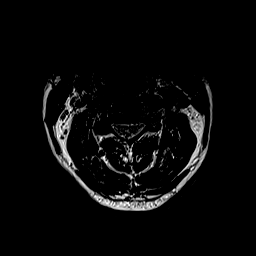
[im 24/29]
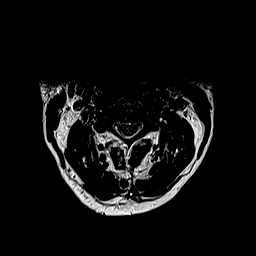
[im 29/29]
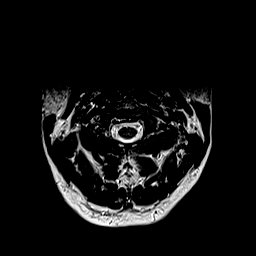

[Series 9: ax mpgr · axial · 3.0mm · 0.35mm/px · z∈[-113,-20]mm · 8 of 29 slices shown]
[im 1/29]
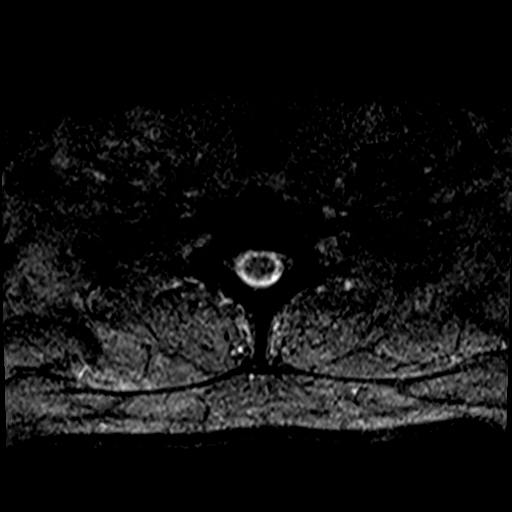
[im 5/29]
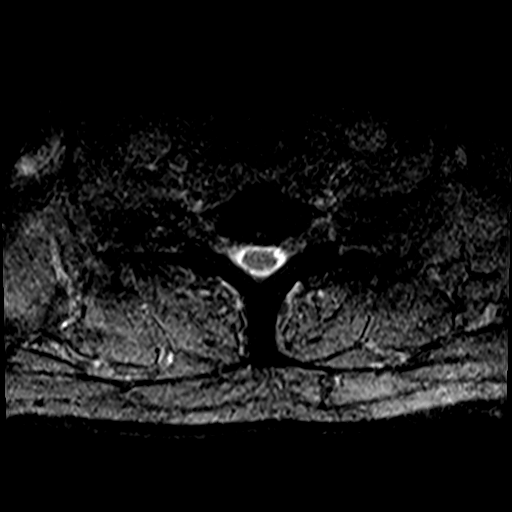
[im 9/29]
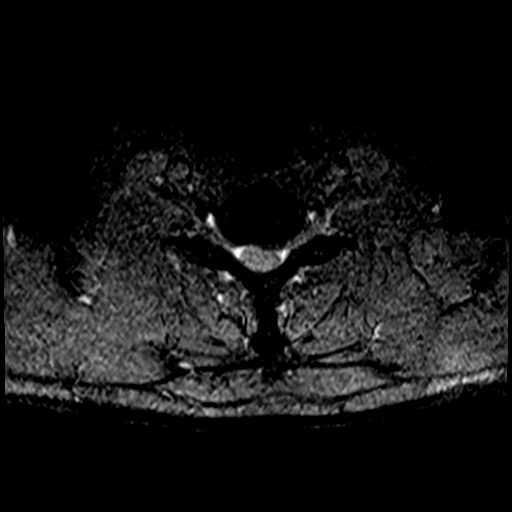
[im 13/29]
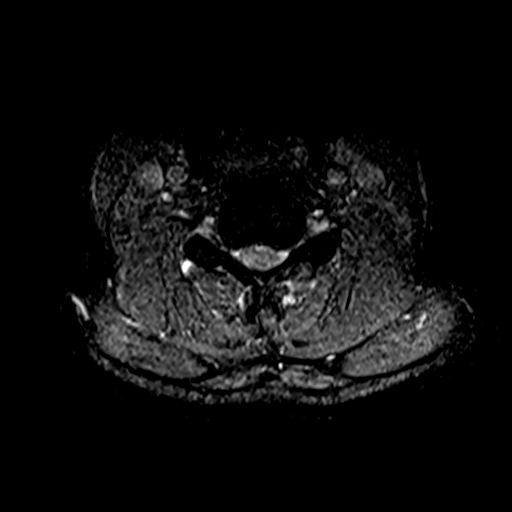
[im 16/29]
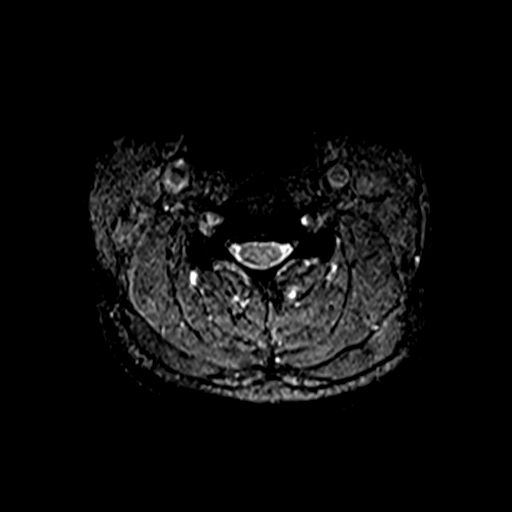
[im 20/29]
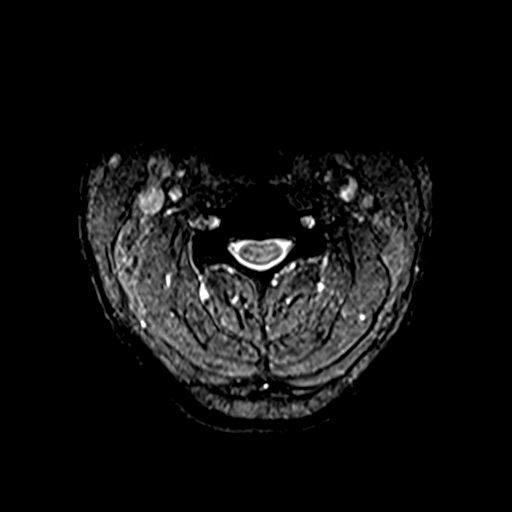
[im 24/29]
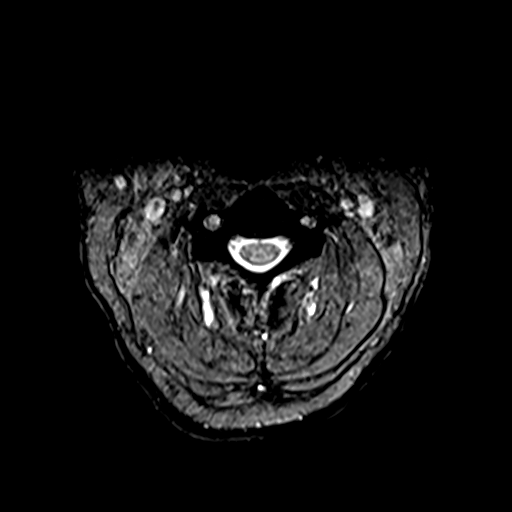
[im 29/29]
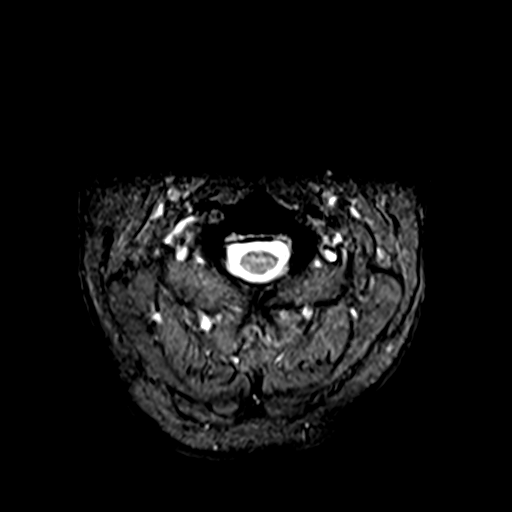

[36 of 48 positions shown; findings below may reference images not displayed]

FINDINGS: Alignment: Straightening with mild reversal of the normal cervical
lordosis. No listhesis.

Vertebrae: Vertebral body height maintained without acute or chronic
fracture. Bone marrow signal intensity within normal limits. 1 cm
atypical hemangioma noted within the C6 vertebral body. No worrisome
osseous lesions. No abnormal marrow edema.

Cord: Normal signal and morphology.

Posterior Fossa, vertebral arteries, paraspinal tissues: Visualized
brain and posterior fossa within normal limits. Craniocervical
junction normal. Paraspinous and prevertebral soft tissues within
normal limits. Normal intravascular flow voids seen within the
vertebral arteries bilaterally.

Disc levels:

C2-C3: Unremarkable.

C3-C4: Minimal disc bulge with uncovertebral spurring. No
significant canal or foraminal stenosis.

C4-C5: Mild disc bulge with bilateral uncovertebral hypertrophy.
Flattening and partial effacement of the ventral thecal sac with
resultant mild to moderate spinal stenosis. Mild to moderate left
with mild right C5 foraminal narrowing.

C5-C6: Disc bulge with uncovertebral hypertrophy. Bulging disc
flattens and partially effaces the ventral thecal sac, slightly
worse on the left. Mild cord flattening without cord signal changes.
Moderate spinal stenosis. Moderate left worse than right C6
foraminal narrowing.

C6-C7: Left paracentral disc protrusion indents the left ventral
thecal sac, contacting the left ventral cord (series 9, image 22).
Mild cord flattening without cord signal changes. Mild to moderate
spinal stenosis. Superimposed uncovertebral spurring with resultant
mild to moderate left C7 foraminal stenosis. Right neural foramen
remains patent.

C7-T1: Negative interspace. Mild facet hypertrophy. No canal or
foraminal stenosis.

Visualized upper thoracic spine demonstrates no significant finding.
IMPRESSION: 1. Left paracentral disc protrusion at C6-7 contacting and mildly
flattening the left hemi cord with resultant mild to moderate spinal
stenosis. Finding could contribute to left-sided symptoms.
2. Disc bulge with uncovertebral hypertrophy at C5-6 with resultant
moderate spinal stenosis, with moderate left worse than right C6
foraminal narrowing.
3. Disc bulge with uncovertebral hypertrophy at C4-5 with resultant
mild to moderate spinal stenosis, with mild to moderate left greater
than right C5 foraminal narrowing.

## 2023-02-19 ENCOUNTER — Other Ambulatory Visit: Payer: Self-pay | Admitting: Nurse Practitioner

## 2023-02-19 DIAGNOSIS — I255 Ischemic cardiomyopathy: Secondary | ICD-10-CM

## 2023-07-08 ENCOUNTER — Emergency Department
Admission: EM | Admit: 2023-07-08 | Discharge: 2023-07-08 | Disposition: A | Discharge status: Home / Self Care | Payer: Self-pay | Attending: Emergency Medicine | Admitting: Emergency Medicine

## 2023-07-08 ENCOUNTER — Other Ambulatory Visit: Payer: Self-pay

## 2023-07-08 ENCOUNTER — Encounter: Payer: Self-pay | Admitting: Emergency Medicine

## 2023-07-08 ENCOUNTER — Emergency Department: Payer: Self-pay

## 2023-07-08 DIAGNOSIS — M542 Cervicalgia: Secondary | ICD-10-CM | POA: Insufficient documentation

## 2023-07-08 LAB — CBC WITH DIFFERENTIAL/PLATELET
Abs Immature Granulocytes: 0.05 10*3/uL (ref 0.00–0.07)
Basophils Absolute: 0.1 10*3/uL (ref 0.0–0.1)
Basophils Relative: 1 %
Eosinophils Absolute: 0.3 10*3/uL (ref 0.0–0.5)
Eosinophils Relative: 2 %
HCT: 47.9 % (ref 39.0–52.0)
Hemoglobin: 15.9 g/dL (ref 13.0–17.0)
Immature Granulocytes: 0 %
Lymphocytes Relative: 29 %
Lymphs Abs: 3.6 10*3/uL (ref 0.7–4.0)
MCH: 30.5 pg (ref 26.0–34.0)
MCHC: 33.2 g/dL (ref 30.0–36.0)
MCV: 91.9 fL (ref 80.0–100.0)
Monocytes Absolute: 1.1 10*3/uL — ABNORMAL HIGH (ref 0.1–1.0)
Monocytes Relative: 9 %
Neutro Abs: 7.3 10*3/uL (ref 1.7–7.7)
Neutrophils Relative %: 59 %
Platelets: 280 10*3/uL (ref 150–400)
RBC: 5.21 MIL/uL (ref 4.22–5.81)
RDW: 13.1 % (ref 11.5–15.5)
WBC: 12.4 10*3/uL — ABNORMAL HIGH (ref 4.0–10.5)
nRBC: 0 % (ref 0.0–0.2)

## 2023-07-08 LAB — BASIC METABOLIC PANEL
Anion gap: 10 (ref 5–15)
BUN: 15 mg/dL (ref 6–20)
CO2: 24 mmol/L (ref 22–32)
Calcium: 9.3 mg/dL (ref 8.9–10.3)
Chloride: 105 mmol/L (ref 98–111)
Creatinine, Ser: 0.89 mg/dL (ref 0.61–1.24)
GFR, Estimated: >60 mL/min (ref >60)
Glucose, Bld: 106 mg/dL — ABNORMAL HIGH (ref 70–99)
Potassium: 3.9 mmol/L (ref 3.5–5.1)
Sodium: 139 mmol/L (ref 135–145)

## 2023-07-08 LAB — BRAIN NATRIURETIC PEPTIDE: B Natriuretic Peptide: 65.1 pg/mL (ref 0.0–100.0)

## 2023-07-08 MED ORDER — MUSCLE RUB 10-15 % EX CREA: 1.0000 | TOPICAL_CREAM | CUTANEOUS | 0 refills | Status: DC | PRN

## 2023-07-08 MED ORDER — OXYCODONE HCL 5 MG PO TABS
5.0000 mg | ORAL_TABLET | Freq: Once | ORAL | Status: AC
  Administered 2023-07-08: 5 mg via ORAL
  Filled 2023-07-08: qty 1

## 2023-07-08 MED ORDER — LIDOCAINE 5 % EX PTCH
1.0000 | MEDICATED_PATCH | CUTANEOUS | Status: DC
  Administered 2023-07-08: 1 via TRANSDERMAL
  Filled 2023-07-08: qty 1

## 2023-07-08 MED ORDER — LIDOCAINE 5 % EX PTCH: 1.0000 | MEDICATED_PATCH | Freq: Two times a day (BID) | CUTANEOUS | 0 refills | Status: DC

## 2023-07-08 MED ORDER — OXYCODONE HCL 5 MG PO TABS: 5.0000 mg | ORAL_TABLET | Freq: Three times a day (TID) | ORAL | 0 refills | Status: DC | PRN

## 2023-07-08 MED ORDER — DEXAMETHASONE SODIUM PHOSPHATE 10 MG/ML IJ SOLN
10.0000 mg | Freq: Once | INTRAMUSCULAR | Status: AC
  Administered 2023-07-08: 10 mg via INTRAMUSCULAR
  Filled 2023-07-08: qty 1

## 2023-07-08 MED ORDER — ACETAMINOPHEN 500 MG PO TABS
1000.0000 mg | ORAL_TABLET | Freq: Once | ORAL | Status: AC
Start: 2023-07-08 — End: 2023-07-08
  Administered 2023-07-08: 1000 mg via ORAL
  Filled 2023-07-08: qty 2

## 2023-07-08 NOTE — ED Triage Notes (Addendum)
Pt reports neck pain in the center of his neck that began at rest last night.  Feels hot and sweaty and shob when the neck pain occurs. Pt reports recent epidural neck injection.  Hx of neck problems. Took a nitroglycerin at home

## 2023-07-08 NOTE — ED Provider Triage Note (Signed)
Emergency Medicine Provider Triage Evaluation Note  Kenneth Medina , a 52 y.o. male  was evaluated in triage.  Pt complains of neck pain that began all of a sudden last night when patient was at rest. Patient describes zoning out when it happens. Also having hot flashes and SOB. States SOB was bad enough earlier today that he took a nitroglycerin. Has a history of neck pain and has had epidural injections.   Review of Systems  Positive: Neck pain, SOB,  Negative: CP  Physical Exam  There were no vitals taken for this visit. Gen:   Awake, no distress   Resp:  Normal effort  MSK:   Moves extremities without difficulty  Other: Some limited ROM due to pain in neck extension, rotation to the right, lateral flexion to the left  Medical Decision Making  Medically screening exam initiated at 5:09 PM.  Appropriate orders placed.  Kenneth Medina was informed that the remainder of the evaluation will be completed by another provider, this initial triage assessment does not replace that evaluation, and the importance of remaining in the ED until their evaluation is complete.     Kenneth Ali, PA-C 07/08/23 1717

## 2023-07-08 NOTE — Discharge Instructions (Signed)
Take Tylenol 650 mg every 6 hours for pain. Use muscle rub daily as prescribed. Take oxycodone for severe/breakthrough pain as prescribed.  Call Dr. Osborne Oman office for an appointment next week.  Thank you for choosing [place] for your health care today!  Please see your primary doctor this week for a follow up appointment.   If you have any new, worsening, or unexpected symptoms call your doctor right away or come back to the emergency department for reevaluation.  It was my pleasure to care for you today.   Daneil Dan Modesto Charon, MD

## 2023-07-08 NOTE — ED Provider Notes (Signed)
[facility] Provider Note    Event Date/Time   First MD Initiated Contact with Patient 07/08/23 2051     (approximate)   History   Neck Pain and Shortness of Breath   HPI  Kenneth Medina is a 52 y.o. male   Past medical history of longstanding history of degenerative disc disease with neck pain seeing spinal clinic, got an injection in June.  His pain was exacerbated yesterday while seated.  No new trauma.  No new paresthesias or focal neurologic changes.  Pain is in the same area and same quality as pain exacerbations in the past.  When the pain was very severe, he felt short of breath, felt a flushing hot sensation throughout his body.  Those associated symptoms have now completely resolved after his pain has subsided a little bit.  No other acute medical complaints.   External Medical Documents Reviewed: MRI of the cervical spine obtained in June 2023 showing disc protrusion at the C6-7 area and disc bulge at the C5-6 and C4-5 areas      Physical Exam   Triage Vital Signs: ED Triage Vitals [07/08/23 1711]  Encounter Vitals Group     BP (!) 147/90     Systolic BP Percentile      Diastolic BP Percentile      Pulse Rate 71     Resp 18     Temp 98.1 F (36.7 C)     Temp Source Oral     SpO2 97 %     Weight      Height      Head Circumference      Peak Flow      Pain Score 6     Pain Loc      Pain Education      Exclude from Growth Chart     Most recent vital signs: Vitals:   07/08/23 2023 07/08/23 2246  BP: 129/75   Pulse: 63   Resp: 16 16  Temp: 98 F (36.7 C)   SpO2: 95%     General: Awake, no distress.  CV:  Good peripheral perfusion.  Resp:  Normal effort. Abd:  No distention.  Other:  Neuro intact to bilateral upper extremities.  Able to range his neck fully.  Midline tenderness to the C-spine without deformities or step-off   ED Results / Procedures / Treatments   Labs (all labs ordered are listed, but only  abnormal results are displayed) Labs Reviewed  CBC WITH DIFFERENTIAL/PLATELET - Abnormal; Notable for the following components:      Result Value   WBC 12.4 (*)    Monocytes Absolute 1.1 (*)    All other components within normal limits  BASIC METABOLIC PANEL - Abnormal; Notable for the following components:   Glucose, Bld 106 (*)    All other components within normal limits  BRAIN NATRIURETIC PEPTIDE     I ordered and reviewed the above labs they are notable for white blood cell count slightly elevated 12.4.  EKG  ED ECG REPORT I, Pilar Jarvis, the attending physician, personally viewed and interpreted this ECG.   Date: 07/08/2023  EKG Time: 1719  Rate: 64  Rhythm: sinus  Axis: nl  Intervals:none  ST&T Change: no stemi    Radiology-I independently interpreted the results of his chest x-ray and see no obvious focality pneumothorax   PROCEDURES:  Critical Care performed: No  Procedures   MEDICATIONS ORDERED IN ED: Medications  lidocaine (LIDODERM) 5 %  1 patch (1 patch Transdermal Patch Applied 07/08/23 2229)  dexamethasone (DECADRON) injection 10 mg (10 mg Intramuscular Given 07/08/23 2230)  acetaminophen (TYLENOL) tablet 1,000 mg (1,000 mg Oral Given 07/08/23 2229)  oxyCODONE (Oxy IR/ROXICODONE) immediate release tablet 5 mg (5 mg Oral Given 07/08/23 2229)     IMPRESSION / MDM / ASSESSMENT AND PLAN / ED COURSE  I reviewed the triage vital signs and the nursing notes.                                Patient's presentation is most consistent with acute presentation with potential threat to life or bodily function.  Differential diagnosis includes, but is not limited to exacerbation of chronic degenerative disc cervical spine most likely, considered but less likely C-spine fracture, dislocation, cord compression, epidural abscess or hematoma   The patient is on the cardiac monitor to evaluate for evidence of arrhythmia and/or significant heart rate  changes.  MDM:    Acute exacerbation of chronic neck pain but I doubt fractures, dislocations, given there is no new trauma.  No new neurologic symptoms to suggest cord compression.  Focus on therapeutics today with multimodal pain control and he will discharge today with follow-up in spine clinic.       FINAL CLINICAL IMPRESSION(S) / ED DIAGNOSES   Final diagnoses:  Neck pain     Rx / DC Orders   ED Discharge Orders          Ordered    oxyCODONE (ROXICODONE) 5 MG immediate release tablet  Every 8 hours PRN        07/08/23 2224    lidocaine (LIDODERM) 5 %  Every 12 hours        07/08/23 2224    Menthol-Methyl Salicylate (MUSCLE RUB) 10-15 % CREA  As needed        07/08/23 2224             Note:  This document was prepared using Dragon voice recognition software and may include unintentional dictation errors.    Pilar Jarvis, MD 07/09/23 [phone]

## 2023-07-19 NOTE — Progress Notes (Deleted)
Referring Physician:  No referring provider defined for this encounter.  Primary Physician:  Pcp, No  History of Present Illness: 07/19/2023*** [name] has a history of CAD, ischemic cardiomyopathy, HTN, sleep apnea, history of MI, and hyperlipidemia.   He was seen in ED on 07/08/23 with chronic neck pain that got severe a day prior.   He saw Dr. Myer Haff on 02/25/22 with known left paracentral disc at C6-C7 with mild/moderate central stenosis and moderate central stenosis C5-C6 with moderate left > right foraminal stenosis.   He had left C6-C7 TF ESI with Dr. Yves Dill on 03/18/22   Given oxycodone and muscle rub from ED.     Duration: *** Location: *** Quality: *** Severity: ***  Precipitating: aggravated by *** Modifying factors: made better by *** Weakness: none Timing: *** Bowel/Bladder Dysfunction: none  Conservative measures:  Physical therapy: none  Multimodal medical therapy including regular antiinflammatories: prednisone  Injections:  left C6-C7 TF ESI with Dr. Yves Dill on 03/18/22  Past Surgery: ***  Kenneth Medina has ***no symptoms of cervical myelopathy.  The symptoms are causing a significant impact on the patient's life.   Review of Systems:  A 10 point review of systems is negative, except for the pertinent positives and negatives detailed in the HPI.  Past Medical History: Past Medical History:  Diagnosis Date   Coronary artery disease    a. s/p ant STEMI 5/15 >> LHC (5/15):  prox D1 30-40%, LAD occluded, prox PDA 20-30%, mid to dist Ant HK, EF 45% >>> PCI:  2.75x22 mm Resolute DES to mid LAD   Hyperlipidemia    Hypertension    Ischemic cardiomyopathy    a. EF 45% at [facility] at time of MI >> b. Echo (5/15):  Apical HK, no clot, EF 50%, mod LVH, normal RVF   MI (myocardial infarction) (HCC)    2015   Tobacco abuse     Past Surgical History: Past Surgical History:  Procedure Laterality Date   LEFT HEART CATHETERIZATION WITH CORONARY  ANGIOGRAM N/A 01/23/2014   Procedure: LEFT HEART CATHETERIZATION WITH CORONARY ANGIOGRAM;  Surgeon: Micheline Chapman, MD;  Location: [facility] CATH LAB;  Service: Cardiovascular;  Laterality: N/A;    Allergies: Allergies as of 07/22/2023 - Review Complete 07/08/2023  Allergen Reaction Noted   Ibuprofen Hives 01/23/2014    Medications: Outpatient Encounter Medications as of 07/22/2023  Medication Sig   aspirin 81 MG chewable tablet Chew 1 tablet (81 mg total) by mouth daily.   atorvastatin (LIPITOR) 80 MG tablet Take 1 tablet (80 mg total) by mouth daily at 6 PM. Please call [phone] to schedule an appointment for future refills. Thank you. 1st attempt.   carvedilol (COREG) 6.25 MG tablet Take 1 tablet (6.25 mg total) by mouth 2 (two) times daily with a meal. Please call [phone] to schedule an appointment for future refills. Thank you. 1st attempt.   ezetimibe (ZETIA) 10 MG tablet Take 1 tablet (10 mg total) by mouth daily. Please call [phone] to schedule an appointment for future refills. Thank you. 1st attempt.   lidocaine (LIDODERM) 5 % Place 1 patch onto the skin every 12 (twelve) hours. Remove & Discard patch within 12 hours or as directed by MD   lisinopril (ZESTRIL) 2.5 MG tablet Take 1 tablet (2.5 mg total) by mouth daily. Please call [phone] to schedule an appointment for future refills. Thank you. 1st attempt.   Menthol-Methyl Salicylate (MUSCLE RUB) 10-15 % CREA Apply 1 Application topically as needed.  nicotine (NICODERM CQ - DOSED IN MG/24 HOURS) 14 mg/24hr patch Place 1 patch (14 mg total) onto the skin daily. FOR 30 DAYS   nicotine (NICODERM CQ - DOSED IN MG/24 HOURS) 21 mg/24hr patch Place 1 patch (21 mg total) onto the skin daily. FOR 30 DAYS   nicotine (NICODERM CQ) 7 mg/24hr patch Place 1 patch (7 mg total) onto the skin daily. FOR 30 DAYS   nitroGLYCERIN (NITROSTAT) 0.4 MG SL tablet Place 1 tablet (0.4 mg total) under the tongue every 5 (five) minutes x 3 doses as  needed for chest pain.   ondansetron (ZOFRAN ODT) 4 MG disintegrating tablet Take 1 tablet (4 mg total) by mouth every 8 (eight) hours as needed for nausea or vomiting.   oxyCODONE (ROXICODONE) 5 MG immediate release tablet Take 1 tablet (5 mg total) by mouth every 8 (eight) hours as needed for up to 12 doses.   No facility-administered encounter medications on file as of 07/22/2023.    Social History: Social History   Tobacco Use   Smoking status: Every Day    Current packs/day: 0.50    Average packs/day: 0.5 packs/day for 30.0 years (15.0 ttl pk-yrs)    Types: Cigarettes   Smokeless tobacco: Never  Vaping Use   Vaping status: Never Used  Substance Use Topics   Alcohol use: No   Drug use: No    Family Medical History: Family History  Problem Relation Age of Onset   Hypertension Mother    Cancer Maternal Grandmother    Cancer Maternal Grandfather    Heart attack Neg Hx    Stroke Neg Hx     Physical Examination: There were no vitals filed for this visit.  General: Patient is well developed, well nourished, calm, collected, and in no apparent distress. Attention to examination is appropriate.  Respiratory: Patient is breathing without any difficulty.   NEUROLOGICAL:     Awake, alert, oriented to person, place, and time.  Speech is clear and fluent. Fund of knowledge is appropriate.   Cranial Nerves: Pupils equal round and reactive to light.  Facial tone is symmetric.    *** ROM of cervical spine *** pain *** posterior cervical tenderness. *** tenderness in bilateral trapezial region.   *** ROM of lumbar spine *** pain *** posterior lumbar tenderness.   No abnormal lesions on exposed skin.   Strength: Side Biceps Triceps Deltoid Interossei Grip Wrist Ext. Wrist Flex.  R 5 5 5 5 5 5 5   L 5 5 5 5 5 5 5    Side Iliopsoas Quads Hamstring PF DF EHL  R 5 5 5 5 5 5   L 5 5 5 5 5 5    Reflexes are ***2+ and symmetric at the biceps, brachioradialis, patella and  achilles.   Hoffman's is absent.  Clonus is not present.   Bilateral upper and lower extremity sensation is intact to light touch.     Gait is normal.   ***No difficulty with tandem gait.    Medical Decision Making  Imaging: No new imaging. Last cervical MRI 02/24/22. ***  Assessment and Plan: Kenneth Medina is a pleasant 52 y.o. male has ***  Treatment options discussed with patient and following plan made:   - Order for physical therapy for *** spine ***. Patient to call to schedule appointment. *** - Continue current medications including ***. Reviewed dosing and side effects.  - Prescription for ***. Reviewed dosing and side effects. Take with food.  - Prescription for *** to  take prn muscle spasms. Reviewed dosing and side effects. Discussed this can cause drowsiness.  - MRI of *** to further evaluate *** radiculopathy. No improvement time or medications (***).  - Referral to PMR at [facility] to discuss possible *** injections.  - Will schedule phone visit to review MRI results once I get them back.   I spent a total of *** minutes in face-to-face and non-face-to-face activities related to this patient's care today including review of outside records, review of imaging, review of symptoms, physical exam, discussion of differential diagnosis, discussion of treatment options, and documentation.   Thank you for involving me in the care of this patient.   Drake Leach PA-C Dept. of Neurosurgery

## 2023-07-22 ENCOUNTER — Ambulatory Visit: Payer: Self-pay | Admitting: Orthopedic Surgery

## 2023-08-18 ENCOUNTER — Encounter: Payer: Self-pay | Admitting: *Deleted

## 2023-08-18 ENCOUNTER — Emergency Department: Payer: Self-pay

## 2023-08-18 ENCOUNTER — Other Ambulatory Visit: Payer: Self-pay

## 2023-08-18 DIAGNOSIS — R200 Anesthesia of skin: Secondary | ICD-10-CM | POA: Insufficient documentation

## 2023-08-18 DIAGNOSIS — R0602 Shortness of breath: Secondary | ICD-10-CM | POA: Insufficient documentation

## 2023-08-18 DIAGNOSIS — Z5321 Procedure and treatment not carried out due to patient leaving prior to being seen by health care provider: Secondary | ICD-10-CM | POA: Insufficient documentation

## 2023-08-18 DIAGNOSIS — R079 Chest pain, unspecified: Secondary | ICD-10-CM | POA: Insufficient documentation

## 2023-08-18 LAB — BASIC METABOLIC PANEL
Anion gap: 14 (ref 5–15)
BUN: 11 mg/dL (ref 6–20)
CO2: 23 mmol/L (ref 22–32)
Calcium: 9 mg/dL (ref 8.9–10.3)
Chloride: 105 mmol/L (ref 98–111)
Creatinine, Ser: 0.94 mg/dL (ref 0.61–1.24)
GFR, Estimated: >60 mL/min (ref >60)
Glucose, Bld: 110 mg/dL — ABNORMAL HIGH (ref 70–99)
Potassium: 4.4 mmol/L (ref 3.5–5.1)
Sodium: 142 mmol/L (ref 135–145)

## 2023-08-18 LAB — CBC
HCT: 45 % (ref 39.0–52.0)
Hemoglobin: 15.2 g/dL (ref 13.0–17.0)
MCH: 30.8 pg (ref 26.0–34.0)
MCHC: 33.8 g/dL (ref 30.0–36.0)
MCV: 91.3 fL (ref 80.0–100.0)
Platelets: 247 10*3/uL (ref 150–400)
RBC: 4.93 MIL/uL (ref 4.22–5.81)
RDW: 13.1 % (ref 11.5–15.5)
WBC: 9.9 10*3/uL (ref 4.0–10.5)
nRBC: 0 % (ref 0.0–0.2)

## 2023-08-18 LAB — PROTIME-INR
INR: 1 (ref 0.8–1.2)
Prothrombin Time: 12.8 s (ref 11.4–15.2)

## 2023-08-18 LAB — TROPONIN I (HIGH SENSITIVITY): Troponin I (High Sensitivity): 19 ng/L — ABNORMAL HIGH (ref <18)

## 2023-08-18 NOTE — ED Notes (Signed)
No answer when called several times from lobby 

## 2023-08-18 NOTE — ED Triage Notes (Signed)
Pt to triage via wheelchair.  Pt reports chest pain for 1 hour.  Pt took 2 ntg at home with some relief.  Pt also has sob.  Pt reports numbness in left arm.  Pt alert  speech clear.

## 2023-08-19 ENCOUNTER — Emergency Department
Admission: EM | Admit: 2023-08-19 | Discharge: 2023-08-19 | Discharge status: Against Medical Advice | Payer: Self-pay | Attending: Emergency Medicine | Admitting: Emergency Medicine

## 2023-08-19 NOTE — ED Notes (Signed)
No answer when called several times from lobby 

## 2023-08-20 ENCOUNTER — Telehealth: Payer: Self-pay | Admitting: Emergency Medicine

## 2023-08-20 NOTE — Telephone Encounter (Signed)
Called patient due to left emergency department before provider exam to inquire about condition and follow up plans. Left message. 

## 2023-10-14 ENCOUNTER — Encounter: Payer: Self-pay | Admitting: Cardiovascular Disease

## 2023-10-14 ENCOUNTER — Ambulatory Visit: Payer: Self-pay | Attending: Cardiovascular Disease | Admitting: Cardiovascular Disease

## 2023-10-14 VITALS — BP 120/60 | HR 76 | Ht 69.0 in | Wt 176.8 lb

## 2023-10-14 DIAGNOSIS — I10 Essential (primary) hypertension: Secondary | ICD-10-CM

## 2023-10-14 DIAGNOSIS — E785 Hyperlipidemia, unspecified: Secondary | ICD-10-CM

## 2023-10-14 DIAGNOSIS — I2510 Atherosclerotic heart disease of native coronary artery without angina pectoris: Secondary | ICD-10-CM

## 2023-10-14 DIAGNOSIS — I255 Ischemic cardiomyopathy: Secondary | ICD-10-CM

## 2023-10-14 DIAGNOSIS — Z720 Tobacco use: Secondary | ICD-10-CM

## 2023-10-14 MED ORDER — LISINOPRIL 2.5 MG PO TABS: 2.5000 mg | ORAL_TABLET | Freq: Every day | ORAL | 11 refills | Status: DC

## 2023-10-14 MED ORDER — ATORVASTATIN CALCIUM 80 MG PO TABS: 80.0000 mg | ORAL_TABLET | Freq: Every day | ORAL | 11 refills | Status: DC

## 2023-10-14 MED ORDER — EZETIMIBE 10 MG PO TABS: 10.0000 mg | ORAL_TABLET | Freq: Every day | ORAL | 11 refills | Status: DC

## 2023-10-14 MED ORDER — NITROGLYCERIN 0.4 MG SL SUBL: 0.4000 mg | SUBLINGUAL_TABLET | SUBLINGUAL | 5 refills | Status: AC | PRN

## 2023-10-14 MED ORDER — CARVEDILOL 6.25 MG PO TABS: 6.2500 mg | ORAL_TABLET | Freq: Two times a day (BID) | ORAL | 11 refills | Status: DC

## 2023-10-14 NOTE — Assessment & Plan Note (Signed)
Blood pressure controlled on carvedilol and lisinopril

## 2023-10-14 NOTE — H&P (View-Only) (Signed)
 Cardiology Office Note:    Date:  10/14/2023   ID:  Kenneth Medina, DOB 05-21-1971, MRN [id number]  PCP:  Aviva Kluver   [facility] Providers Cardiologist:  Tonny Bollman, MD     Referring MD: No ref. provider found   Chief Complaint  Patient presents with   Chest Pain    History of Present Illness:    Kenneth Medina is a 53 y.o. male with a hx of coronary artery disease, presenting for follow-up evaluation.  The patient initially presented with an anterior STEMI in 2015 and was treated with primary PCI of the LAD.  His post MI LVEF was 50%.  I have not seen him since 2018 but he has been following with our APP's here, last visit in 2023.  The patient is here alone today. He was seen at [facility] for chest pain in December 2024 when he was noted to have a nonischemic EKG and a high-sensitivity troponin mildly elevated at 19.  He waited in the lobby of the ER for about 4 hours and left after his chest pain eased up.  He reports fairly prolonged episode of discomfort that day and states that it felt pretty similar to his previous heart attack.  Since that time, he has had frequent symptoms of angina.  He describes substernal chest discomfort with low-level activity, easing off with rest.  He also complains of shortness of breath and orthopnea.  No edema, lightheadedness, or syncope.  He has experienced some episodes where he feels like his heart is beating fast.  He has not had resting chest discomfort over the past week.  However, he continues to feel a pressure-like sensation in his chest with any mild or moderate activity.  Current Medications: Current Meds  Medication Sig   aspirin 81 MG chewable tablet Chew 1 tablet (81 mg total) by mouth daily.   atorvastatin (LIPITOR) 80 MG tablet Take 1 tablet (80 mg total) by mouth daily at 6 PM. Please call [phone] to schedule an appointment for future refills. Thank you. 1st attempt.   carvedilol (COREG) 6.25 MG tablet Take 1 tablet (6.25  mg total) by mouth 2 (two) times daily with a meal. Please call [phone] to schedule an appointment for future refills. Thank you. 1st attempt.   ezetimibe (ZETIA) 10 MG tablet Take 1 tablet (10 mg total) by mouth daily. Please call [phone] to schedule an appointment for future refills. Thank you. 1st attempt.   lidocaine (LIDODERM) 5 % Place 1 patch onto the skin every 12 (twelve) hours. Remove & Discard patch within 12 hours or as directed by MD   lisinopril (ZESTRIL) 2.5 MG tablet Take 1 tablet (2.5 mg total) by mouth daily. Please call [phone] to schedule an appointment for future refills. Thank you. 1st attempt.   nitroGLYCERIN (NITROSTAT) 0.4 MG SL tablet Place 1 tablet (0.4 mg total) under the tongue every 5 (five) minutes x 3 doses as needed for chest pain.   [DISCONTINUED] atorvastatin (LIPITOR) 80 MG tablet Take 1 tablet (80 mg total) by mouth daily at 6 PM. Please call [phone] to schedule an appointment for future refills. Thank you. 1st attempt.   [DISCONTINUED] carvedilol (COREG) 6.25 MG tablet Take 1 tablet (6.25 mg total) by mouth 2 (two) times daily with a meal. Please call [phone] to schedule an appointment for future refills. Thank you. 1st attempt.   [DISCONTINUED] ezetimibe (ZETIA) 10 MG tablet Take 1 tablet (10 mg total) by mouth daily. Please call [phone] to schedule  an appointment for future refills. Thank you. 1st attempt.   [DISCONTINUED] lisinopril (ZESTRIL) 2.5 MG tablet Take 1 tablet (2.5 mg total) by mouth daily. Please call [phone] to schedule an appointment for future refills. Thank you. 1st attempt.   [DISCONTINUED] Menthol-Methyl Salicylate (MUSCLE RUB) 10-15 % CREA Apply 1 Application topically as needed.   [DISCONTINUED] nicotine (NICODERM CQ - DOSED IN MG/24 HOURS) 14 mg/24hr patch Place 1 patch (14 mg total) onto the skin daily. FOR 30 DAYS   [DISCONTINUED] nicotine (NICODERM CQ - DOSED IN MG/24 HOURS) 21 mg/24hr patch Place 1 patch  (21 mg total) onto the skin daily. FOR 30 DAYS   [DISCONTINUED] nicotine (NICODERM CQ) 7 mg/24hr patch Place 1 patch (7 mg total) onto the skin daily. FOR 30 DAYS   [DISCONTINUED] nitroGLYCERIN (NITROSTAT) 0.4 MG SL tablet Place 1 tablet (0.4 mg total) under the tongue every 5 (five) minutes x 3 doses as needed for chest pain.   [DISCONTINUED] ondansetron (ZOFRAN ODT) 4 MG disintegrating tablet Take 1 tablet (4 mg total) by mouth every 8 (eight) hours as needed for nausea or vomiting.   [DISCONTINUED] oxyCODONE (ROXICODONE) 5 MG immediate release tablet Take 1 tablet (5 mg total) by mouth every 8 (eight) hours as needed for up to 12 doses.     Allergies:   Ibuprofen   ROS:   Please see the history of present illness.    All other systems reviewed and are negative.  EKGs/Labs/Other Studies Reviewed:    The following studies were reviewed today: Cardiac Studies & Procedures     STRESS TESTS  MYOCARDIAL PERFUSION IMAGING 09/21/2019  Narrative  The left ventricular ejection fraction is normal (55-65%).  Nuclear stress EF: 57%. No wall motion abnormalities  There was no ST segment deviation noted during stress.  The study is normal.  This is a low risk study. No ischemia identified  Donato Schultz, MD  ECHOCARDIOGRAM  ECHOCARDIOGRAM COMPLETE 02/16/2022  Narrative ECHOCARDIOGRAM REPORT    Patient Name:   Kenneth Medina Date of Exam: 02/16/2022 Medical Rec #:  [id number]      Height:       69.0 in Accession #:    [id number]     Weight:       180.0 lb Date of Birth:  1970-10-11       BSA:          1.976 m Patient Age:    53 years       BP:           100/60 mmHg Patient Gender: M              HR:           66 bpm. Exam Location:  [address]  Procedure: 2D Echo, 3D Echo, Cardiac Doppler, Color Doppler and Strain Analysis  Indications:    R06.00 Dyspnea  History:        Patient has prior history of Echocardiogram examinations, most recent 03/12/2016. CAD and Previous Myocardial  Infarction, Signs/Symptoms:Shortness of Breath, Dizziness/Lightheadedness, Fatigue and Chest Pain; Risk Factors:Hypertension, Dyslipidemia and Current Smoker. Palpitations, Ischemic Cardiomyopathy.  Sonographer:    Farrel Conners RDCS Referring Phys: Zachary George SWINYER  IMPRESSIONS   1. Left ventricular ejection fraction, by estimation, is 65 to 70%. Left ventricular ejection fraction by 3D volume is 67 %. The left ventricle has normal function. The left ventricle has no regional wall motion abnormalities. Left ventricular diastolic parameters were normal. The average left ventricular global longitudinal strain is  22.6 %. The global longitudinal strain is normal. 2. Right ventricular systolic function is normal. The right ventricular size is normal. There is normal pulmonary artery systolic pressure. 3. The mitral valve is normal in structure. No evidence of mitral valve regurgitation. 4. The aortic valve is normal in structure. Aortic valve regurgitation is not visualized.  FINDINGS Left Ventricle: Left ventricular ejection fraction, by estimation, is 65 to 70%. Left ventricular ejection fraction by 3D volume is 67 %. The left ventricle has normal function. The left ventricle has no regional wall motion abnormalities. The average left ventricular global longitudinal strain is 22.6 %. The global longitudinal strain is normal. The left ventricular internal cavity size was normal in size. There is no left ventricular hypertrophy. Left ventricular diastolic parameters were normal.  Right Ventricle: The right ventricular size is normal. Right vetricular wall thickness was not well visualized. Right ventricular systolic function is normal. There is normal pulmonary artery systolic pressure. The tricuspid regurgitant velocity is 2.23 m/s, and with an assumed right atrial pressure of 3 mmHg, the estimated right ventricular systolic pressure is 22.9 mmHg.  Left Atrium: Left atrial size was normal  in size.  Right Atrium: Right atrial size was normal in size.  Pericardium: There is no evidence of pericardial effusion.  Mitral Valve: The mitral valve is normal in structure. No evidence of mitral valve regurgitation.  Tricuspid Valve: The tricuspid valve is normal in structure. Tricuspid valve regurgitation is not demonstrated.  Aortic Valve: The aortic valve is normal in structure. Aortic valve regurgitation is not visualized.  Pulmonic Valve: The pulmonic valve was normal in structure. Pulmonic valve regurgitation is not visualized.  Aorta: The aortic root and ascending aorta are structurally normal, with no evidence of dilitation.  IAS/Shunts: No atrial level shunt detected by color flow Doppler.   LEFT VENTRICLE PLAX 2D LVIDd:         4.80 cm         Diastology LVIDs:         2.75 cm         LV e' medial:    10.30 cm/s LV PW:         0.90 cm         LV E/e' medial:  6.7 LV IVS:        0.85 cm         LV e' lateral:   12.10 cm/s LVOT diam:     2.30 cm         LV E/e' lateral: 5.7 LV SV:         86 LV SV Index:   44              2D LVOT Area:     4.15 cm        Longitudinal Strain 2D Strain GLS  25.2 % (A2C): 2D Strain GLS  21.7 % (A3C): 2D Strain GLS  20.9 % (A4C): 2D Strain GLS  22.6 % Avg:  3D Volume EF LV 3D EF:    Left ventricul ar ejection fraction by 3D volume is 67 %.  3D Volume EF: 3D EF:        67 % LV EDV:       121 ml LV ESV:       40 ml LV SV:        81 ml  RIGHT VENTRICLE RV Basal diam:  4.40 cm RV Mid diam:    4.10 cm RV S prime:  10.90 cm/s TAPSE (M-mode): 2.5 cm  LEFT ATRIUM             Index        RIGHT ATRIUM           Index LA diam:        3.60 cm 1.82 cm/m   RA Area:     22.90 cm LA Vol (A2C):   62.4 ml 31.59 ml/m  RA Volume:   78.90 ml  39.94 ml/m LA Vol (A4C):   52.5 ml 26.58 ml/m LA Biplane Vol: 57.2 ml 28.95 ml/m AORTIC VALVE LVOT Vmax:   103.00 cm/s LVOT Vmean:  61.550 cm/s LVOT VTI:    0.207  m  AORTA Ao Root diam: 3.40 cm Ao Asc diam:  3.30 cm  MITRAL VALVE               TRICUSPID VALVE MV Area (PHT)  cm         TR Peak grad:   19.9 mmHg MV Decel Time: 205 msec    TR Vmax:        223.00 cm/s MV E velocity: 69.15 cm/s MV A velocity: 63.40 cm/s  SHUNTS MV E/A ratio:  1.09        Systemic VTI:  0.21 m Systemic Diam: 2.30 cm  Kristeen Miss MD Electronically signed by Kristeen Miss MD Signature Date/Time: 02/16/2022/11:50:29 AM    Final   MONITORS  LONG TERM MONITOR (3-14 DAYS) 02/16/2022  Narrative Patch Wear Time:  10 days and 12 hours (2023-05-16T22:09:52-0400 to 2023-05-27T11:00:22-0400)  Patient had a min HR of 45 bpm, max HR of 122 bpm, and avg HR of 73 bpm. Predominant underlying rhythm was Sinus Rhythm. Isolated SVEs were rare (<1.0%), SVE Couplets were rare (<1.0%), and no SVE Triplets were present. Isolated VEs were rare (<1.0%), and no VE Couplets or VE Triplets were present.  1. The basic rhythm is normal sinus with an average HR of 73 bpm 2. No atrial fibrillation or flutter 3. No high-grade heart block or pathologic pauses 4. There are rare PVC's and rare supraventricular beats without sustained arrhythmias 5.  Overall benign monitor result           EKG:   EKG Interpretation Date/Time:  Thursday October 14 2023 09:53:43 EST Ventricular Rate:  76 PR Interval:  146 QRS Duration:  96 QT Interval:  422 QTC Calculation: 474 R Axis:   258  Text Interpretation: Normal sinus rhythm Low voltage QRS Possible Inferior infarct , age undetermined Anterolateral infarct , age undetermined When compared with ECG of 18-Aug-2023 18:49, Significant changes have occurred since last tracing there is a new, age-indeterminate anterolateral infarct Confirmed by Tonny Bollman [phone]) on 10/14/2023 10:06:17 AM    Recent Labs: 07/08/2023: B Natriuretic Peptide 65.1 08/18/2023: BUN 11; Creatinine, Ser 0.94; Hemoglobin 15.2; Platelets 247; Potassium 4.4; Sodium 142   Recent Lipid Panel    Component Value Date/Time   CHOL 135 02/16/2022 0827   TRIG 90 02/16/2022 0827   HDL 28 (L) 02/16/2022 0827   CHOLHDL 4.8 02/16/2022 0827   CHOLHDL 4 03/19/2014 1116   VLDL 19.6 03/19/2014 1116   LDLCALC 90 02/16/2022 0827     Risk Assessment/Calculations:                Physical Exam:    VS:  BP 120/60   Pulse 76   Ht 5\' 9"  (1.753 m)   Wt 176 lb 12.8 oz (80.2 kg)   SpO2 97%  BMI 26.11 kg/m     Wt Readings from Last 3 Encounters:  10/14/23 176 lb 12.8 oz (80.2 kg)  08/18/23 185 lb (83.9 kg)  05/07/22 173 lb 1 oz (78.5 kg)     GEN:  Well nourished, well developed in no acute distress HEENT: Normal NECK: No JVD; No carotid bruits LYMPHATICS: No lymphadenopathy CARDIAC: RRR, no murmurs, rubs, gallops RESPIRATORY:  Clear to auscultation without rales, wheezing or rhonchi  ABDOMEN: Soft, non-tender, non-distended MUSCULOSKELETAL:  No edema; No deformity  SKIN: Warm and dry NEUROLOGIC:  Alert and oriented x 3 PSYCHIATRIC:  Normal affect   Assessment & Plan Coronary artery disease involving native coronary artery of native heart without angina pectoris The patient's EKG shows an interval anterolateral infarct since his previous tracing in December 2024.  He is likely having postinfarct angina as well as symptoms of heart failure.  I think urgent cardiac catheterization is indicated.  He is not having resting chest pain and clinically his infarct occurred last month.  I do not think he requires emergency room care, but I have recommended a cardiac catheterization in the next 24 hours.  He will be scheduled for cardiac catheterization and possible PCI tomorrow.  He is given ER precautions if he has resting chest pain unresponsive to sublingual nitroglycerin. I have reviewed the risks, indications, and alternatives to cardiac catheterization, possible angioplasty, and stenting with the patient. Risks include but are not limited to bleeding, infection,  vascular injury, stroke, myocardial infection, arrhythmia, kidney injury, radiation-related injury in the case of prolonged fluoroscopy use, emergency cardiac surgery, and death. The patient understands the risks of serious complication is 1-2 in 1000 with diagnostic cardiac cath and 1-2% or less with angioplasty/stenting.  I would recommend checking an echocardiogram while he was in the hospital to make sure that he does not have LV apical thrombus.  In the meantime, he will continue on aspirin, high intensity statin drug, carvedilol, and lisinopril.  I did not load him with the P2 Y12 inhibitor today.  I went back and reviewed his medicine program from his initial MI and he was treated with prasugrel and tolerated it well.  He ended up having issues with cost and was later switched to clopidogrel. Hyperlipidemia LDL goal <70 Treated with high intensity statin drug.  Will check a lipid panel this morning with his pre-cath labs Essential hypertension Blood pressure controlled on carvedilol and lisinopril Ischemic cardiomyopathy Check 2D echo Tobacco abuse Still smoking but states that he has "cut back" counseled on cessation.       Informed Consent   Shared Decision Making/Informed Consent The risks [stroke (1 in 1000), death (1 in 1000), kidney failure [usually temporary] (1 in 500), bleeding (1 in 200), allergic reaction [possibly serious] (1 in 200)], benefits (diagnostic support and management of coronary artery disease) and alternatives of a cardiac catheterization were discussed in detail with Mr. Shell and he is willing to proceed.       Medication Adjustments/Labs and Tests Ordered: Current medicines are reviewed at length with the patient today.  Concerns regarding medicines are outlined above.  Orders Placed This Encounter  Procedures   EKG 12-Lead   Meds ordered this encounter  Medications   atorvastatin (LIPITOR) 80 MG tablet    Sig: Take 1 tablet (80 mg total) by mouth daily  at 6 PM. Please call [phone] to schedule an appointment for future refills. Thank you. 1st attempt.    Dispense:  30 tablet    Refill:  11    Appointment needed for additional refills. Thank you.   carvedilol (COREG) 6.25 MG tablet    Sig: Take 1 tablet (6.25 mg total) by mouth 2 (two) times daily with a meal. Please call [phone] to schedule an appointment for future refills. Thank you. 1st attempt.    Dispense:  60 tablet    Refill:  11    Appointment needed for additional refills. Thank you.   ezetimibe (ZETIA) 10 MG tablet    Sig: Take 1 tablet (10 mg total) by mouth daily. Please call [phone] to schedule an appointment for future refills. Thank you. 1st attempt.    Dispense:  30 tablet    Refill:  11    Appointment needed for additional refills. Thank you.   lisinopril (ZESTRIL) 2.5 MG tablet    Sig: Take 1 tablet (2.5 mg total) by mouth daily. Please call [phone] to schedule an appointment for future refills. Thank you. 1st attempt.    Dispense:  30 tablet    Refill:  11    Appointment needed for additional refills. Thank you.   nitroGLYCERIN (NITROSTAT) 0.4 MG SL tablet    Sig: Place 1 tablet (0.4 mg total) under the tongue every 5 (five) minutes x 3 doses as needed for chest pain.    Dispense:  25 tablet    Refill:  5    There are no Patient Instructions on file for this visit.   Signed, Tonny Bollman, MD  10/14/2023 10:06 AM    [facility]

## 2023-10-14 NOTE — Assessment & Plan Note (Signed)
The patient's EKG shows an interval anterolateral infarct since his previous tracing in December 2024.  He is likely having postinfarct angina as well as symptoms of heart failure.  I think urgent cardiac catheterization is indicated.  He is not having resting chest pain and clinically his infarct occurred last month.  I do not think he requires emergency room care, but I have recommended a cardiac catheterization in the next 24 hours.  He will be scheduled for cardiac catheterization and possible PCI tomorrow.  He is given ER precautions if he has resting chest pain unresponsive to sublingual nitroglycerin. I have reviewed the risks, indications, and alternatives to cardiac catheterization, possible angioplasty, and stenting with the patient. Risks include but are not limited to bleeding, infection, vascular injury, stroke, myocardial infection, arrhythmia, kidney injury, radiation-related injury in the case of prolonged fluoroscopy use, emergency cardiac surgery, and death. The patient understands the risks of serious complication is 1-2 in 1000 with diagnostic cardiac cath and 1-2% or less with angioplasty/stenting.  I would recommend checking an echocardiogram while he was in the hospital to make sure that he does not have LV apical thrombus.  In the meantime, he will continue on aspirin, high intensity statin drug, carvedilol, and lisinopril.  I did not load him with the P2 Y12 inhibitor today.  I went back and reviewed his medicine program from his initial MI and he was treated with prasugrel and tolerated it well.  He ended up having issues with cost and was later switched to clopidogrel.

## 2023-10-14 NOTE — Patient Instructions (Signed)
Lab Work: CBC, BMET today If you have labs (blood work) drawn today and your tests are completely normal, you will receive your results only by: MyChart Message (if you have MyChart) OR A paper copy in the mail If you have any lab test that is abnormal or we need to change your treatment, we will call you to review the results.  Testing/Procedures: Left heart Catheterization Your physician has requested that you have a cardiac catheterization. Cardiac catheterization is used to diagnose and/or treat various heart conditions. Doctors may recommend this procedure for a number of different reasons. The most common reason is to evaluate chest pain. Chest pain can be a symptom of coronary artery disease (CAD), and cardiac catheterization can show whether plaque is narrowing or blocking your heart's arteries. This procedure is also used to evaluate the valves, as well as measure the blood flow and oxygen levels in different parts of your heart. For further information please visit [email or web address] Please follow instruction sheet, as given.  Follow-Up: At [facility], you and your health needs are our priority.  As part of our continuing mission to provide you with exceptional heart care, we have created designated Provider Care Teams.  These Care Teams include your primary Cardiologist (physician) and Advanced Practice Providers (APPs -  Physician Assistants and Nurse Practitioners) who all work together to provide you with the care you need, when you need it.  Your next appointment:   4 week(s)  Provider:   Tonny Bollman, MD       Other Instructions       Cardiac/Peripheral Catheterization   You are scheduled for a Cardiac Catheterization on Friday, January 31 with Dr. Alverda Skeans.  1. Please arrive at the [facility] (Main Entrance A) at [facility]: [address] [place], [place] [address] at 5:30 AM (This time is two hour(s) before your procedure to ensure your  preparation).   Free valet parking service is available. You will check in at ADMITTING. The support person will be asked to wait in the waiting room.  It is OK to have someone drop you off and come back when you are ready to be discharged.        Special note: Every effort is made to have your procedure done on time. Please understand that emergencies sometimes delay scheduled procedures.  2. Diet: Do not eat solid foods after midnight.  You may have clear liquids until 5 AM the day of the procedure.  3. Labs: Today  4. Medication instructions in preparation for your procedure:   Contrast Allergy: No  On the morning of your procedure, take Aspirin 81 mg and any morning medicines NOT listed above.  You may use sips of water.  5. Plan to go home the same day, you will only stay overnight if medically necessary. 6. You MUST have a responsible adult to drive you home. 7. An adult MUST be with you the first 24 hours after you arrive home. 8. Bring a current list of your medications, and the last time and date medication taken. 9. Bring ID and current insurance cards. 10.Please wear clothes that are easy to get on and off and wear slip-on shoes.  Thank you for allowing [place] to care for you!   -- [facility]

## 2023-10-14 NOTE — Progress Notes (Signed)
Cardiology Office Note:    Date:  10/14/2023   ID:  Kenneth Medina, DOB 05-21-1971, MRN [id number]  PCP:  Kenneth Medina   [facility] Providers Cardiologist:  Kenneth Bollman, MD     Referring MD: No ref. provider found   Chief Complaint  Patient presents with   Chest Pain    History of Present Illness:    Kenneth Medina is a 53 y.o. male with a hx of coronary artery disease, presenting for follow-up evaluation.  The patient initially presented with an anterior STEMI in 2015 and was treated with primary PCI of the LAD.  His post MI LVEF was 50%.  I have not seen him since 2018 but he has been following with our APP's here, last visit in 2023.  The patient is here alone today. He was seen at [facility] for chest pain in December 2024 when he was noted to have a nonischemic EKG and a high-sensitivity troponin mildly elevated at 19.  He waited in the lobby of the ER for about 4 hours and left after his chest pain eased up.  He reports fairly prolonged episode of discomfort that day and states that it felt pretty similar to his previous heart attack.  Since that time, he has had frequent symptoms of angina.  He describes substernal chest discomfort with low-level activity, easing off with rest.  He also complains of shortness of breath and orthopnea.  No edema, lightheadedness, or syncope.  He has experienced some episodes where he feels like his heart is beating fast.  He has not had resting chest discomfort over the past week.  However, he continues to feel a pressure-like sensation in his chest with any mild or moderate activity.  Current Medications: Current Meds  Medication Sig   aspirin 81 MG chewable tablet Chew 1 tablet (81 mg total) by mouth daily.   atorvastatin (LIPITOR) 80 MG tablet Take 1 tablet (80 mg total) by mouth daily at 6 PM. Please call [phone] to schedule an appointment for future refills. Thank you. 1st attempt.   carvedilol (COREG) 6.25 MG tablet Take 1 tablet (6.25  mg total) by mouth 2 (two) times daily with a meal. Please call [phone] to schedule an appointment for future refills. Thank you. 1st attempt.   ezetimibe (ZETIA) 10 MG tablet Take 1 tablet (10 mg total) by mouth daily. Please call [phone] to schedule an appointment for future refills. Thank you. 1st attempt.   lidocaine (LIDODERM) 5 % Place 1 patch onto the skin every 12 (twelve) hours. Remove & Discard patch within 12 hours or as directed by MD   lisinopril (ZESTRIL) 2.5 MG tablet Take 1 tablet (2.5 mg total) by mouth daily. Please call [phone] to schedule an appointment for future refills. Thank you. 1st attempt.   nitroGLYCERIN (NITROSTAT) 0.4 MG SL tablet Place 1 tablet (0.4 mg total) under the tongue every 5 (five) minutes x 3 doses as needed for chest pain.   [DISCONTINUED] atorvastatin (LIPITOR) 80 MG tablet Take 1 tablet (80 mg total) by mouth daily at 6 PM. Please call [phone] to schedule an appointment for future refills. Thank you. 1st attempt.   [DISCONTINUED] carvedilol (COREG) 6.25 MG tablet Take 1 tablet (6.25 mg total) by mouth 2 (two) times daily with a meal. Please call [phone] to schedule an appointment for future refills. Thank you. 1st attempt.   [DISCONTINUED] ezetimibe (ZETIA) 10 MG tablet Take 1 tablet (10 mg total) by mouth daily. Please call [phone] to schedule  an appointment for future refills. Thank you. 1st attempt.   [DISCONTINUED] lisinopril (ZESTRIL) 2.5 MG tablet Take 1 tablet (2.5 mg total) by mouth daily. Please call [phone] to schedule an appointment for future refills. Thank you. 1st attempt.   [DISCONTINUED] Menthol-Methyl Salicylate (MUSCLE RUB) 10-15 % CREA Apply 1 Application topically as needed.   [DISCONTINUED] nicotine (NICODERM CQ - DOSED IN MG/24 HOURS) 14 mg/24hr patch Place 1 patch (14 mg total) onto the skin daily. FOR 30 DAYS   [DISCONTINUED] nicotine (NICODERM CQ - DOSED IN MG/24 HOURS) 21 mg/24hr patch Place 1 patch  (21 mg total) onto the skin daily. FOR 30 DAYS   [DISCONTINUED] nicotine (NICODERM CQ) 7 mg/24hr patch Place 1 patch (7 mg total) onto the skin daily. FOR 30 DAYS   [DISCONTINUED] nitroGLYCERIN (NITROSTAT) 0.4 MG SL tablet Place 1 tablet (0.4 mg total) under the tongue every 5 (five) minutes x 3 doses as needed for chest pain.   [DISCONTINUED] ondansetron (ZOFRAN ODT) 4 MG disintegrating tablet Take 1 tablet (4 mg total) by mouth every 8 (eight) hours as needed for nausea or vomiting.   [DISCONTINUED] oxyCODONE (ROXICODONE) 5 MG immediate release tablet Take 1 tablet (5 mg total) by mouth every 8 (eight) hours as needed for up to 12 doses.     Allergies:   Ibuprofen   ROS:   Please see the history of present illness.    All other systems reviewed and are negative.  EKGs/Labs/Other Studies Reviewed:    The following studies were reviewed today: Cardiac Studies & Procedures     STRESS TESTS  MYOCARDIAL PERFUSION IMAGING 09/21/2019  Narrative  The left ventricular ejection fraction is normal (55-65%).  Nuclear stress EF: 57%. No wall motion abnormalities  There was no ST segment deviation noted during stress.  The study is normal.  This is a low risk study. No ischemia identified  Kenneth Schultz, MD  ECHOCARDIOGRAM  ECHOCARDIOGRAM COMPLETE 02/16/2022  Narrative ECHOCARDIOGRAM REPORT    Patient Name:   Kenneth Medina Date of Exam: 02/16/2022 Medical Rec #:  [id number]      Height:       69.0 in Accession #:    [id number]     Weight:       180.0 lb Date of Birth:  1970-10-11       BSA:          1.976 m Patient Age:    50 years       BP:           100/60 mmHg Patient Gender: M              HR:           66 bpm. Exam Location:  [address]  Procedure: 2D Echo, 3D Echo, Cardiac Doppler, Color Doppler and Strain Analysis  Indications:    R06.00 Dyspnea  History:        Patient has prior history of Echocardiogram examinations, most recent 03/12/2016. CAD and Previous Myocardial  Infarction, Signs/Symptoms:Shortness of Breath, Dizziness/Lightheadedness, Fatigue and Chest Pain; Risk Factors:Hypertension, Dyslipidemia and Current Smoker. Palpitations, Ischemic Cardiomyopathy.  Sonographer:    Farrel Conners RDCS Referring Phys: Zachary George SWINYER  IMPRESSIONS   1. Left ventricular ejection fraction, by estimation, is 65 to 70%. Left ventricular ejection fraction by 3D volume is 67 %. The left ventricle has normal function. The left ventricle has no regional wall motion abnormalities. Left ventricular diastolic parameters were normal. The average left ventricular global longitudinal strain is  22.6 %. The global longitudinal strain is normal. 2. Right ventricular systolic function is normal. The right ventricular size is normal. There is normal pulmonary artery systolic pressure. 3. The mitral valve is normal in structure. No evidence of mitral valve regurgitation. 4. The aortic valve is normal in structure. Aortic valve regurgitation is not visualized.  FINDINGS Left Ventricle: Left ventricular ejection fraction, by estimation, is 65 to 70%. Left ventricular ejection fraction by 3D volume is 67 %. The left ventricle has normal function. The left ventricle has no regional wall motion abnormalities. The average left ventricular global longitudinal strain is 22.6 %. The global longitudinal strain is normal. The left ventricular internal cavity size was normal in size. There is no left ventricular hypertrophy. Left ventricular diastolic parameters were normal.  Right Ventricle: The right ventricular size is normal. Right vetricular wall thickness was not well visualized. Right ventricular systolic function is normal. There is normal pulmonary artery systolic pressure. The tricuspid regurgitant velocity is 2.23 m/s, and with an assumed right atrial pressure of 3 mmHg, the estimated right ventricular systolic pressure is 22.9 mmHg.  Left Atrium: Left atrial size was normal  in size.  Right Atrium: Right atrial size was normal in size.  Pericardium: There is no evidence of pericardial effusion.  Mitral Valve: The mitral valve is normal in structure. No evidence of mitral valve regurgitation.  Tricuspid Valve: The tricuspid valve is normal in structure. Tricuspid valve regurgitation is not demonstrated.  Aortic Valve: The aortic valve is normal in structure. Aortic valve regurgitation is not visualized.  Pulmonic Valve: The pulmonic valve was normal in structure. Pulmonic valve regurgitation is not visualized.  Aorta: The aortic root and ascending aorta are structurally normal, with no evidence of dilitation.  IAS/Shunts: No atrial level shunt detected by color flow Doppler.   LEFT VENTRICLE PLAX 2D LVIDd:         4.80 cm         Diastology LVIDs:         2.75 cm         LV e' medial:    10.30 cm/s LV PW:         0.90 cm         LV E/e' medial:  6.7 LV IVS:        0.85 cm         LV e' lateral:   12.10 cm/s LVOT diam:     2.30 cm         LV E/e' lateral: 5.7 LV SV:         86 LV SV Index:   44              2D LVOT Area:     4.15 cm        Longitudinal Strain 2D Strain GLS  25.2 % (A2C): 2D Strain GLS  21.7 % (A3C): 2D Strain GLS  20.9 % (A4C): 2D Strain GLS  22.6 % Avg:  3D Volume EF LV 3D EF:    Left ventricul ar ejection fraction by 3D volume is 67 %.  3D Volume EF: 3D EF:        67 % LV EDV:       121 ml LV ESV:       40 ml LV SV:        81 ml  RIGHT VENTRICLE RV Basal diam:  4.40 cm RV Mid diam:    4.10 cm RV S prime:  10.90 cm/s TAPSE (M-mode): 2.5 cm  LEFT ATRIUM             Index        RIGHT ATRIUM           Index LA diam:        3.60 cm 1.82 cm/m   RA Area:     22.90 cm LA Vol (A2C):   62.4 ml 31.59 ml/m  RA Volume:   78.90 ml  39.94 ml/m LA Vol (A4C):   52.5 ml 26.58 ml/m LA Biplane Vol: 57.2 ml 28.95 ml/m AORTIC VALVE LVOT Vmax:   103.00 cm/s LVOT Vmean:  61.550 cm/s LVOT VTI:    0.207  m  AORTA Ao Root diam: 3.40 cm Ao Asc diam:  3.30 cm  MITRAL VALVE               TRICUSPID VALVE MV Area (PHT)  cm         TR Peak grad:   19.9 mmHg MV Decel Time: 205 msec    TR Vmax:        223.00 cm/s MV E velocity: 69.15 cm/s MV A velocity: 63.40 cm/s  SHUNTS MV E/A ratio:  1.09        Systemic VTI:  0.21 m Systemic Diam: 2.30 cm  Kristeen Miss MD Electronically signed by Kristeen Miss MD Signature Date/Time: 02/16/2022/11:50:29 AM    Final   MONITORS  LONG TERM MONITOR (3-14 DAYS) 02/16/2022  Narrative Patch Wear Time:  10 days and 12 hours (2023-05-16T22:09:52-0400 to 2023-05-27T11:00:22-0400)  Patient had a min HR of 45 bpm, max HR of 122 bpm, and avg HR of 73 bpm. Predominant underlying rhythm was Sinus Rhythm. Isolated SVEs were rare (<1.0%), SVE Couplets were rare (<1.0%), and no SVE Triplets were present. Isolated VEs were rare (<1.0%), and no VE Couplets or VE Triplets were present.  1. The basic rhythm is normal sinus with an average HR of 73 bpm 2. No atrial fibrillation or flutter 3. No high-grade heart block or pathologic pauses 4. There are rare PVC's and rare supraventricular beats without sustained arrhythmias 5.  Overall benign monitor result           EKG:   EKG Interpretation Date/Time:  Thursday October 14 2023 09:53:43 EST Ventricular Rate:  76 PR Interval:  146 QRS Duration:  96 QT Interval:  422 QTC Calculation: 474 R Axis:   258  Text Interpretation: Normal sinus rhythm Low voltage QRS Possible Inferior infarct , age undetermined Anterolateral infarct , age undetermined When compared with ECG of 18-Aug-2023 18:49, Significant changes have occurred since last tracing there is a new, age-indeterminate anterolateral infarct Confirmed by Kenneth Medina [phone]) on 10/14/2023 10:06:17 AM    Recent Labs: 07/08/2023: B Natriuretic Peptide 65.1 08/18/2023: BUN 11; Creatinine, Ser 0.94; Hemoglobin 15.2; Platelets 247; Potassium 4.4; Sodium 142   Recent Lipid Panel    Component Value Date/Time   CHOL 135 02/16/2022 0827   TRIG 90 02/16/2022 0827   HDL 28 (L) 02/16/2022 0827   CHOLHDL 4.8 02/16/2022 0827   CHOLHDL 4 03/19/2014 1116   VLDL 19.6 03/19/2014 1116   LDLCALC 90 02/16/2022 0827     Risk Assessment/Calculations:                Physical Exam:    VS:  BP 120/60   Pulse 76   Ht 5\' 9"  (1.753 m)   Wt 176 lb 12.8 oz (80.2 kg)   SpO2 97%  BMI 26.11 kg/m     Wt Readings from Last 3 Encounters:  10/14/23 176 lb 12.8 oz (80.2 kg)  08/18/23 185 lb (83.9 kg)  05/07/22 173 lb 1 oz (78.5 kg)     GEN:  Well nourished, well developed in no acute distress HEENT: Normal NECK: No JVD; No carotid bruits LYMPHATICS: No lymphadenopathy CARDIAC: RRR, no murmurs, rubs, gallops RESPIRATORY:  Clear to auscultation without rales, wheezing or rhonchi  ABDOMEN: Soft, non-tender, non-distended MUSCULOSKELETAL:  No edema; No deformity  SKIN: Warm and dry NEUROLOGIC:  Alert and oriented x 3 PSYCHIATRIC:  Normal affect   Assessment & Plan Coronary artery disease involving native coronary artery of native heart without angina pectoris The patient's EKG shows an interval anterolateral infarct since his previous tracing in December 2024.  He is likely having postinfarct angina as well as symptoms of heart failure.  I think urgent cardiac catheterization is indicated.  He is not having resting chest pain and clinically his infarct occurred last month.  I do not think he requires emergency room care, but I have recommended a cardiac catheterization in the next 24 hours.  He will be scheduled for cardiac catheterization and possible PCI tomorrow.  He is given ER precautions if he has resting chest pain unresponsive to sublingual nitroglycerin. I have reviewed the risks, indications, and alternatives to cardiac catheterization, possible angioplasty, and stenting with the patient. Risks include but are not limited to bleeding, infection,  vascular injury, stroke, myocardial infection, arrhythmia, kidney injury, radiation-related injury in the case of prolonged fluoroscopy use, emergency cardiac surgery, and death. The patient understands the risks of serious complication is 1-2 in 1000 with diagnostic cardiac cath and 1-2% or less with angioplasty/stenting.  I would recommend checking an echocardiogram while he was in the hospital to make sure that he does not have LV apical thrombus.  In the meantime, he will continue on aspirin, high intensity statin drug, carvedilol, and lisinopril.  I did not load him with the P2 Y12 inhibitor today.  I went back and reviewed his medicine program from his initial MI and he was treated with prasugrel and tolerated it well.  He ended up having issues with cost and was later switched to clopidogrel. Hyperlipidemia LDL goal <70 Treated with high intensity statin drug.  Will check a lipid panel this morning with his pre-cath labs Essential hypertension Blood pressure controlled on carvedilol and lisinopril Ischemic cardiomyopathy Check 2D echo Tobacco abuse Still smoking but states that he has "cut back" counseled on cessation.       Informed Consent   Shared Decision Making/Informed Consent The risks [stroke (1 in 1000), death (1 in 1000), kidney failure [usually temporary] (1 in 500), bleeding (1 in 200), allergic reaction [possibly serious] (1 in 200)], benefits (diagnostic support and management of coronary artery disease) and alternatives of a cardiac catheterization were discussed in detail with Mr. Shell and he is willing to proceed.       Medication Adjustments/Labs and Tests Ordered: Current medicines are reviewed at length with the patient today.  Concerns regarding medicines are outlined above.  Orders Placed This Encounter  Procedures   EKG 12-Lead   Meds ordered this encounter  Medications   atorvastatin (LIPITOR) 80 MG tablet    Sig: Take 1 tablet (80 mg total) by mouth daily  at 6 PM. Please call [phone] to schedule an appointment for future refills. Thank you. 1st attempt.    Dispense:  30 tablet    Refill:  11    Appointment needed for additional refills. Thank you.   carvedilol (COREG) 6.25 MG tablet    Sig: Take 1 tablet (6.25 mg total) by mouth 2 (two) times daily with a meal. Please call [phone] to schedule an appointment for future refills. Thank you. 1st attempt.    Dispense:  60 tablet    Refill:  11    Appointment needed for additional refills. Thank you.   ezetimibe (ZETIA) 10 MG tablet    Sig: Take 1 tablet (10 mg total) by mouth daily. Please call [phone] to schedule an appointment for future refills. Thank you. 1st attempt.    Dispense:  30 tablet    Refill:  11    Appointment needed for additional refills. Thank you.   lisinopril (ZESTRIL) 2.5 MG tablet    Sig: Take 1 tablet (2.5 mg total) by mouth daily. Please call [phone] to schedule an appointment for future refills. Thank you. 1st attempt.    Dispense:  30 tablet    Refill:  11    Appointment needed for additional refills. Thank you.   nitroGLYCERIN (NITROSTAT) 0.4 MG SL tablet    Sig: Place 1 tablet (0.4 mg total) under the tongue every 5 (five) minutes x 3 doses as needed for chest pain.    Dispense:  25 tablet    Refill:  5    There are no Patient Instructions on file for this visit.   Signed, Kenneth Bollman, MD  10/14/2023 10:06 AM    [facility]

## 2023-10-15 ENCOUNTER — Encounter (HOSPITAL_COMMUNITY)
Admission: AD | Disposition: A | Discharge status: Home / Self Care | Payer: Self-pay | Source: Home / Self Care | Attending: Internal Medicine

## 2023-10-15 ENCOUNTER — Other Ambulatory Visit (HOSPITAL_COMMUNITY): Payer: Self-pay

## 2023-10-15 ENCOUNTER — Other Ambulatory Visit: Payer: Self-pay

## 2023-10-15 ENCOUNTER — Encounter (HOSPITAL_COMMUNITY): Payer: Self-pay | Admitting: Internal Medicine

## 2023-10-15 ENCOUNTER — Inpatient Hospital Stay (HOSPITAL_COMMUNITY)
Admission: AD | Admit: 2023-10-15 | Discharge: 2023-10-19 | DRG: 321 | Disposition: A | Discharge status: Home / Self Care | Payer: Non-veteran care | Attending: Cardiology | Admitting: Cardiology

## 2023-10-15 DIAGNOSIS — E785 Hyperlipidemia, unspecified: Secondary | ICD-10-CM

## 2023-10-15 DIAGNOSIS — Z720 Tobacco use: Secondary | ICD-10-CM | POA: Diagnosis present

## 2023-10-15 DIAGNOSIS — I5043 Acute on chronic combined systolic (congestive) and diastolic (congestive) heart failure: Secondary | ICD-10-CM | POA: Diagnosis present

## 2023-10-15 DIAGNOSIS — T82855D Stenosis of coronary artery stent, subsequent encounter: Secondary | ICD-10-CM | POA: Diagnosis not present

## 2023-10-15 DIAGNOSIS — I110 Hypertensive heart disease with heart failure: Secondary | ICD-10-CM | POA: Diagnosis present

## 2023-10-15 DIAGNOSIS — I10 Essential (primary) hypertension: Secondary | ICD-10-CM | POA: Diagnosis present

## 2023-10-15 DIAGNOSIS — I25119 Atherosclerotic heart disease of native coronary artery with unspecified angina pectoris: Secondary | ICD-10-CM | POA: Diagnosis present

## 2023-10-15 DIAGNOSIS — I2510 Atherosclerotic heart disease of native coronary artery without angina pectoris: Principal | ICD-10-CM

## 2023-10-15 DIAGNOSIS — I5023 Acute on chronic systolic (congestive) heart failure: Secondary | ICD-10-CM | POA: Diagnosis present

## 2023-10-15 DIAGNOSIS — G4733 Obstructive sleep apnea (adult) (pediatric): Secondary | ICD-10-CM | POA: Diagnosis present

## 2023-10-15 DIAGNOSIS — I252 Old myocardial infarction: Secondary | ICD-10-CM

## 2023-10-15 DIAGNOSIS — Z79899 Other long term (current) drug therapy: Secondary | ICD-10-CM

## 2023-10-15 DIAGNOSIS — G4730 Sleep apnea, unspecified: Secondary | ICD-10-CM | POA: Diagnosis present

## 2023-10-15 DIAGNOSIS — Z7982 Long term (current) use of aspirin: Secondary | ICD-10-CM

## 2023-10-15 DIAGNOSIS — F17210 Nicotine dependence, cigarettes, uncomplicated: Secondary | ICD-10-CM | POA: Diagnosis present

## 2023-10-15 DIAGNOSIS — I237 Postinfarction angina: Secondary | ICD-10-CM | POA: Diagnosis present

## 2023-10-15 DIAGNOSIS — I255 Ischemic cardiomyopathy: Secondary | ICD-10-CM | POA: Diagnosis present

## 2023-10-15 DIAGNOSIS — Z7984 Long term (current) use of oral hypoglycemic drugs: Secondary | ICD-10-CM | POA: Diagnosis not present

## 2023-10-15 DIAGNOSIS — Z716 Tobacco abuse counseling: Secondary | ICD-10-CM | POA: Diagnosis not present

## 2023-10-15 DIAGNOSIS — Z955 Presence of coronary angioplasty implant and graft: Secondary | ICD-10-CM

## 2023-10-15 DIAGNOSIS — I25118 Atherosclerotic heart disease of native coronary artery with other forms of angina pectoris: Secondary | ICD-10-CM | POA: Diagnosis present

## 2023-10-15 HISTORY — PX: LEFT HEART CATH AND CORONARY ANGIOGRAPHY: CATH118249

## 2023-10-15 LAB — LIPID PANEL
Chol/HDL Ratio: 6 {ratio} — ABNORMAL HIGH (ref 0.0–5.0)
Cholesterol, Total: 145 mg/dL (ref 100–199)
HDL: 24 mg/dL — ABNORMAL LOW (ref >39)
LDL Chol Calc (NIH): 104 mg/dL — ABNORMAL HIGH (ref 0–99)
Triglycerides: 90 mg/dL (ref 0–149)
VLDL Cholesterol Cal: 17 mg/dL (ref 5–40)

## 2023-10-15 LAB — BASIC METABOLIC PANEL
Anion gap: 10 (ref 5–15)
BUN/Creatinine Ratio: 10 (ref 9–20)
BUN: 10 mg/dL (ref 6–24)
BUN: 10 mg/dL (ref 6–20)
CO2: 22 mmol/L (ref 20–29)
CO2: 24 mmol/L (ref 22–32)
Calcium: 9.4 mg/dL (ref 8.7–10.2)
Calcium: 8.8 mg/dL — ABNORMAL LOW (ref 8.9–10.3)
Chloride: 104 mmol/L (ref 96–106)
Chloride: 106 mmol/L (ref 98–111)
Creatinine, Ser: 0.98 mg/dL (ref 0.76–1.27)
Creatinine, Ser: 0.94 mg/dL (ref 0.61–1.24)
GFR, Estimated: >60 mL/min (ref >60)
Glucose, Bld: 98 mg/dL (ref 70–99)
Glucose: 86 mg/dL (ref 70–99)
Potassium: 5.6 mmol/L — ABNORMAL HIGH (ref 3.5–5.2)
Potassium: 4.3 mmol/L (ref 3.5–5.1)
Sodium: 143 mmol/L (ref 134–144)
Sodium: 140 mmol/L (ref 135–145)
eGFR: 92 mL/min/{1.73_m2} (ref >59)

## 2023-10-15 LAB — CBC
Hematocrit: 44.6 % (ref 37.5–51.0)
Hemoglobin: 14.5 g/dL (ref 13.0–17.7)
MCH: 29.7 pg (ref 26.6–33.0)
MCHC: 32.5 g/dL (ref 31.5–35.7)
MCV: 91 fL (ref 79–97)
Platelets: 396 10*3/uL (ref 150–450)
RBC: 4.89 x10E6/uL (ref 4.14–5.80)
RDW: 12.2 % (ref 11.6–15.4)
WBC: 11.3 10*3/uL — ABNORMAL HIGH (ref 3.4–10.8)

## 2023-10-15 SURGERY — LEFT HEART CATH AND CORONARY ANGIOGRAPHY
Anesthesia: LOCAL

## 2023-10-15 MED ORDER — ASPIRIN 81 MG PO CHEW
81.0000 mg | CHEWABLE_TABLET | ORAL | Status: AC
Start: 2023-10-15 — End: 2023-10-15
  Administered 2023-10-15: 81 mg via ORAL
  Filled 2023-10-15: qty 1

## 2023-10-15 MED ORDER — FUROSEMIDE 10 MG/ML IJ SOLN
INTRAMUSCULAR | Status: DC | PRN
  Administered 2023-10-15: 10 mg via INTRAVENOUS

## 2023-10-15 MED ORDER — FUROSEMIDE 10 MG/ML IJ SOLN
INTRAMUSCULAR | Status: AC
  Filled 2023-10-15: qty 4

## 2023-10-15 MED ORDER — FUROSEMIDE 10 MG/ML IJ SOLN
40.0000 mg | Freq: Two times a day (BID) | INTRAMUSCULAR | Status: DC
  Administered 2023-10-15 – 2023-10-17 (×6): 40 mg via INTRAVENOUS
  Filled 2023-10-15 (×5): qty 4

## 2023-10-15 MED ORDER — MIDAZOLAM HCL 2 MG/2ML IJ SOLN
INTRAMUSCULAR | Status: AC
  Filled 2023-10-15: qty 2

## 2023-10-15 MED ORDER — VERAPAMIL HCL 2.5 MG/ML IV SOLN
INTRAVENOUS | Status: DC | PRN
  Administered 2023-10-15: 10 mL via INTRA_ARTERIAL

## 2023-10-15 MED ORDER — MIDAZOLAM HCL 2 MG/2ML IJ SOLN
INTRAMUSCULAR | Status: DC | PRN
  Administered 2023-10-15: 1 mg via INTRAVENOUS

## 2023-10-15 MED ORDER — VERAPAMIL HCL 2.5 MG/ML IV SOLN
INTRAVENOUS | Status: AC
  Filled 2023-10-15: qty 2

## 2023-10-15 MED ORDER — SODIUM CHLORIDE 0.9% FLUSH: 3.0000 mL | INTRAVENOUS | Status: DC | PRN

## 2023-10-15 MED ORDER — HEPARIN SODIUM (PORCINE) 1000 UNIT/ML IJ SOLN
INTRAMUSCULAR | Status: AC
  Filled 2023-10-15: qty 10

## 2023-10-15 MED ORDER — ACETAMINOPHEN 325 MG PO TABS
650.0000 mg | ORAL_TABLET | ORAL | Status: DC | PRN
Start: 2023-10-15 — End: 2023-10-19

## 2023-10-15 MED ORDER — SODIUM CHLORIDE 0.9 % IV SOLN: 250.0000 mL | INTRAVENOUS | Status: AC | PRN

## 2023-10-15 MED ORDER — FENTANYL CITRATE (PF) 100 MCG/2ML IJ SOLN
INTRAMUSCULAR | Status: AC
  Filled 2023-10-15: qty 2

## 2023-10-15 MED ORDER — LIDOCAINE HCL (PF) 1 % IJ SOLN
INTRAMUSCULAR | Status: DC | PRN
  Administered 2023-10-15: 2 mL

## 2023-10-15 MED ORDER — IOHEXOL 350 MG/ML SOLN
INTRAVENOUS | Status: DC | PRN
  Administered 2023-10-15: 50 mL

## 2023-10-15 MED ORDER — HEPARIN (PORCINE) IN NACL 1000-0.9 UT/500ML-% IV SOLN
INTRAVENOUS | Status: DC | PRN
  Administered 2023-10-15: 1000 mL

## 2023-10-15 MED ORDER — LABETALOL HCL 5 MG/ML IV SOLN: 10.0000 mg | INTRAVENOUS | Status: AC | PRN

## 2023-10-15 MED ORDER — SODIUM CHLORIDE 0.9 % WEIGHT BASED INFUSION: 3.0000 mL/kg/h | INTRAVENOUS | Status: DC

## 2023-10-15 MED ORDER — LIDOCAINE HCL (PF) 1 % IJ SOLN
INTRAMUSCULAR | Status: AC
  Filled 2023-10-15: qty 30

## 2023-10-15 MED ORDER — ASPIRIN 81 MG PO CHEW
81.0000 mg | CHEWABLE_TABLET | Freq: Every day | ORAL | Status: DC
  Administered 2023-10-16 – 2023-10-19 (×3): 81 mg via ORAL
  Filled 2023-10-15 (×4): qty 1

## 2023-10-15 MED ORDER — ONDANSETRON HCL 4 MG/2ML IJ SOLN: 4.0000 mg | Freq: Four times a day (QID) | INTRAMUSCULAR | Status: DC | PRN

## 2023-10-15 MED ORDER — HEPARIN SODIUM (PORCINE) 1000 UNIT/ML IJ SOLN
INTRAMUSCULAR | Status: DC | PRN
  Administered 2023-10-15: 5000 [IU] via INTRA_ARTERIAL

## 2023-10-15 MED ORDER — ATORVASTATIN CALCIUM 80 MG PO TABS
80.0000 mg | ORAL_TABLET | Freq: Every day | ORAL | Status: DC
  Administered 2023-10-15 – 2023-10-19 (×5): 80 mg via ORAL
  Filled 2023-10-15 (×5): qty 1

## 2023-10-15 MED ORDER — SODIUM CHLORIDE 0.9 % WEIGHT BASED INFUSION: 1.0000 mL/kg/h | INTRAVENOUS | Status: DC

## 2023-10-15 MED ORDER — HYDRALAZINE HCL 20 MG/ML IJ SOLN: 10.0000 mg | INTRAMUSCULAR | Status: AC | PRN

## 2023-10-15 MED ORDER — FENTANYL CITRATE (PF) 100 MCG/2ML IJ SOLN
INTRAMUSCULAR | Status: DC | PRN
  Administered 2023-10-15: 25 ug via INTRAVENOUS

## 2023-10-15 MED ORDER — SODIUM CHLORIDE 0.9% FLUSH: 3.0000 mL | Freq: Two times a day (BID) | INTRAVENOUS | Status: DC

## 2023-10-15 SURGICAL SUPPLY — 10 items
CATH INFINITI 5 FR JL3.5 (CATHETERS) IMPLANT
CATH INFINITI 5FR ANG PIGTAIL (CATHETERS) IMPLANT
CATH INFINITI AMBI 6FR TG (CATHETERS) IMPLANT
DEVICE RAD COMP TR BAND LRG (VASCULAR PRODUCTS) IMPLANT
GLIDESHEATH SLEND SS 6F .021 (SHEATH) IMPLANT
GUIDEWIRE INQWIRE 1.5J.035X260 (WIRE) IMPLANT
INQWIRE 1.5J .035X260CM (WIRE) ×1
KIT SYRINGE INJ CVI SPIKEX1 (MISCELLANEOUS) IMPLANT
PACK CARDIAC CATHETERIZATION (CUSTOM PROCEDURE TRAY) ×1 IMPLANT
SET ATX-X65L (MISCELLANEOUS) IMPLANT

## 2023-10-15 NOTE — Interval H&P Note (Signed)
History and Physical Interval Note:  10/15/2023 6:27 AM  Kenneth Medina.  has presented today for surgery, with the diagnosis of chest pain.  The various methods of treatment have been discussed with the patient and family. After consideration of risks, benefits and other options for treatment, the patient has consented to  Procedure(s): LEFT HEART CATH AND CORONARY ANGIOGRAPHY (N/A) as a surgical intervention.  The patient's history has been reviewed, patient examined, no change in status, stable for surgery.  I have reviewed the patient's chart and labs.  Questions were answered to the patient's satisfaction.     Orbie Pyo

## 2023-10-15 NOTE — Progress Notes (Signed)
LOCATION: right RADIAL  DEFLATED PER PROTOCOL: YES  TIME BAND OFF/DRESSING APPLIED: 1220, gauze with tegaderm  SITE UPON ARRIVAL: LEVEL 0  SITE AFTER BAND REMOVAL: LEVEL 0  CIRCULATION SENSATION AND MOVEMENT: +2 radial pulse bilaterally, + movement  COMMENTS:

## 2023-10-16 ENCOUNTER — Inpatient Hospital Stay (HOSPITAL_COMMUNITY): Payer: Non-veteran care

## 2023-10-16 DIAGNOSIS — I5023 Acute on chronic systolic (congestive) heart failure: Secondary | ICD-10-CM

## 2023-10-16 DIAGNOSIS — E7849 Other hyperlipidemia: Secondary | ICD-10-CM

## 2023-10-16 DIAGNOSIS — I237 Postinfarction angina: Secondary | ICD-10-CM

## 2023-10-16 DIAGNOSIS — I5021 Acute systolic (congestive) heart failure: Secondary | ICD-10-CM

## 2023-10-16 LAB — ECHOCARDIOGRAM COMPLETE
AR max vel: 2.94 cm2
AV Peak grad: 3.2 mm[Hg]
Ao pk vel: 0.89 m/s
Area-P 1/2: 3.99 cm2
Height: 69 in
S' Lateral: 3.5 cm
Weight: 2816 [oz_av]

## 2023-10-16 LAB — COOXEMETRY PANEL
Carboxyhemoglobin: 2.1 % — ABNORMAL HIGH (ref 0.5–1.5)
Methemoglobin: 0.8 % (ref 0.0–1.5)
O2 Saturation: 67.3 %
Total hemoglobin: 15.2 g/dL (ref 12.0–16.0)

## 2023-10-16 LAB — BASIC METABOLIC PANEL
Anion gap: 13 (ref 5–15)
BUN: 14 mg/dL (ref 6–20)
CO2: 23 mmol/L (ref 22–32)
Calcium: 8.8 mg/dL — ABNORMAL LOW (ref 8.9–10.3)
Chloride: 101 mmol/L (ref 98–111)
Creatinine, Ser: 0.99 mg/dL (ref 0.61–1.24)
GFR, Estimated: >60 mL/min (ref >60)
Glucose, Bld: 108 mg/dL — ABNORMAL HIGH (ref 70–99)
Potassium: 3.9 mmol/L (ref 3.5–5.1)
Sodium: 137 mmol/L (ref 135–145)

## 2023-10-16 LAB — MAGNESIUM: Magnesium: 2.2 mg/dL (ref 1.7–2.4)

## 2023-10-16 MED ORDER — CHLORHEXIDINE GLUCONATE CLOTH 2 % EX PADS
6.0000 | MEDICATED_PAD | Freq: Every day | CUTANEOUS | Status: DC
  Administered 2023-10-16 – 2023-10-19 (×4): 6 via TOPICAL

## 2023-10-16 MED ORDER — PERFLUTREN LIPID MICROSPHERE
1.0000 mL | INTRAVENOUS | Status: AC | PRN
  Administered 2023-10-16: 10 mL via INTRAVENOUS

## 2023-10-16 MED ORDER — SODIUM CHLORIDE 0.9% FLUSH
10.0000 mL | INTRAVENOUS | Status: DC | PRN
Start: 2023-10-16 — End: 2023-10-19

## 2023-10-16 MED ORDER — METOPROLOL SUCCINATE ER 25 MG PO TB24
12.5000 mg | ORAL_TABLET | Freq: Every day | ORAL | Status: DC
Start: 2023-10-16 — End: 2023-10-19
  Administered 2023-10-16 – 2023-10-19 (×4): 12.5 mg via ORAL
  Filled 2023-10-16 (×4): qty 1

## 2023-10-16 MED ORDER — SODIUM CHLORIDE 0.9% FLUSH
10.0000 mL | Freq: Two times a day (BID) | INTRAVENOUS | Status: DC
  Administered 2023-10-16 – 2023-10-17 (×2): 20 mL
  Administered 2023-10-19: 10 mL

## 2023-10-16 NOTE — Progress Notes (Signed)
Peripherally Inserted Central Catheter Placement  The IV Nurse has discussed with the patient and/or persons authorized to consent for the patient, the purpose of this procedure and the potential benefits and risks involved with this procedure.  The benefits include less needle sticks, lab draws from the catheter, and the patient may be discharged home with the catheter. Risks include, but not limited to, infection, bleeding, blood clot (thrombus formation), and puncture of an artery; nerve damage and irregular heartbeat and possibility to perform a PICC exchange if needed/ordered by physician.  Alternatives to this procedure were also discussed.  Bard Power PICC patient education guide, fact sheet on infection prevention and patient information card has been provided to patient /or left at bedside.    PICC Placement Documentation  PICC Double Lumen 10/16/23 Right Brachial 37 cm 0 cm (Active)  Indication for Insertion or Continuance of Line Administration of hyperosmolar/irritating solutions (i.e. TPN, Vancomycin, etc.);Vasoactive infusions;Chronic illness with exacerbations (CF, Sickle Cell, etc.) 10/16/23 0853  Exposed Catheter (cm) 0 cm 10/16/23 0853  Site Assessment Clean, Dry, Intact 10/16/23 0853  Lumen #1 Status Flushed;Saline locked;Blood return noted 10/16/23 0853  Lumen #2 Status Flushed;Saline locked;Blood return noted 10/16/23 0853  Dressing Type Securing device;Transparent 10/16/23 0853  Dressing Status Antimicrobial disc/dressing in place;Clean, Dry, Intact 10/16/23 0853  Line Care Connections checked and tightened 10/16/23 0853  Line Adjustment (NICU/IV Team Only) No 10/16/23 0853  Dressing Intervention New dressing;Adhesive placed at insertion site (IV team only);Adhesive placed around edges of dressing (IV team/ICU RN only) 10/16/23 0853  Dressing Change Due 10/23/23 10/16/23 0853       Kenneth Medina 10/16/2023, 8:54 AM

## 2023-10-16 NOTE — Progress Notes (Signed)
Progress Note  Patient Name: Kenneth Medina. Date of Encounter: 10/16/2023  Primary Cardiologist: Tonny Bollman, MD  Subjective   No acute events overnight.  SOB improving.  No chest pain.  PICC line in place.  Inpatient Medications    Scheduled Meds:  aspirin  81 mg Oral Daily   atorvastatin  80 mg Oral Daily   Chlorhexidine Gluconate Cloth  6 each Topical Daily   furosemide  40 mg Intravenous BID   sodium chloride flush  10-40 mL Intracatheter Q12H   sodium chloride flush  3 mL Intravenous Q12H   Continuous Infusions:  sodium chloride     PRN Meds: sodium chloride, acetaminophen, ondansetron (ZOFRAN) IV, perflutren lipid microspheres (DEFINITY) IV suspension, sodium chloride flush, sodium chloride flush   Vital Signs    Vitals:   10/15/23 2308 10/16/23 0338 10/16/23 0731 10/16/23 1201  BP: 104/72 104/61 100/67 109/67  Pulse: 81 65 77 89  Resp: 18 19 18 18   Temp: 98.4 F (36.9 C) 98.3 F (36.8 C) 98 F (36.7 C) 98.3 F (36.8 C)  TempSrc: Oral Oral Oral Oral  SpO2: 95% 94% 98% 93%  Weight:      Height:        Intake/Output Summary (Last 24 hours) at 10/16/2023 1204 Last data filed at 10/16/2023 1100 Gross per 24 hour  Intake 500 ml  Output 1475 ml  Net -975 ml   Filed Weights   10/15/23 0533  Weight: 79.8 kg    Telemetry     Personally reviewed.  NSR  ECG    Not performed today  Physical Exam   GEN: No acute distress.   Neck: No JVD. Cardiac: RRR, no murmur, rub, or gallop.  Respiratory: Nonlabored. Clear to auscultation bilaterally. GI: Soft, nontender, bowel sounds present. MS: No edema; No deformity. Neuro:  Nonfocal. Psych: Alert and oriented x 3. Normal affect.  Labs    Chemistry Recent Labs  Lab 10/14/23 1128 10/15/23 0532 10/16/23 0334  NA 143 140 137  K 5.6* 4.3 3.9  CL 104 106 101  CO2 22 24 23   GLUCOSE 86 98 108*  BUN 10 10 14   CREATININE 0.98 0.94 0.99  CALCIUM 9.4 8.8* 8.8*  GFRNONAA  --  >60 >60  ANIONGAP   --  10 13     Hematology Recent Labs  Lab 10/14/23 1128  WBC 11.3*  RBC 4.89  HGB 14.5  HCT 44.6  MCV 91  MCH 29.7  MCHC 32.5  RDW 12.2  PLT 396    Cardiac EnzymesNo results for input(s): "TROPONINIHS" in the last 720 hours.  BNPNo results for input(s): "BNP", "PROBNP" in the last 168 hours.   DDimerNo results for input(s): "DDIMER" in the last 168 hours.   Radiology    [place] EKG SITE RITE Result Date: 10/15/2023 If Site Rite image not attached, placement could not be confirmed due to current cardiac rhythm.  CARDIAC CATHETERIZATION Result Date: 10/15/2023   Prox LAD to Mid LAD lesion is 100% stenosed.   Prox RCA to Mid RCA lesion is 80% stenosed.   Prox LAD lesion is 30% stenosed. 1.  Chronically occluded mid LAD stent. 2.  High-grade mid right RCA lesion. 3.  LVEDP of 43 mmHg with ventriculography demonstrating severe LV dysfunction. Recommendation: Given severe LV dysfunction and severely elevated LVEDP; 10 mg of Lasix IV x 1 was administered as the patient is Lasix nave.  The patient will be admitted to the hospital, echocardiography, diuretic therapy, and  medical optimization will be pursued prior to possible PCI of RCA disease.  Reviewed with Drs. McAlhany and [employer].    Assessment & Plan   Postinfarct angina/accelerating angina Acute diastolic heart failure HLD, not at goal Ischemic cardiomyopathy, pending echo (Ventricular free showed severe LV dysfunction)   -EKG showed anterolateral infarct, he had acute clinical event in December 2024 following which he has been having angina for which LHC was performed that showed chronically occluded mid LAD stent, high-grade mid LAD RCA lesion and LVEDP 43 mmHg.  Ventriculography demonstrated severe LV dysfunction.  Plan is to diurese and optimize medical management prior to RCA PCI next week. Continue IV Lasix 40 mg twice daily, start metoprolol succinate 12.5 mg once daily.  Addition/uptitration GDMT limited by soft BPs.   Echocardiogram is pending.  Continue cardioprotective medications, aspirin 81 mg daily and atorvastatin 80 mg nightly.  Signed, Marjo Bicker, MD  10/16/2023, 12:04 PM

## 2023-10-16 NOTE — Progress Notes (Signed)
Echocardiogram 2D Echocardiogram has been performed.  Lucendia Herrlich 10/16/2023, 11:55 AM

## 2023-10-17 LAB — BASIC METABOLIC PANEL
Anion gap: 10 (ref 5–15)
BUN: 16 mg/dL (ref 6–20)
CO2: 27 mmol/L (ref 22–32)
Calcium: 8.8 mg/dL — ABNORMAL LOW (ref 8.9–10.3)
Chloride: 99 mmol/L (ref 98–111)
Creatinine, Ser: 0.96 mg/dL (ref 0.61–1.24)
GFR, Estimated: >60 mL/min (ref >60)
Glucose, Bld: 112 mg/dL — ABNORMAL HIGH (ref 70–99)
Potassium: 3.7 mmol/L (ref 3.5–5.1)
Sodium: 136 mmol/L (ref 135–145)

## 2023-10-17 LAB — COOXEMETRY PANEL
Carboxyhemoglobin: 2 % — ABNORMAL HIGH (ref 0.5–1.5)
Methemoglobin: 0.8 % (ref 0.0–1.5)
O2 Saturation: 69.5 %
Total hemoglobin: 14.8 g/dL (ref 12.0–16.0)

## 2023-10-17 LAB — MAGNESIUM: Magnesium: 2.1 mg/dL (ref 1.7–2.4)

## 2023-10-17 MED ORDER — ASPIRIN 81 MG PO CHEW
81.0000 mg | CHEWABLE_TABLET | ORAL | Status: AC
  Administered 2023-10-18: 81 mg via ORAL

## 2023-10-17 MED ORDER — SODIUM CHLORIDE 0.9 % IV SOLN
INTRAVENOUS | Status: DC
  Administered 2023-10-18: 250 mL via INTRAVENOUS

## 2023-10-17 NOTE — Plan of Care (Signed)
  Problem: Activity: Goal: Ability to return to baseline activity level will improve Outcome: Progressing   Problem: Health Behavior/Discharge Planning: Goal: Ability to safely manage health-related needs after discharge will improve Outcome: Progressing   Problem: Education: Goal: Knowledge of General Education information will improve Description: Including pain rating scale, medication(s)/side effects and non-pharmacologic comfort measures Outcome: Progressing   Problem: Cardiovascular: Goal: Vascular access site(s) Level 0-1 will be maintained Outcome: Completed/Met   Problem: Clinical Measurements: Goal: Ability to maintain clinical measurements within normal limits will improve Outcome: Completed/Met   Problem: Activity: Goal: Risk for activity intolerance will decrease Outcome: Completed/Met   Problem: Nutrition: Goal: Adequate nutrition will be maintained Outcome: Completed/Met   Problem: Coping: Goal: Level of anxiety will decrease Outcome: Completed/Met   Problem: Elimination: Goal: Will not experience complications related to bowel motility Outcome: Completed/Met Goal: Will not experience complications related to urinary retention Outcome: Completed/Met   Problem: Pain Managment: Goal: General experience of comfort will improve and/or be controlled Outcome: Completed/Met

## 2023-10-17 NOTE — H&P (View-Only) (Signed)
Progress Note  Patient Name: Kenneth Medina. Date of Encounter: 10/17/2023  Primary Cardiologist: Tonny Bollman, MD  Subjective   No acute events overnight.  SOB improved.  Inpatient Medications    Scheduled Meds:  aspirin  81 mg Oral Daily   atorvastatin  80 mg Oral Daily   Chlorhexidine Gluconate Cloth  6 each Topical Daily   furosemide  40 mg Intravenous BID   metoprolol succinate  12.5 mg Oral Daily   sodium chloride flush  10-40 mL Intracatheter Q12H   sodium chloride flush  3 mL Intravenous Q12H   Continuous Infusions:   PRN Meds: acetaminophen, ondansetron (ZOFRAN) IV, sodium chloride flush, sodium chloride flush   Vital Signs    Vitals:   10/16/23 2007 10/17/23 0526 10/17/23 0536 10/17/23 0830  BP: 99/63 95/62  112/65  Pulse: 88   73  Resp: 17   20  Temp: 98.4 F (36.9 C) 98.3 F (36.8 C)  98.2 F (36.8 C)  TempSrc: Oral Oral  Oral  SpO2: 98% 98%    Weight:   74.8 kg   Height:        Intake/Output Summary (Last 24 hours) at 10/17/2023 1003 Last data filed at 10/17/2023 1610 Gross per 24 hour  Intake 600 ml  Output 1800 ml  Net -1200 ml   Filed Weights   10/15/23 0533 10/17/23 0536  Weight: 79.8 kg 74.8 kg    Telemetry     Personally reviewed.  NSR  ECG    Not performed today  Physical Exam   GEN: No acute distress.   Neck: No JVD. Cardiac: RRR, no murmur, rub, or gallop.  Respiratory: Nonlabored. Clear to auscultation bilaterally. GI: Soft, nontender, bowel sounds present. MS: No edema; No deformity. Neuro:  Nonfocal. Psych: Alert and oriented x 3. Normal affect.  Labs    Chemistry Recent Labs  Lab 10/15/23 0532 10/16/23 0334 10/17/23 0538  NA 140 137 136  K 4.3 3.9 3.7  CL 106 101 99  CO2 24 23 27   GLUCOSE 98 108* 112*  BUN 10 14 16   CREATININE 0.94 0.99 0.96  CALCIUM 8.8* 8.8* 8.8*  GFRNONAA >60 >60 >60  ANIONGAP 10 13 10      Hematology Recent Labs  Lab 10/14/23 1128  WBC 11.3*  RBC 4.89  HGB 14.5   HCT 44.6  MCV 91  MCH 29.7  MCHC 32.5  RDW 12.2  PLT 396    Cardiac EnzymesNo results for input(s): "TROPONINIHS" in the last 720 hours.  BNPNo results for input(s): "BNP", "PROBNP" in the last 168 hours.   DDimerNo results for input(s): "DDIMER" in the last 168 hours.   Radiology    ECHOCARDIOGRAM COMPLETE Result Date: 10/16/2023    ECHOCARDIOGRAM REPORT   Patient Name:   Kenneth Medina. Date of Exam: 10/16/2023 Medical Rec #:  [id number]              Height:       69.0 in Accession #:    [id number]             Weight:       176.0 lb Date of Birth:  1971/07/23               BSA:          1.957 m Patient Age:    52 years               BP:  100/67 mmHg Patient Gender: M                      HR:           80 bpm. Exam Location:  Inpatient Procedure: 2D Echo, Cardiac Doppler, Color Doppler and Intracardiac            Opacification Agent Indications:    CHF - Acute Systolic I50.9  History:        Patient has prior history of Echocardiogram examinations, most                 recent 02/16/2022. CHF and Cardiomyopathy, CAD; Risk                 Factors:Hypertension, Sleep Apnea, Current Smoker and                 Dyslipidemia.  Sonographer:    Lucendia Herrlich RCS Referring Phys: [id number] Orbie Pyo IMPRESSIONS  1. Swirling of contrast in the LV apex but no evidence of LV thrombus. Left ventricular ejection fraction, by estimation, is 30 to 35%. The left ventricle has moderately decreased function. The left ventricle demonstrates regional wall motion abnormalities in a pattern consistent with LAD infarction versus Takostubo cardiomyopathy. Left ventricular diastolic parameters are consistent with Grade I diastolic dysfunction (impaired relaxation).  2. Right ventricular systolic function is normal. The right ventricular size is normal. Tricuspid regurgitation signal is inadequate for assessing PA pressure.  3. The mitral valve is normal in structure. Trivial mitral valve regurgitation. No  evidence of mitral stenosis.  4. The aortic valve is normal in structure. Aortic valve regurgitation is not visualized. No aortic stenosis is present.  5. Aortic dilatation noted. There is borderline dilatation of the aortic root, measuring 39 mm.  6. The inferior vena cava is normal in size with greater than 50% respiratory variability, suggesting right atrial pressure of 3 mmHg. Comparison(s): Changes from prior study are noted. LVEF worsened from normal to 30-35% with RWMA now. FINDINGS  Left Ventricle: Swirling of contrast in the apex but no evidence of LV thrombus. Left ventricular ejection fraction, by estimation, is 30 to 35%. The left ventricle has moderately decreased function. The left ventricle demonstrates regional wall motion abnormalities. Definity contrast agent was given IV to delineate the left ventricular endocardial borders. The left ventricular internal cavity size was normal in size. There is no left ventricular hypertrophy. Left ventricular diastolic parameters are consistent with Grade I diastolic dysfunction (impaired relaxation). Normal left ventricular filling pressure.  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 The mid inferolateral segment is akinetic. The basal inferolateral segment, mid anterolateral segment, mid inferoseptal segment, mid anterior segment, and mid inferior segment are hypokinetic. The basal anterolateral segment, basal anterior segment, basal inferior segment, and basal inferoseptal segment are normal. Right Ventricle: The right ventricular size is normal. No increase in right ventricular wall thickness. Right ventricular systolic function is normal. Tricuspid regurgitation signal is inadequate for assessing PA pressure. Left Atrium: Left atrial size was normal in size. Right Atrium: Right atrial size was normal in size. Pericardium: There is no evidence of pericardial effusion. Mitral Valve: The mitral valve is normal in structure.  Trivial mitral valve regurgitation. No evidence of mitral valve stenosis. Tricuspid Valve: The tricuspid valve is normal in structure. Tricuspid valve regurgitation is not demonstrated. No evidence of tricuspid stenosis. Aortic Valve: The aortic valve is normal in structure. Aortic valve regurgitation is not  visualized. No aortic stenosis is present. Aortic valve peak gradient measures 3.2 mmHg. Pulmonic Valve: The pulmonic valve was normal in structure. Pulmonic valve regurgitation is not visualized. No evidence of pulmonic stenosis. Aorta: Aortic dilatation noted. There is borderline dilatation of the aortic root, measuring 39 mm. Venous: The inferior vena cava is normal in size with greater than 50% respiratory variability, suggesting right atrial pressure of 3 mmHg. IAS/Shunts: No atrial level shunt detected by color flow Doppler.  LEFT VENTRICLE PLAX 2D LVIDd:         4.90 cm   Diastology LVIDs:         3.50 cm   LV e' medial:    10.50 cm/s LV PW:         1.10 cm   LV E/e' medial:  4.7 LV IVS:        1.10 cm   LV e' lateral:   9.08 cm/s LVOT diam:     2.20 cm   LV E/e' lateral: 5.5 LV SV:         39 LV SV Index:   20 LVOT Area:     3.80 cm  RIGHT VENTRICLE             IVC RV S prime:     11.70 cm/s  IVC diam: 1.30 cm TAPSE (M-mode): 1.9 cm LEFT ATRIUM             Index        RIGHT ATRIUM           Index LA diam:        3.30 cm 1.69 cm/m   RA Area:     12.60 cm LA Vol (A2C):   33.7 ml 17.22 ml/m  RA Volume:   29.50 ml  15.08 ml/m LA Vol (A4C):   24.4 ml 12.47 ml/m LA Biplane Vol: 28.6 ml 14.62 ml/m  AORTIC VALVE AV Area (Vmax): 2.94 cm AV Vmax:        89.10 cm/s AV Peak Grad:   3.2 mmHg LVOT Vmax:      68.97 cm/s LVOT Vmean:     43.867 cm/s LVOT VTI:       0.101 m  AORTA Ao Root diam: 3.90 cm Ao Asc diam:  3.50 cm MITRAL VALVE MV Area (PHT): 3.99 cm    SHUNTS MV Decel Time: 190 msec    Systemic VTI:  0.10 m MV E velocity: 49.60 cm/s  Systemic Diam: 2.20 cm MV A velocity: 86.40 cm/s MV E/A ratio:  0.57  Naama Sappington Priya Avya Flavell Electronically signed by Winfield Rast Koa Zoeller Signature Date/Time: 10/16/2023/2:35:32 PM    Final    [place] EKG SITE RITE Result Date: 10/15/2023 If Site Rite image not attached, placement could not be confirmed due to current cardiac rhythm.   Assessment & Plan   Postinfarct angina/accelerating angina Acute diastolic heart failure HLD, not at goal Ischemic cardiomyopathy, pending echo (Ventricular free showed severe LV dysfunction)   -EKG showed anterolateral infarct, he had acute clinical event in December 2024 following which he has been having angina for which LHC was performed that showed chronically occluded mid LAD stent, high-grade mid LAD RCA lesion and LVEDP 43 mmHg.  Ventriculography demonstrated severe LV dysfunction.  Plan is to diurese and optimize medical management prior to RCA PCI next week.  Echocardiogram yesterday showed LVEF 30 to 35% with RWMA consistent with LAD infarction versus Takotsubo cardiomyopathy.  SOB improved, no leg swelling.  Continue IV Lasix 40 mg twice  daily, continue metoprolol succinate 12.5 mg once daily.  Addition/uptitration of GDMT limited by soft BPs.  Continue cardioprotective medications, aspirin 81 mg once daily and atorvastatin 80 mg nightly.  I think patient should be volume optimized by tomorrow for [facility] with RCA PCI.  Informed consent for LHC Risks and benefits of cardiac catheterization have been discussed with the patient.  These include bleeding, infection, kidney damage, stroke, heart attack, death.  The patient understands these risks and is willing to proceed.   Signed, Mick Tanguma Demetrius Charity Jadalyn Oliveri, MD  10/17/2023, 10:03 AM

## 2023-10-17 NOTE — Progress Notes (Signed)
Progress Note  Patient Name: Kenneth Medina. Date of Encounter: 10/17/2023  Primary Cardiologist: Tonny Bollman, MD  Subjective   No acute events overnight.  SOB improved.  Inpatient Medications    Scheduled Meds:  aspirin  81 mg Oral Daily   atorvastatin  80 mg Oral Daily   Chlorhexidine Gluconate Cloth  6 each Topical Daily   furosemide  40 mg Intravenous BID   metoprolol succinate  12.5 mg Oral Daily   sodium chloride flush  10-40 mL Intracatheter Q12H   sodium chloride flush  3 mL Intravenous Q12H   Continuous Infusions:   PRN Meds: acetaminophen, ondansetron (ZOFRAN) IV, sodium chloride flush, sodium chloride flush   Vital Signs    Vitals:   10/16/23 2007 10/17/23 0526 10/17/23 0536 10/17/23 0830  BP: 99/63 95/62  112/65  Pulse: 88   73  Resp: 17   20  Temp: 98.4 F (36.9 C) 98.3 F (36.8 C)  98.2 F (36.8 C)  TempSrc: Oral Oral  Oral  SpO2: 98% 98%    Weight:   74.8 kg   Height:        Intake/Output Summary (Last 24 hours) at 10/17/2023 1003 Last data filed at 10/17/2023 1610 Gross per 24 hour  Intake 600 ml  Output 1800 ml  Net -1200 ml   Filed Weights   10/15/23 0533 10/17/23 0536  Weight: 79.8 kg 74.8 kg    Telemetry     Personally reviewed.  NSR  ECG    Not performed today  Physical Exam   GEN: No acute distress.   Neck: No JVD. Cardiac: RRR, no murmur, rub, or gallop.  Respiratory: Nonlabored. Clear to auscultation bilaterally. GI: Soft, nontender, bowel sounds present. MS: No edema; No deformity. Neuro:  Nonfocal. Psych: Alert and oriented x 3. Normal affect.  Labs    Chemistry Recent Labs  Lab 10/15/23 0532 10/16/23 0334 10/17/23 0538  NA 140 137 136  K 4.3 3.9 3.7  CL 106 101 99  CO2 24 23 27   GLUCOSE 98 108* 112*  BUN 10 14 16   CREATININE 0.94 0.99 0.96  CALCIUM 8.8* 8.8* 8.8*  GFRNONAA >60 >60 >60  ANIONGAP 10 13 10      Hematology Recent Labs  Lab 10/14/23 1128  WBC 11.3*  RBC 4.89  HGB 14.5   HCT 44.6  MCV 91  MCH 29.7  MCHC 32.5  RDW 12.2  PLT 396    Cardiac EnzymesNo results for input(s): "TROPONINIHS" in the last 720 hours.  BNPNo results for input(s): "BNP", "PROBNP" in the last 168 hours.   DDimerNo results for input(s): "DDIMER" in the last 168 hours.   Radiology    ECHOCARDIOGRAM COMPLETE Result Date: 10/16/2023    ECHOCARDIOGRAM REPORT   Patient Name:   Kenneth Medina. Date of Exam: 10/16/2023 Medical Rec #:  [id number]              Height:       69.0 in Accession #:    [id number]             Weight:       176.0 lb Date of Birth:  1971/07/23               BSA:          1.957 m Patient Age:    52 years               BP:  100/67 mmHg Patient Gender: M                      HR:           80 bpm. Exam Location:  Inpatient Procedure: 2D Echo, Cardiac Doppler, Color Doppler and Intracardiac            Opacification Agent Indications:    CHF - Acute Systolic I50.9  History:        Patient has prior history of Echocardiogram examinations, most                 recent 02/16/2022. CHF and Cardiomyopathy, CAD; Risk                 Factors:Hypertension, Sleep Apnea, Current Smoker and                 Dyslipidemia.  Sonographer:    Lucendia Herrlich RCS Referring Phys: [id number] Orbie Pyo IMPRESSIONS  1. Swirling of contrast in the LV apex but no evidence of LV thrombus. Left ventricular ejection fraction, by estimation, is 30 to 35%. The left ventricle has moderately decreased function. The left ventricle demonstrates regional wall motion abnormalities in a pattern consistent with LAD infarction versus Takostubo cardiomyopathy. Left ventricular diastolic parameters are consistent with Grade I diastolic dysfunction (impaired relaxation).  2. Right ventricular systolic function is normal. The right ventricular size is normal. Tricuspid regurgitation signal is inadequate for assessing PA pressure.  3. The mitral valve is normal in structure. Trivial mitral valve regurgitation. No  evidence of mitral stenosis.  4. The aortic valve is normal in structure. Aortic valve regurgitation is not visualized. No aortic stenosis is present.  5. Aortic dilatation noted. There is borderline dilatation of the aortic root, measuring 39 mm.  6. The inferior vena cava is normal in size with greater than 50% respiratory variability, suggesting right atrial pressure of 3 mmHg. Comparison(s): Changes from prior study are noted. LVEF worsened from normal to 30-35% with RWMA now. FINDINGS  Left Ventricle: Swirling of contrast in the apex but no evidence of LV thrombus. Left ventricular ejection fraction, by estimation, is 30 to 35%. The left ventricle has moderately decreased function. The left ventricle demonstrates regional wall motion abnormalities. Definity contrast agent was given IV to delineate the left ventricular endocardial borders. The left ventricular internal cavity size was normal in size. There is no left ventricular hypertrophy. Left ventricular diastolic parameters are consistent with Grade I diastolic dysfunction (impaired relaxation). Normal left ventricular filling pressure.  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 The mid inferolateral segment is akinetic. The basal inferolateral segment, mid anterolateral segment, mid inferoseptal segment, mid anterior segment, and mid inferior segment are hypokinetic. The basal anterolateral segment, basal anterior segment, basal inferior segment, and basal inferoseptal segment are normal. Right Ventricle: The right ventricular size is normal. No increase in right ventricular wall thickness. Right ventricular systolic function is normal. Tricuspid regurgitation signal is inadequate for assessing PA pressure. Left Atrium: Left atrial size was normal in size. Right Atrium: Right atrial size was normal in size. Pericardium: There is no evidence of pericardial effusion. Mitral Valve: The mitral valve is normal in structure.  Trivial mitral valve regurgitation. No evidence of mitral valve stenosis. Tricuspid Valve: The tricuspid valve is normal in structure. Tricuspid valve regurgitation is not demonstrated. No evidence of tricuspid stenosis. Aortic Valve: The aortic valve is normal in structure. Aortic valve regurgitation is not  visualized. No aortic stenosis is present. Aortic valve peak gradient measures 3.2 mmHg. Pulmonic Valve: The pulmonic valve was normal in structure. Pulmonic valve regurgitation is not visualized. No evidence of pulmonic stenosis. Aorta: Aortic dilatation noted. There is borderline dilatation of the aortic root, measuring 39 mm. Venous: The inferior vena cava is normal in size with greater than 50% respiratory variability, suggesting right atrial pressure of 3 mmHg. IAS/Shunts: No atrial level shunt detected by color flow Doppler.  LEFT VENTRICLE PLAX 2D LVIDd:         4.90 cm   Diastology LVIDs:         3.50 cm   LV e' medial:    10.50 cm/s LV PW:         1.10 cm   LV E/e' medial:  4.7 LV IVS:        1.10 cm   LV e' lateral:   9.08 cm/s LVOT diam:     2.20 cm   LV E/e' lateral: 5.5 LV SV:         39 LV SV Index:   20 LVOT Area:     3.80 cm  RIGHT VENTRICLE             IVC RV S prime:     11.70 cm/s  IVC diam: 1.30 cm TAPSE (M-mode): 1.9 cm LEFT ATRIUM             Index        RIGHT ATRIUM           Index LA diam:        3.30 cm 1.69 cm/m   RA Area:     12.60 cm LA Vol (A2C):   33.7 ml 17.22 ml/m  RA Volume:   29.50 ml  15.08 ml/m LA Vol (A4C):   24.4 ml 12.47 ml/m LA Biplane Vol: 28.6 ml 14.62 ml/m  AORTIC VALVE AV Area (Vmax): 2.94 cm AV Vmax:        89.10 cm/s AV Peak Grad:   3.2 mmHg LVOT Vmax:      68.97 cm/s LVOT Vmean:     43.867 cm/s LVOT VTI:       0.101 m  AORTA Ao Root diam: 3.90 cm Ao Asc diam:  3.50 cm MITRAL VALVE MV Area (PHT): 3.99 cm    SHUNTS MV Decel Time: 190 msec    Systemic VTI:  0.10 m MV E velocity: 49.60 cm/s  Systemic Diam: 2.20 cm MV A velocity: 86.40 cm/s MV E/A ratio:  0.57  Naama Sappington Priya Avya Flavell Electronically signed by Winfield Rast Koa Zoeller Signature Date/Time: 10/16/2023/2:35:32 PM    Final    [place] EKG SITE RITE Result Date: 10/15/2023 If Site Rite image not attached, placement could not be confirmed due to current cardiac rhythm.   Assessment & Plan   Postinfarct angina/accelerating angina Acute diastolic heart failure HLD, not at goal Ischemic cardiomyopathy, pending echo (Ventricular free showed severe LV dysfunction)   -EKG showed anterolateral infarct, he had acute clinical event in December 2024 following which he has been having angina for which LHC was performed that showed chronically occluded mid LAD stent, high-grade mid LAD RCA lesion and LVEDP 43 mmHg.  Ventriculography demonstrated severe LV dysfunction.  Plan is to diurese and optimize medical management prior to RCA PCI next week.  Echocardiogram yesterday showed LVEF 30 to 35% with RWMA consistent with LAD infarction versus Takotsubo cardiomyopathy.  SOB improved, no leg swelling.  Continue IV Lasix 40 mg twice  daily, continue metoprolol succinate 12.5 mg once daily.  Addition/uptitration of GDMT limited by soft BPs.  Continue cardioprotective medications, aspirin 81 mg once daily and atorvastatin 80 mg nightly.  I think patient should be volume optimized by tomorrow for [facility] with RCA PCI.  Informed consent for LHC Risks and benefits of cardiac catheterization have been discussed with the patient.  These include bleeding, infection, kidney damage, stroke, heart attack, death.  The patient understands these risks and is willing to proceed.   Signed, Mick Tanguma Demetrius Charity Jadalyn Oliveri, MD  10/17/2023, 10:03 AM

## 2023-10-18 ENCOUNTER — Other Ambulatory Visit: Payer: Self-pay

## 2023-10-18 ENCOUNTER — Encounter (HOSPITAL_COMMUNITY)
Admission: AD | Disposition: A | Discharge status: Home / Self Care | Payer: Self-pay | Source: Home / Self Care | Attending: Internal Medicine

## 2023-10-18 ENCOUNTER — Encounter (HOSPITAL_COMMUNITY): Payer: Self-pay | Admitting: Internal Medicine

## 2023-10-18 ENCOUNTER — Other Ambulatory Visit (HOSPITAL_COMMUNITY): Payer: Self-pay

## 2023-10-18 DIAGNOSIS — I25110 Atherosclerotic heart disease of native coronary artery with unstable angina pectoris: Secondary | ICD-10-CM

## 2023-10-18 DIAGNOSIS — I110 Hypertensive heart disease with heart failure: Secondary | ICD-10-CM | POA: Diagnosis not present

## 2023-10-18 DIAGNOSIS — I237 Postinfarction angina: Secondary | ICD-10-CM | POA: Diagnosis not present

## 2023-10-18 DIAGNOSIS — I25118 Atherosclerotic heart disease of native coronary artery with other forms of angina pectoris: Secondary | ICD-10-CM | POA: Diagnosis not present

## 2023-10-18 DIAGNOSIS — I5043 Acute on chronic combined systolic (congestive) and diastolic (congestive) heart failure: Secondary | ICD-10-CM | POA: Diagnosis not present

## 2023-10-18 HISTORY — PX: CORONARY STENT INTERVENTION: CATH118234

## 2023-10-18 LAB — BASIC METABOLIC PANEL
Anion gap: 9 (ref 5–15)
BUN: 15 mg/dL (ref 6–20)
CO2: 28 mmol/L (ref 22–32)
Calcium: 8.6 mg/dL — ABNORMAL LOW (ref 8.9–10.3)
Chloride: 99 mmol/L (ref 98–111)
Creatinine, Ser: 0.98 mg/dL (ref 0.61–1.24)
GFR, Estimated: >60 mL/min (ref >60)
Glucose, Bld: 100 mg/dL — ABNORMAL HIGH (ref 70–99)
Potassium: 3.9 mmol/L (ref 3.5–5.1)
Sodium: 136 mmol/L (ref 135–145)

## 2023-10-18 LAB — POCT ACTIVATED CLOTTING TIME
Activated Clotting Time: 366 s
Activated Clotting Time: 147 s

## 2023-10-18 LAB — CBC
HCT: 43.1 % (ref 39.0–52.0)
Hemoglobin: 14.3 g/dL (ref 13.0–17.0)
MCH: 29.8 pg (ref 26.0–34.0)
MCHC: 33.2 g/dL (ref 30.0–36.0)
MCV: 89.8 fL (ref 80.0–100.0)
Platelets: 304 10*3/uL (ref 150–400)
RBC: 4.8 MIL/uL (ref 4.22–5.81)
RDW: 12.4 % (ref 11.5–15.5)
WBC: 11.2 10*3/uL — ABNORMAL HIGH (ref 4.0–10.5)
nRBC: 0 % (ref 0.0–0.2)

## 2023-10-18 LAB — LIPOPROTEIN A (LPA): Lipoprotein (a): 53.6 nmol/L — ABNORMAL HIGH (ref <75.0)

## 2023-10-18 LAB — MAGNESIUM: Magnesium: 2.1 mg/dL (ref 1.7–2.4)

## 2023-10-18 SURGERY — CORONARY STENT INTERVENTION
Anesthesia: LOCAL

## 2023-10-18 MED ORDER — SODIUM CHLORIDE 0.9 % IV SOLN: 250.0000 mL | INTRAVENOUS | Status: DC | PRN

## 2023-10-18 MED ORDER — HEPARIN SODIUM (PORCINE) 1000 UNIT/ML IJ SOLN
INTRAMUSCULAR | Status: AC
  Filled 2023-10-18: qty 20

## 2023-10-18 MED ORDER — NITROGLYCERIN 1 MG/10 ML FOR IR/CATH LAB
INTRA_ARTERIAL | Status: DC | PRN
  Administered 2023-10-18: 200 ug via INTRACORONARY

## 2023-10-18 MED ORDER — TICAGRELOR 90 MG PO TABS
180.0000 mg | ORAL_TABLET | Freq: Once | ORAL | Status: AC
  Administered 2023-10-18: 180 mg via ORAL
  Filled 2023-10-18: qty 2

## 2023-10-18 MED ORDER — SODIUM CHLORIDE 0.9% FLUSH
3.0000 mL | INTRAVENOUS | Status: DC | PRN
Start: 2023-10-18 — End: 2023-10-19

## 2023-10-18 MED ORDER — SODIUM CHLORIDE 0.9 % IV SOLN: INTRAVENOUS | Status: AC

## 2023-10-18 MED ORDER — MORPHINE SULFATE (PF) 2 MG/ML IV SOLN: 2.0000 mg | INTRAVENOUS | Status: DC | PRN

## 2023-10-18 MED ORDER — FENTANYL CITRATE (PF) 100 MCG/2ML IJ SOLN
INTRAMUSCULAR | Status: AC
  Filled 2023-10-18: qty 2

## 2023-10-18 MED ORDER — LIDOCAINE HCL (PF) 1 % IJ SOLN
INTRAMUSCULAR | Status: AC
  Filled 2023-10-18: qty 30

## 2023-10-18 MED ORDER — FENTANYL CITRATE (PF) 100 MCG/2ML IJ SOLN
INTRAMUSCULAR | Status: DC | PRN
  Administered 2023-10-18: 25 ug via INTRAVENOUS

## 2023-10-18 MED ORDER — MIDAZOLAM HCL 2 MG/2ML IJ SOLN
INTRAMUSCULAR | Status: AC
  Filled 2023-10-18: qty 2

## 2023-10-18 MED ORDER — VERAPAMIL HCL 2.5 MG/ML IV SOLN
INTRAVENOUS | Status: AC
  Filled 2023-10-18: qty 2

## 2023-10-18 MED ORDER — MIDAZOLAM HCL 2 MG/2ML IJ SOLN
INTRAMUSCULAR | Status: DC | PRN
  Administered 2023-10-18: 1 mg via INTRAVENOUS

## 2023-10-18 MED ORDER — SODIUM CHLORIDE 0.9% FLUSH: 3.0000 mL | Freq: Two times a day (BID) | INTRAVENOUS | Status: DC

## 2023-10-18 MED ORDER — HEPARIN (PORCINE) IN NACL 1000-0.9 UT/500ML-% IV SOLN
INTRAVENOUS | Status: DC | PRN
  Administered 2023-10-18 (×2): 500 mL

## 2023-10-18 MED ORDER — NITROGLYCERIN 1 MG/10 ML FOR IR/CATH LAB
INTRA_ARTERIAL | Status: AC
  Filled 2023-10-18: qty 10

## 2023-10-18 MED ORDER — HEPARIN SODIUM (PORCINE) 1000 UNIT/ML IJ SOLN
INTRAMUSCULAR | Status: DC | PRN
  Administered 2023-10-18: 7000 [IU] via INTRAVENOUS

## 2023-10-18 MED ORDER — EMPAGLIFLOZIN 10 MG PO TABS
10.0000 mg | ORAL_TABLET | Freq: Every day | ORAL | Status: DC
  Administered 2023-10-18 – 2023-10-19 (×2): 10 mg via ORAL
  Filled 2023-10-18 (×2): qty 1

## 2023-10-18 MED ORDER — VERAPAMIL HCL 2.5 MG/ML IV SOLN
INTRAVENOUS | Status: DC | PRN
  Administered 2023-10-18: 10 mL via INTRA_ARTERIAL

## 2023-10-18 MED ORDER — LIDOCAINE HCL (PF) 1 % IJ SOLN
INTRAMUSCULAR | Status: DC | PRN
  Administered 2023-10-18: 10 mL
  Administered 2023-10-18: 2 mL

## 2023-10-18 MED ORDER — LABETALOL HCL 5 MG/ML IV SOLN: 10.0000 mg | INTRAVENOUS | Status: AC | PRN

## 2023-10-18 MED ORDER — IOHEXOL 350 MG/ML SOLN
INTRAVENOUS | Status: DC | PRN
  Administered 2023-10-18: 45 mL

## 2023-10-18 MED ORDER — TICAGRELOR 90 MG PO TABS
90.0000 mg | ORAL_TABLET | Freq: Two times a day (BID) | ORAL | Status: DC
  Administered 2023-10-18 – 2023-10-19 (×2): 90 mg via ORAL
  Filled 2023-10-18 (×2): qty 1

## 2023-10-18 MED ORDER — HYDRALAZINE HCL 20 MG/ML IJ SOLN: 10.0000 mg | INTRAMUSCULAR | Status: AC | PRN

## 2023-10-18 SURGICAL SUPPLY — 20 items
BALLN EMERGE MR 3.0X12 (BALLOONS) ×1
BALLN ~~LOC~~ EMERGE MR 4.5X12 (BALLOONS) ×1
BALLOON EMERGE MR 3.0X12 (BALLOONS) IMPLANT
BALLOON ~~LOC~~ EMERGE MR 4.5X12 (BALLOONS) IMPLANT
CATH LAUNCHER 6FR JR4 (CATHETERS) IMPLANT
DEVICE RAD COMP TR BAND LRG (VASCULAR PRODUCTS) IMPLANT
ELECT DEFIB PAD ADLT CADENCE (PAD) IMPLANT
GLIDESHEATH SLEND SS 6F .021 (SHEATH) IMPLANT
GUIDEWIRE INQWIRE 1.5J.035X260 (WIRE) IMPLANT
INQWIRE 1.5J .035X260CM (WIRE) ×1
KIT ENCORE 26 ADVANTAGE (KITS) IMPLANT
PACK CARDIAC CATHETERIZATION (CUSTOM PROCEDURE TRAY) ×1 IMPLANT
SET ATX-X65L (MISCELLANEOUS) IMPLANT
SHEATH PINNACLE 6F 10CM (SHEATH) IMPLANT
SHEATH PROBE COVER 6X72 (BAG) IMPLANT
STENT ONYX FRONTIER 4.0X15 (Permanent Stent) IMPLANT
TUBING CIL FLEX 10 FLL-RA (TUBING) IMPLANT
WIRE ASAHI PROWATER 180CM (WIRE) IMPLANT
WIRE HI TORQ VERSACORE-J 145CM (WIRE) IMPLANT
WIRE MICRO SET SILHO 5FR 7 (SHEATH) IMPLANT

## 2023-10-18 NOTE — Progress Notes (Signed)
SITE AREA: right groin/femoral  SITE PRIOR TO REMOVAL:  LEVEL 0  PRESSURE APPLIED FOR: approximately 30 minutes  MANUAL: yes, removed by Molli Hazard, RN, 2H nurse  PATIENT STATUS DURING PULL: stable  POST PULL SITE:  LEVEL 0  POST PULL INSTRUCTIONS GIVEN: yes, drsg x 24 hours, may shower tomorrow, no sitting in water, hot tubs, bath tubs, or pools, take it easy on stairs and climbing into and out of vehicles, if at home and bleeding occurs, call 911 and let them evaluate  POST PULL PULSES PRESENT: bilateral pedal pulses at +1  DRESSING APPLIED: gauze with tegaderm  BEDREST BEGINS @ 1325  COMMENTS:

## 2023-10-18 NOTE — Progress Notes (Incomplete)
Heart Failure Nurse Navigator Progress Note  PCP: Pcp, No PCP-Cardiologist: Cooper Admission Diagnosis: None  Admitted from: Home  Presentation:   Kenneth Medina. presented to outpatient cardiology on 1/30 with chest pain complaints, had a left heart cath on 1/31 which showed a chronically occluded mid LAD stent, patient was admitted to hospital for diuresis, planned PCI to RCA. New Echo showed LVEF 30-35%.   Patient was educated on the sign and symptoms of heart failure, daily weights, when to call his doctor or go to the ED, Diet/ fluid restrictions, taking all medications as prescribed and attending all medical appointments. Patient verbalized his understanding, a HF TOC appointment was scheduled at [facility] for 10/26/2023 @ 10:30 am.     ECHO/ LVEF: 30-35%  Clinical Course:  Past Medical History:  Diagnosis Date   Coronary artery disease    a. s/p ant STEMI 5/15 >> LHC (5/15):  prox D1 30-40%, LAD occluded, prox PDA 20-30%, mid to dist Ant HK, EF 45% >>> PCI:  2.75x22 mm Resolute DES to mid LAD   Hyperlipidemia    Hypertension    Ischemic cardiomyopathy    a. EF 45% at [facility] at time of MI >> b. Echo (5/15):  Apical HK, no clot, EF 50%, mod LVH, normal RVF   MI (myocardial infarction) (HCC)    2015   Tobacco abuse      Social History   Socioeconomic History   Marital status: Married    Spouse name: Not on file   Number of children: Not on file   Years of education: Not on file   Highest education level: Not on file  Occupational History   Not on file  Tobacco Use   Smoking status: Every Day    Current packs/day: 0.50    Average packs/day: 0.5 packs/day for 30.0 years (15.0 ttl pk-yrs)    Types: Cigarettes   Smokeless tobacco: Never  Vaping Use   Vaping status: Never Used  Substance and Sexual Activity   Alcohol use: No   Drug use: No   Sexual activity: Not on file  Other Topics Concern   Not on file  Social History Narrative   The patient is married. He  works as a Surveyor, [employer] for [employer]. He does not drink alcohol or use illicit drugs. He does smoke cigarettes one half pack per day.   Social Drivers of Corporate investment banker Strain: Not on file  Food Insecurity: No Food Insecurity (10/15/2023)   Hunger Vital Sign    Worried About Running Out of Food in the Last Year: Never true    Ran Out of Food in the Last Year: Never true  Transportation Needs: No Transportation Needs (10/15/2023)   PRAPARE - Administrator, Civil Service (Medical): No    Lack of Transportation (Non-Medical): No  Physical Activity: Not on file  Stress: Not on file  Social Connections: Not on file   Education Assessment and Provision:  Detailed education and instructions provided on heart failure disease management including the following:  Signs and symptoms of Heart Failure When to call the physician Importance of daily weights Low sodium diet Fluid restriction Medication management Anticipated future follow-up appointments  Patient education given on each of the above topics.  Patient acknowledges understanding via teach back method and acceptance of all instructions.  Education Materials:  "Living Better With Heart Failure" Booklet, HF zone tool, & Daily Weight Tracker Tool.  Patient has scale at home:  Wife is buying one today Patient has pill box at home: yes    High Risk Criteria for Readmission and/or Poor Patient Outcomes: Heart failure hospital admissions (last 6 months): 0  No Show rate: 23% Difficult social situation: Lives with his wife Demonstrates medication adherence: No, would frequently forget to take medications  Primary Language: English Literacy level: Reading, writing, and comprehension  Barriers of Care:   Diet/ fluid restrictions Daily weights  Considerations/Referrals:   Referral made to Heart Failure Pharmacist Stewardship: Yes Referral made to Heart Failure CSW/NCM TOC: No Referral made to Heart &  Vascular TOC clinic: Yes, [facility] 10/26/2023 @ 10:30 am.   Items for Follow-up on DC/TOC: Continued HF education Diet/ fluid restrictions/ daily weights   Rhae Hammock, BSN, RN Heart Failure Teacher, adult education Only

## 2023-10-18 NOTE — Progress Notes (Signed)
LOCATION: right RADIAL  DEFLATED PER PROTOCOL: YES  TIME BAND OFF/DRESSING APPLIED: 1312, gauze with tegaderm  SITE UPON ARRIVAL: LEVEL 0  SITE AFTER BAND REMOVAL: LEVEL 0  CIRCULATION SENSATION AND MOVEMENT:    bilateral radial pulses at +2  COMMENTS:

## 2023-10-18 NOTE — TOC Initial Note (Signed)
Transition of Care (TOC) - Initial/Assessment Note    Patient Details  Name: Kenneth Medina. MRN: [id number] Date of Birth: 19-Jul-1971  Transition of Care [facility]) CM/SW Contact:    Jessie Foot, RN Phone Number: 10/18/2023, 3:48 PM  Clinical Narrative:                 Patient presented for chest pain. PTA patient was from home with spouse Dois Davenport), and mother in-law Stanton Kidney). Patient states was independent, still drives, and take self to appointments. Case Manager discussed DME and home needs. Patient denies home needs. Patient states VA medical insurance was recently approved (VA notified of admission) and does not have a PCP. No needs identified during this visit. Medications will be $0. Case Manager will continue to follow for disposition needs as the patient progresses.  Expected Discharge Plan: Home/Self Care Barriers to Discharge: No Barriers Identified   Patient Goals and CMS Choice Patient states their goals for this hospitalization and ongoing recovery are:: Return to home   Choice offered to / list presented to : NA      Expected Discharge Plan and Services In-house Referral: NA Discharge Planning Services: CM Consult   Living arrangements for the past 2 months: Single Family Home                           HH Arranged: NA          Prior Living Arrangements/Services Living arrangements for the past 2 months: Single Family Home Lives with:: Spouse, Other (Comment) (Mother In-law) Patient language and need for interpreter reviewed:: Yes Do you feel safe going back to the place where you live?: Yes      Need for Family Participation in Patient Care: No (Comment) Care giver support system in place?: Yes (comment)   Criminal Activity/Legal Involvement Pertinent to Current Situation/Hospitalization: No - Comment as needed  Activities of Daily Living   ADL Screening (condition at time of admission) Independently performs ADLs?: Yes (appropriate for  developmental age) Is the patient deaf or have difficulty hearing?: No Does the patient have difficulty seeing, even when wearing glasses/contacts?: No Does the patient have difficulty concentrating, remembering, or making decisions?: No  Permission Sought/Granted Permission sought to share information with : Family Supports Permission granted to share information with : Yes, Verbal Permission Granted              Emotional Assessment Appearance:: Appears older than stated age Attitude/Demeanor/Rapport: Engaged Affect (typically observed): Appropriate Orientation: : Oriented to Self, Oriented to Place, Oriented to  Time, Oriented to Situation Alcohol / Substance Use: Tobacco Use Psych Involvement: No (comment)  Admission diagnosis:  Acute on chronic systolic (congestive) heart failure (HCC) [I50.23] Patient Active Problem List   Diagnosis Date Noted   Acute on chronic systolic (congestive) heart failure (HCC) 10/15/2023   Essential hypertension 05/31/2016   Mild sleep apnea 05/31/2016   Hyperlipidemia 02/12/2014   Cardiomyopathy, ischemic 02/12/2014   Coronary artery disease involving native coronary artery of native heart with angina pectoris (HCC) 01/24/2014   Tobacco use 01/24/2014   History of acute anterior wall MI 01/23/2014   PCP:  Pcp, No Pharmacy:   [id number] [address], [place] - [address] [place] [address] Phone: [phone] Fax: [phone]  [facility] [address] [place] [address] Phone: [phone] Fax: [phone]     Social Drivers  of Health (SDOH) Social History: SDOH Screenings   Food Insecurity: No Food Insecurity (10/15/2023)  Housing: Low Risk  (10/15/2023)  Transportation Needs: No Transportation Needs (10/15/2023)  Utilities: Not At Risk (10/15/2023)  Tobacco Use: High Risk (10/15/2023)   SDOH Interventions:     Readmission Risk Interventions     No  data to display

## 2023-10-18 NOTE — Progress Notes (Signed)
Chart reviewed at this time. LAD occlusion, severe mid RCA stenosis. Diuresed fairly well, net -ve 2.8 L, on room air.  Okay to proceed with RCA PCI today.  Elder Negus, MD

## 2023-10-18 NOTE — Progress Notes (Signed)
Heart Failure Stewardship Pharmacist Progress Note   PCP: Pcp, No PCP-Cardiologist: Tonny Bollman, MD    HPI:  53 yo M with PMH of CAD. History of STEMI in 2015 and was treated with PCI to LAD. EF 50%.   He presented to outpatient cardiology on 1/30 with complaints of chest pain. Had a similar episode in 08/2023 where he went to the [facility] ED for evaluation but ended up leaving after waiting 4 hours in the the lobby of the ED when his chest pain resolved. Also complained of shortness of breath and orthopnea. Denied edema, lightheadedness, or syncope. EKG showed interval anterolateral infarct. Sent for urgent cardiac cath.   LHC on 1/31 with chronically occluded mid LAD stent, high grade mid right RCA lesion. LVEDP 43 and severe LV dysfunction. Admitted to the hospital for diuresis, ECHO/GDMT titration, and planned PCI to RCA. ECHO 2/1 showed LVEF 30-35%, RWMA consistent with LAD infarction vs Takotsubo, G1DD, RV normal. Completed staged PCI to RCA on 2/3.   Patient states he is feeling well. Denies shortness of breath. No LE edema on exam. His wife and mother-in-law are in the room with him. Reports that he missed medications frequently PTA - usually at least once or twice per week. Attributes this to his inconsistent and demanding work schedule. Understands the importance of compliance moving forward and we talked about the risk of stent thrombosis with missed DAPT. His wife is going to help make sure he doesn't miss doses of his medications.   He is currently uninsured but uses the medication program with [facility]. Has recently been approved for [facility] coverage - he is unsure if this includes medication benefits or not. After his discharge, he will call to make the first appointment and inquire about medication benefits.   Current HF Medications: Beta Blocker: metoprolol XL 12.5 mg daily  Prior to admission HF Medications: Beta blocker: carvedilol 6.25 mg BID ACE/ARB/ARNI: lisinopril  2.5 mg daily  Pertinent Lab Values: Serum creatinine 0.98, BUN 15, Potassium 3.9, Sodium 136, Magnesium 2.1  Vital Signs: Weight: 165 lbs (admission weight: 176 lbs on 1/30 in office) Blood pressure: 90-100/60s  Heart rate: 60-70s  I/O: net -0.3L yesterday; net -3.4L since admission  Medication Assistance / Insurance Benefits Check: Does the patient have prescription insurance?  No - [facility] and believes he'll be able to get medications from the [place] pharmacy in the future  Outpatient Pharmacy:  [facility] to [facility] Is the patient willing to use [facility] TOC pharmacy at discharge? Yes Is the patient willing to transition their outpatient pharmacy to utilize a [facility] outpatient pharmacy?   No - may continue to use [facility] vs the [facility] pharmacy    Assessment: 1. Acute systolic CHF (LVEF 30-35%), due to ICM. NYHA class II symptoms. - Now off IV lasix. Volume status improved.  - Continue metoprolol XL 12.5 mg daily - Was taking lisinopril PTA - holding with lower BP - Consider adding low dose ARB +/- spironolactone tomorrow if BP improves - Consider starting Jardiance 10 mg daily today since this has less effect on BP  Plan: 1) Medication changes recommended at this time: - Start Jardiance 10 mg daily  2) Patient assistance: - Uninsured - may be getting [facility] pharmacy benefits - will follow up as outpatient - Can use free 30 day trial card in the meantime  3)  Education  - Patient has been educated on current HF medications and potential  additions to HF medication regimen - Patient verbalizes understanding that over the next few months, these medication doses may change and more medications may be added to optimize HF regimen - Patient has been educated on basic disease state pathophysiology and goals of therapy - Spent extra time with him reviewing fluid and diet restrictions with new low EF   Sharen Hones, PharmD,  BCPS Heart Failure Stewardship Pharmacist Phone [phone]

## 2023-10-18 NOTE — Progress Notes (Signed)
Patient Name: Kenneth Medina. Date of Encounter: 10/18/2023 [facility] Cardiologist: Tonny Bollman, MD   Interval Summary  .    Patient was see for an outpatient cardiac catheterization on 10/15/23. Due to being fluid overloaded he was diuresed over the weekend and lost 11 lbs. PCI was planned for today (10/18/23) Echo on 02/01 showed and EF of 30-35%. (HFrEF). [facility]  ROS: No chest pain, minimal SOB, No light headedness.  Vital Signs .    Vitals:   10/17/23 0830 10/17/23 1043 10/17/23 2036 10/18/23 0646  BP: 112/65 107/66  106/72  Pulse: 73 87 88 74  Resp: 20  18 18   Temp: 98.2 F (36.8 C)  98.3 F (36.8 C) 98 F (36.7 C)  TempSrc: Oral  Oral Oral  SpO2:    98%  Weight:    75.2 kg  Height:        Intake/Output Summary (Last 24 hours) at 10/18/2023 0708 Last data filed at 10/18/2023 0500 Gross per 24 hour  Intake 1195 ml  Output 1450 ml  Net -255 ml      10/18/2023    6:46 AM 10/17/2023    5:36 AM 10/15/2023    5:33 AM  Last 3 Weights  Weight (lbs) 165 lb 11.2 oz 164 lb 12.8 oz 176 lb  Weight (kg) 75.161 kg 74.753 kg 79.833 kg      Telemetry/ECG    Normal sinus rhythm, heart rates 70's -90's - Personally Reviewed  Relevant tests  Cardiac Cath 10/15/23   Prox LAD to Mid LAD lesion is 100% stenosed. Prox RCA to Mid RCA lesion is 80% stenosed. Prox LAD lesion is 30% stenosed.   1.  Chronically occluded mid LAD stent. 2.  High-grade mid right RCA lesion. 3.  LVEDP of 43 mmHg with ventriculography demonstrating severe LV dysfunction.  Echo 10/16/23 IMPRESSIONS 1. Swirling of contrast in the LV apex but no evidence of LV thrombus. Left ventricular ejection fraction, by estimation, is 30 to 35% . The left ventricle has moderately decreased function. The left ventricle demonstrates regional wall motion abnormalities in a pattern consistent with LAD infarction versus Takostubo cardiomyopathy. Left ventricular diastolic parameters  are consistent with Grade I diastolic dysfunction ( impaired relaxation) . 2. Right ventricular systolic function is normal. The right ventricular size is normal. Tricuspid regurgitation signal is inadequate for assessing PA pressure. 3. The mitral valve is normal in structure. Trivial mitral valve regurgitation. No evidence of mitral stenosis. 4. The aortic valve is normal in structure. Aortic valve regurgitation is not visualized. No aortic stenosis is present. 5. Aortic dilatation noted. There is borderline dilatation of the aortic root, measuring 39 mm. 6. The inferior vena cava is normal in size with greater than 50% respiratory variability, suggesting right atrial pressure of 3 mmHg.  Physical Exam .   GEN: No acute distress.   Neck: No JVD Cardiac: RRR, no murmurs, rubs, or gallops.  Respiratory: Clear to auscultation bilaterally. GI: Soft, nontender, non-distended  MS: Minimal edema  Assessment & Plan .     Coronary artery disease, Chest pain  Prox LAD 30% occluded, Mid occlusion of LAD is 100% stenosed, 80 % stenosis of prox RCA, (cath 01/31)  Will load with 180 Mg of Brilinta, currently on Asprin 81mg . On Lipitor 80 mg. Plan for PCI today  Heart failure with reduced ejection fraction (acute on chronic) -- Echo on 10/16/23 showed EF of 30-35%, RV function is normal. 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 Possible LAD infarction vs Takotsubo cardiomyopathy. There is akinesis of the mid inferolateral segment. Neighboring segments are hypokinetic (basal inferolateral segment, mid anterolateral segment, mid inferoseptal segment, mid anterior segment, and mid inferior segment). Grade 1 diastolic dysfunction.  -- Continue GDMT with TOPROL 12.5, (pulse is 72, BP 0646) -- Takes Lisinopril at home and has be put on hold while in hospital. Can start Entresto when pressures allow. Considering first starting Jardiance due to lower  blood pressures. -- Lasix was held prior to the procedure. Pt diuresed weight at admission is 176 lbs, current weight 165.7 lbs (down 11 lbs). Shortness of breath is improving. Currently on room air. Potassium 3.9, Mag 2.1, GFR >60 these are stable.   HLD, not at goal of < 55 -- Continue management with Lipitor 80 mg  Tobacco abuse -- Counsel on tobacco cessation  Essential hypertension -- Continue management with GDMT (Toprol), titrate as BP allows. Most recent BP at 0646 is 106/72, HR 72  Mild Sleep apnea -- Refer for outpatient evaluation  For questions or updates, please contact [facility] Please consult [email or web address] for contact info under        Signed, Arabella Merles, PA-C

## 2023-10-18 NOTE — Interval H&P Note (Signed)
History and Physical Interval Note:  10/18/2023 9:06 AM   Chart reviewed at this time. LAD occlusion, severe mid RCA stenosis. Diuresed fairly well, net -ve 2.8 L, on room air.  Okay to proceed with RCA PCI today.  10/18/2023 Kenneth Emiliano Dyer, MD      [name].  has presented today for surgery, with the diagnosis of CAD.  The various methods of treatment have been discussed with the patient and family. After consideration of risks, benefits and other options for treatment, the patient has consented to  Procedure(s): CORONARY STENT INTERVENTION (N/A) as a surgical intervention.  The patient's history has been reviewed, patient examined, no change in status, stable for surgery.  I have reviewed the patient's chart and labs.  Questions were answered to the patient's satisfaction.     Bryan Lemma

## 2023-10-19 ENCOUNTER — Other Ambulatory Visit: Payer: Self-pay

## 2023-10-19 ENCOUNTER — Encounter (HOSPITAL_COMMUNITY): Payer: Self-pay | Admitting: Cardiology

## 2023-10-19 ENCOUNTER — Other Ambulatory Visit (HOSPITAL_COMMUNITY): Payer: Self-pay

## 2023-10-19 DIAGNOSIS — I5043 Acute on chronic combined systolic (congestive) and diastolic (congestive) heart failure: Secondary | ICD-10-CM | POA: Diagnosis not present

## 2023-10-19 DIAGNOSIS — I25118 Atherosclerotic heart disease of native coronary artery with other forms of angina pectoris: Secondary | ICD-10-CM | POA: Diagnosis not present

## 2023-10-19 DIAGNOSIS — I237 Postinfarction angina: Secondary | ICD-10-CM | POA: Diagnosis not present

## 2023-10-19 DIAGNOSIS — I110 Hypertensive heart disease with heart failure: Secondary | ICD-10-CM | POA: Diagnosis not present

## 2023-10-19 LAB — CBC
HCT: 42.8 % (ref 39.0–52.0)
Hemoglobin: 14 g/dL (ref 13.0–17.0)
MCH: 29.8 pg (ref 26.0–34.0)
MCHC: 32.7 g/dL (ref 30.0–36.0)
MCV: 91.1 fL (ref 80.0–100.0)
Platelets: 263 10*3/uL (ref 150–400)
RBC: 4.7 MIL/uL (ref 4.22–5.81)
RDW: 12.5 % (ref 11.5–15.5)
WBC: 10.5 10*3/uL (ref 4.0–10.5)
nRBC: 0 % (ref 0.0–0.2)

## 2023-10-19 LAB — MAGNESIUM: Magnesium: 2.3 mg/dL (ref 1.7–2.4)

## 2023-10-19 LAB — HEMOGLOBIN A1C
Hgb A1c MFr Bld: 6 % — ABNORMAL HIGH (ref 4.8–5.6)
Mean Plasma Glucose: 125.5 mg/dL

## 2023-10-19 LAB — BASIC METABOLIC PANEL
Anion gap: 10 (ref 5–15)
BUN: 14 mg/dL (ref 6–20)
CO2: 25 mmol/L (ref 22–32)
Calcium: 8.7 mg/dL — ABNORMAL LOW (ref 8.9–10.3)
Chloride: 102 mmol/L (ref 98–111)
Creatinine, Ser: 0.82 mg/dL (ref 0.61–1.24)
GFR, Estimated: >60 mL/min (ref >60)
Glucose, Bld: 98 mg/dL (ref 70–99)
Potassium: 4.2 mmol/L (ref 3.5–5.1)
Sodium: 137 mmol/L (ref 135–145)

## 2023-10-19 MED ORDER — EMPAGLIFLOZIN 10 MG PO TABS
10.0000 mg | ORAL_TABLET | Freq: Every day | ORAL | 1 refills | Status: DC
  Filled 2023-10-19: qty 30, 30d supply, fill #0
  Filled 2023-11-13 – 2023-12-20 (×3): qty 30, 30d supply, fill #1

## 2023-10-19 MED ORDER — METOPROLOL SUCCINATE ER 25 MG PO TB24
12.5000 mg | ORAL_TABLET | Freq: Every day | ORAL | 1 refills | Status: DC
  Filled 2023-10-19: qty 45, 90d supply, fill #0
  Filled 2023-11-13 – 2024-01-09 (×3): qty 45, 90d supply, fill #1

## 2023-10-19 MED ORDER — CLOPIDOGREL BISULFATE 75 MG PO TABS
75.0000 mg | ORAL_TABLET | Freq: Every day | ORAL | 2 refills | Status: DC
  Filled 2023-10-19: qty 90, 90d supply, fill #0
  Filled 2023-11-13 – 2024-01-17 (×3): qty 90, 90d supply, fill #1
  Filled 2024-04-27: qty 90, 90d supply, fill #2

## 2023-10-19 MED ORDER — FUROSEMIDE 40 MG PO TABS
40.0000 mg | ORAL_TABLET | Freq: Every day | ORAL | 2 refills | Status: DC
  Filled 2023-10-19: qty 30, 30d supply, fill #0
  Filled 2023-11-13: qty 30, 30d supply, fill #1
  Filled 2023-12-13: qty 30, 30d supply, fill #2

## 2023-10-19 MED ORDER — CLOPIDOGREL BISULFATE 75 MG PO TABS
ORAL_TABLET | ORAL | 0 refills | Status: DC
  Filled 2023-10-19: qty 8, 1d supply, fill #0

## 2023-10-19 MED ORDER — TICAGRELOR 90 MG PO TABS
90.0000 mg | ORAL_TABLET | Freq: Two times a day (BID) | ORAL | 2 refills | Status: DC
  Filled 2023-10-19: qty 60, 30d supply, fill #0

## 2023-10-19 NOTE — Progress Notes (Signed)
 Heart Failure Stewardship Pharmacist Progress Note   PCP: Pcp, No PCP-Cardiologist: Ozell Fell, MD    HPI:  53 yo M with PMH of CAD. History of STEMI in 2015 and was treated with PCI to LAD. EF 50%.   He presented to outpatient cardiology on 1/30 with complaints of chest pain. Had a similar episode in 08/2023 where he went to the [facility] ED for evaluation but ended up leaving after waiting 4 hours in the the lobby of the ED when his chest pain resolved. Also complained of shortness of breath and orthopnea. Denied edema, lightheadedness, or syncope. EKG showed interval anterolateral infarct. Sent for urgent cardiac cath.   LHC on 1/31 with chronically occluded mid LAD stent, high grade mid right RCA lesion. LVEDP 43 and severe LV dysfunction. Admitted to the hospital for diuresis, ECHO/GDMT titration, and planned PCI to RCA. ECHO 2/1 showed LVEF 30-35%, RWMA consistent with LAD infarction vs Takotsubo, G1DD, RV normal. Completed staged PCI to RCA on 2/3.   Reports that he missed medications frequently PTA - usually at least once or twice per week. Attributes this to his inconsistent and demanding work schedule. Understands the importance of compliance moving forward and we talked about the risk of stent thrombosis with missed DAPT. His wife is going to help make sure he doesn't miss doses of his medications.   He is currently uninsured but uses the medication program with [facility]. Has recently been approved for [facility] coverage - he is unsure if this includes medication benefits or not. After his discharge, he will call to make the first appointment and inquire about medication benefits.   Patient states he is feeling well. Denies shortness of breath. No LE edema on exam.  No lightheadedness or dizziness. Worked with cardiac rehab this morning with no issues. Plan discharge today. No new questions or concerns at this time.   Current HF Medications: Beta Blocker: metoprolol  XL 12.5 mg  daily SGLT2i: Jardiance  10 mg daily  Prior to admission HF Medications: Beta blocker: carvedilol  6.25 mg BID ACE/ARB/ARNI: lisinopril  2.5 mg daily  Pertinent Lab Values: Serum creatinine 0.82, BUN 14, Potassium 4.2, Sodium 137, Magnesium  2.3  Vital Signs: Weight: 167 lbs (admission weight: 176 lbs on 1/30 in office) Blood pressure: 90-100/60s  Heart rate: 60-70s  I/O: net -0.5L yesterday; net -3.7L since admission  Medication Assistance / Insurance Benefits Check: Does the patient have prescription insurance?  No - [facility] and believes he'll be able to get medications from the [place] pharmacy in the future  Outpatient Pharmacy:  Prior to [facility] Is the patient willing to use [facility] TOC pharmacy at discharge? Yes Is the patient willing to transition their outpatient pharmacy to utilize a [facility] outpatient pharmacy?   No - may continue to use [facility] vs the [facility] pharmacy    Assessment: 1. Acute systolic CHF (LVEF 30-35%), due to ICM. NYHA class II symptoms. - Now off IV lasix . Volume status improved. Likely will need daily lasix  at discharge.  - Continue metoprolol  XL 12.5 mg daily - Was taking lisinopril  PTA - holding with lower BP - BP too low for ARB/ARNI/MRA at this time. Can consider at follow up if BP improves - Continue Jardiance  10 mg daily  Plan: 1) Medication changes recommended at this time: - Add furosemide  40 mg daily at discharge  2) Patient assistance: - Uninsured - may be getting [facility] pharmacy benefits - will follow up as outpatient -  Can use free 30 day trial card in the meantime - Jardiance  is on the [facility] formulary  3)  Education  - Patient has been educated on current HF medications and potential additions to HF medication regimen - Patient verbalizes understanding that over the next few months, these medication doses may change and more medications may be added to optimize HF regimen - Patient has  been educated on basic disease state pathophysiology and goals of therapy - Spent extra time with him reviewing fluid and diet restrictions with new low EF   Duwaine Plant, PharmD, BCPS Heart Failure Stewardship Pharmacist Phone [phone]

## 2023-10-19 NOTE — Progress Notes (Signed)
 CARDIAC REHAB PHASE I   PRE:  Rate/Rhythm: 70 SR  BP:  Sitting: 103/59     SaO2: 98 RA   MODE:  Ambulation: 240 ft   POST:  Rate/Rhythm: 72 SR  BP:  Sitting: 104/67     SaO2: 96 RA   Pt ambulated independently in hallway. Tolerated well with no CP, SOB or dizziness. Returned to bed with call bell and bedside table in reach. Post stent education including restrictions, risk factors, exercise guidelines, antiplatelet therapy importance, NTG use, heart healthy diet, smoking cessation and CRP2 reviewed. All questions and concerns addressed. Will refer to [facility] for CRP2. Plan for discharge home later today.   [phone] Vaughn Asberry Hacking, RN BSN 10/19/2023 9:03 AM

## 2023-10-19 NOTE — Progress Notes (Signed)
RUE PICC removed. Site unremarkable. Pt instructed to remain flat in bed until 1310, keep dressing clean, dry and intact for 24 hours. Teaching complete about reportable signs at site including drainage/bleeding/edema/redness/pain. He voiced understanding.

## 2023-10-19 NOTE — TOC Transition Note (Signed)
 Transition of Care [facility]) - Discharge Note   Patient Details  Name: Kenneth Medina. MRN: [id number] Date of Birth: 08/27/1971  Transition of Care [facility]) CM/SW Contact:  Sudie Erminio Deems, RN Phone Number: 10/19/2023, 1:15 PM  Clinical Narrative:  Case Manager received call from [facility]. Patient will be a member of the [facility] @ [phone] ext [phone]. Patient is aware that he will need to be established with a PCP.  Patient Rx's currently written for Brilinta  and the plan will be for home on Plavix . 30 day free Brilinta  card no longer works after 10-15-23. Rx's to be sent to [facility]. No further needs identified at this time.       Barriers to Discharge: No Barriers Identified   Patient Goals and CMS Choice Patient states their goals for this hospitalization and ongoing recovery are:: Return to home   Choice offered to / list presented to : NA     Discharge Plan and Services Additional resources added to the After Visit Summary for   In-house Referral: NA Discharge Planning Services: CM Consult    HH Arranged: NA  Social Drivers of Health (SDOH) Interventions SDOH Screenings   Food Insecurity: No Food Insecurity (10/15/2023)  Housing: Low Risk  (10/19/2023)  Transportation Needs: No Transportation Needs (10/19/2023)  Utilities: Not At Risk (10/15/2023)  Alcohol Screen: Low Risk  (10/19/2023)  Financial Resource Strain: Low Risk  (10/19/2023)  Tobacco Use: High Risk (10/15/2023)   Readmission Risk Interventions     No data to display

## 2023-10-19 NOTE — Discharge Summary (Signed)
 Discharge Summary    Patient ID: Kenneth Medina. MRN: [id number]; DOB: 1971-02-15  Admit date: 10/15/2023 Discharge date: 10/19/2023  PCP:  Pcp, No   [facility] Providers Cardiologist:  Ozell Fell, MD        Discharge Diagnoses    Principal Problem:   Acute on chronic systolic (congestive) heart failure (HCC) Active Problems:   History of acute anterior wall MI   Coronary artery disease involving native coronary artery of native heart with angina pectoris (HCC)   Tobacco use   Hyperlipidemia   Cardiomyopathy, ischemic   Essential hypertension   Mild sleep apnea    Diagnostic Studies/Procedures    Left heart Cath 10/15/23   Prox LAD to Mid LAD lesion is 100% stenosed.   Prox RCA to Mid RCA lesion is 80% stenosed.   Prox LAD lesion is 30% stenosed.   1.  Chronically occluded mid LAD stent. 2.  High-grade mid right RCA lesion. 3.  LVEDP of 43 mmHg with ventriculography demonstrating severe LV dysfunction.   Recommendation: Given severe LV dysfunction and severely elevated LVEDP; 10 mg of Lasix  IV x 1 was administered as the patient is Lasix  nave.  The patient will be admitted to the hospital, echocardiography, diuretic therapy, and medical optimization will be pursued prior to possible PCI of RCA disease.  Reviewed with Drs. McAlhany and [employer].  Echo 10/16/23 IMPRESSIONS     1. Swirling of contrast in the LV apex but no evidence of LV thrombus.  Left ventricular ejection fraction, by estimation, is 30 to 35%. The left  ventricle has moderately decreased function. The left ventricle  demonstrates regional wall motion  abnormalities in a pattern consistent with LAD infarction versus Takostubo  cardiomyopathy. Left ventricular diastolic parameters are consistent with  Grade I diastolic dysfunction (impaired relaxation).   2. Right ventricular systolic function is normal. The right ventricular  size is normal. Tricuspid regurgitation signal is  inadequate for assessing  PA pressure.   3. The mitral valve is normal in structure. Trivial mitral valve  regurgitation. No evidence of mitral stenosis.   4. The aortic valve is normal in structure. Aortic valve regurgitation is  not visualized. No aortic stenosis is present.   5. Aortic dilatation noted. There is borderline dilatation of the aortic  root, measuring 39 mm.   6. The inferior vena cava is normal in size with greater than 50%  respiratory variability, suggesting right atrial pressure of 3 mmHg.   Comparison(s): Changes from prior study are noted. LVEF worsened from  normal to 30-35% with RWMA now.   FINDINGS   Left Ventricle: Swirling of contrast in the apex but no evidence of LV  thrombus. Left ventricular ejection fraction, by estimation, is 30 to 35%.  The left ventricle has moderately decreased function. The left ventricle  demonstrates regional wall motion  abnormalities. Definity  contrast agent was given IV to delineate the left  ventricular endocardial borders. The left ventricular internal cavity size  was normal in size. There is no left ventricular hypertrophy. Left  ventricular diastolic parameters are  consistent with Grade I diastolic dysfunction (impaired relaxation).  Normal left ventricular filling pressure.     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 The mid  inferolateral segment is akinetic. The basal inferolateral segment, mid  anterolateral segment, mid inferoseptal segment, mid anterior segment, and  mid inferior segment are hypokinetic. The basal anterolateral segment,  basal  anterior segment, basal inferior  segment, and basal inferoseptal segment  are  normal.   Right Ventricle: The right ventricular size is normal. No increase in  right ventricular wall thickness. Right ventricular systolic function is  normal. Tricuspid regurgitation signal is inadequate for assessing PA  pressure.   Left Atrium:  Left atrial size was normal in size.   Right Atrium: Right atrial size was normal in size.   Pericardium: There is no evidence of pericardial effusion.   Mitral Valve: The mitral valve is normal in structure. Trivial mitral  valve regurgitation. No evidence of mitral valve stenosis.   Tricuspid Valve: The tricuspid valve is normal in structure. Tricuspid  valve regurgitation is not demonstrated. No evidence of tricuspid  stenosis.   Aortic Valve: The aortic valve is normal in structure. Aortic valve  regurgitation is not visualized. No aortic stenosis is present. Aortic  valve peak gradient measures 3.2 mmHg.   Pulmonic Valve: The pulmonic valve was normal in structure. Pulmonic valve  regurgitation is not visualized. No evidence of pulmonic stenosis.   Aorta: Aortic dilatation noted. There is borderline dilatation of the  aortic root, measuring 39 mm.   Venous: The inferior vena cava is normal in size with greater than 50%  respiratory variability, suggesting right atrial pressure of 3 mmHg.   IAS/Shunts: No atrial level shunt detected by color flow Doppler.   Left heart cath with PCI/ stent intervention   Very large caliber RCA with prox RCA lesion 20% stenosis, but significant catheter related spasm resolved with nitroglycerin .   Prox RCA to Mid RCA lesion is 80% stenosed.  TIMI-3 flow   A drug-eluting stent was successfully placed covering the 80% stenosis, using a STENT ONYX FRONTIER 4.0X15.  Postdilated to 4.6 mm. Post intervention, there is a 0% residual stenosis. -TIMI-3 flow maintained   Successful DES PCI of proximal to mid RCA 80% stenosis using a Onyx Frontier DES 4.0 mm x 15 mm postdilated to 4.6 mm.    Unable to advance guide catheter via right radial access due to discomfort from stenosis in the antecubital region.     RECOMMENDATIONS   In the absence of any other complications or medical issues, we expect the patient to be ready for discharge from an  interventional cardiology perspective on 10/19/2023.   Patient will need time for bed rest following femoral arterial access.   He will need additional medication titration of GDMT for CAD/ICM/HFrEF   Recommend uninterrupted dual antiplatelet therapy with Aspirin  81mg  daily and Ticagrelor  90mg  twice daily for a minimum of 12 months (ACS-Class I recommendation).     Alm Clay, MD _____________   History of Present Illness     Kenneth Medina. is a 53 y.o. male with history of CAD s/p STEMI with PCI/DES to LAD '15, HTN, HLD. ICM, tobacco use who presented to the clinic on 1/30 for follow-up evaluation.  Patient was initially seen for an anterior STEMI in 2015 and was treated with PCI of the LAD. Post MI his LVEF was 50%. Was lost to follow-up in 2018.  Seen at [facility] ([place] for chest pain and December 2024 was noted to have a nonischemic EKG and mildly elevated high-sensitivity troponins at 19.  After waiting on ER lobby for about 4 hours he left after his chest pain eased up.   He followed up with Dr. Wonda in the clinic on 01/30.  The EKG done in the clinic showed an anterior lateral infarct compared to the prior EKG done in December 2024.  The patient also had frequent symptoms of angina that eased off during rest. The pain was unresponsive to sublingual nitroglycerin . He also had shortness of breath, syncope, and episodes of palpations. Because of this a left heart cath was scheduled for the following day.      Hospital Course      Coronary artery disease, Chest pain  -- presented for outpatient cath on 1/31 that showed Prox LAD 30% occluded, Mid occlusion of LAD is 100% stenosed, 80 % stenosis of prox RCA,  -- After receiving his outpatient left heart cath the decision was made to admit because he was potentially fluid over loaded and needed PCI done. -- underwent staged PCI/DES was placed on 10/18/23. It postdilated to 4.6 mm.  -- DAPT was started with 81 mg ASA daily and Brilinta  90mg   twice daily but Brilinta  is no longer covered for the initial 30 days for patients. This was changed to Plavix . A loading dose of 600 mg was given on 10/19/23 and then Plavix  75 mg Daily for 12 months.  -- On Asprin 81 mg Daily, Plavix  75 mg Daily, Atorvastatin  80 mg daily, TOPROL  12.5 mg daily -- Was seen by cardiac rehab  Heart failure with reduced ejection fraction (acute on chronic) -- Echo on 10/16/23 showed EF of 30-35%, RV function is normal. 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 Secondary to LAD infarction. There is akinesis of the mid inferolateral segment. Neighboring segments are hypokinetic (basal inferolateral segment, mid anterolateral segment, mid inferoseptal segment, mid anterior segment, and mid inferior segment). Grade 1 diastolic dysfunction. Plan for follow up Echo in 3 months to see if EF improves.   -- GDMT: limited in the setting of soft BPs, tolerating TOPROL  12.5mg  daily, Encourage PT to help the patient start exercising and doing walks this might help pressures improve. Continue with Jardiance  10 mg daily. Consider addition of Entresto, +/- spiro as an outpatient pending BP readings   -- Pt diuresed well on IV Lasix  40mg  BID down 176 lbs >> 165.7 lbs (down 11 lbs), I/O down 3.7  l . Labs 02/04 stable Potassium 4.2, Mag 2.3 Cr 0.82 GFR >60 these are stable. Start PO Lasix  40 mg daily to stabilize because was so fluid over loaded at admission. Educated to reduce sodium intake to <2 gm/day.    HLD,   -- 01/25 LDL 104 not at goal of < 55 -- Continue management with Lipitor  80 mg daily, Zetia  10 mg daily.   Tobacco abuse -- Counseled on tobacco cessation   Essential hypertension -- Continue management with GDMT (Toprol ), titrate as BP allows. Most recent BP 103/59 , HR 83 at 0447.   Mild Sleep apnea -- Refer for outpatient evaluation   [facility]  [facility].    Did the patient have an acute coronary syndrome (MI, NSTEMI, STEMI, etc) this admission?:  No                               Did the patient have a percutaneous coronary intervention (stent / angioplasty)?:  Yes.     Cath/PCI Registry Performance & Quality Measures: Aspirin  prescribed? - Yes ADP Receptor Inhibitor (Plavix /Clopidogrel , Brilinta /Ticagrelor  or Effient /Prasugrel ) prescribed (includes medically managed patients)? - Yes High Intensity Statin (Lipitor  40-80mg  or Crestor 20-40mg ) prescribed? - Yes For  EF <40%, was ACEI/ARB prescribed? - No - Reason:  Blood pressure has been running low For EF <40%, Aldosterone Antagonist (Spironolactone or Eplerenone) prescribed? - No - Reason:  Blood pressure has been running low Cardiac Rehab Phase II ordered? - Yes       The patient will be scheduled for a TOC follow up appointment in 10-14 days.  A message has been sent to the [facility] and Scheduling Pool at the office where the patient should be seen for follow up.  _____________  Discharge Vitals Blood pressure (!) 103/59, pulse 69, temperature 98.4 F (36.9 C), temperature source Oral, resp. rate 17, height 5' 9 (1.753 m), weight 76.1 kg, SpO2 97%.  Filed Weights   10/17/23 0536 10/18/23 0646 10/19/23 0447  Weight: 74.8 kg 75.2 kg 76.1 kg    Labs & Radiologic Studies    CBC Recent Labs    10/18/23 0500 10/19/23 0500  WBC 11.2* 10.5  HGB 14.3 14.0  HCT 43.1 42.8  MCV 89.8 91.1  PLT 304 263   Basic Metabolic Panel Recent Labs    97/96/74 0500 10/19/23 0500  NA 136 137  K 3.9 4.2  CL 99 102  CO2 28 25  GLUCOSE 100* 98  BUN 15 14  CREATININE 0.98 0.82  CALCIUM  8.6* 8.7*  MG 2.1 2.3   Liver Function Tests No results for input(s): AST, ALT, ALKPHOS, BILITOT, PROT, ALBUMIN in the last 72 hours. No results for input(s): LIPASE, AMYLASE in the last 72 hours. High Sensitivity Troponin:   No results for input(s): TROPONINIHS  in the last 720 hours.  BNP Invalid input(s): POCBNP D-Dimer No results for input(s): DDIMER in the last 72 hours. Hemoglobin A1C Recent Labs    10/19/23 0703  HGBA1C 6.0*   Fasting Lipid Panel No results for input(s): CHOL, HDL, LDLCALC, TRIG, CHOLHDL, LDLDIRECT in the last 72 hours. Thyroid  Function Tests No results for input(s): TSH, T4TOTAL, T3FREE, THYROIDAB in the last 72 hours.  Invalid input(s): FREET3 _____________  CARDIAC CATHETERIZATION Result Date: 10/18/2023   Very large caliber RCA with prox RCA lesion 20% stenosis, but significant catheter related spasm resolved with nitroglycerin .   Prox RCA to Mid RCA lesion is 80% stenosed.  TIMI-3 flow   A drug-eluting stent was successfully placed covering the 80% stenosis, using a STENT ONYX FRONTIER 4.0X15.  Postdilated to 4.6 mm. Post intervention, there is a 0% residual stenosis. -TIMI-3 flow maintained Successful DES PCI of proximal to mid RCA 80% stenosis using a Onyx Frontier DES 4.0 mm x 15 mm postdilated to 4.6 mm. Unable to advance guide catheter via right radial access due to discomfort from stenosis in the antecubital region. RECOMMENDATIONS   In the absence of any other complications or medical issues, we expect the patient to be ready for discharge from an interventional cardiology perspective on 10/19/2023.   Patient will need time for bed rest following femoral arterial access.   He will need additional medication titration of GDMT for CAD/ICM/HFrEF   Recommend uninterrupted dual antiplatelet therapy with Aspirin  81mg  daily and Ticagrelor  90mg  twice daily for a minimum of 12 months (ACS-Class I recommendation). Alm Clay, MD  ECHOCARDIOGRAM COMPLETE Result Date: 10/16/2023    ECHOCARDIOGRAM REPORT   Patient Name:   Kenneth Medina. Date of Exam: 10/16/2023 Medical Rec #:  [id number]              Height:       69.0 in Accession #:  [id number]             Weight:       176.0 lb Date of Birth:   03-Aug-1971               BSA:          1.957 m Patient Age:    52 years               BP:           100/67 mmHg Patient Gender: M                      HR:           80 bpm. Exam Location:  Inpatient Procedure: 2D Echo, Cardiac Doppler, Color Doppler and Intracardiac            Opacification Agent Indications:    CHF - Acute Systolic I50.9  History:        Patient has prior history of Echocardiogram examinations, most                 recent 02/16/2022. CHF and Cardiomyopathy, CAD; Risk                 Factors:Hypertension, Sleep Apnea, Current Smoker and                 Dyslipidemia.  Sonographer:    Thea Norlander RCS Referring Phys: [id number] ARUN K THUKKANI IMPRESSIONS  1. Swirling of contrast in the LV apex but no evidence of LV thrombus. Left ventricular ejection fraction, by estimation, is 30 to 35%. The left ventricle has moderately decreased function. The left ventricle demonstrates regional wall motion abnormalities in a pattern consistent with LAD infarction versus Takostubo cardiomyopathy. Left ventricular diastolic parameters are consistent with Grade I diastolic dysfunction (impaired relaxation).  2. Right ventricular systolic function is normal. The right ventricular size is normal. Tricuspid regurgitation signal is inadequate for assessing PA pressure.  3. The mitral valve is normal in structure. Trivial mitral valve regurgitation. No evidence of mitral stenosis.  4. The aortic valve is normal in structure. Aortic valve regurgitation is not visualized. No aortic stenosis is present.  5. Aortic dilatation noted. There is borderline dilatation of the aortic root, measuring 39 mm.  6. The inferior vena cava is normal in size with greater than 50% respiratory variability, suggesting right atrial pressure of 3 mmHg. Comparison(s): Changes from prior study are noted. LVEF worsened from normal to 30-35% with RWMA now. FINDINGS  Left Ventricle: Swirling of contrast in the apex but no evidence of LV thrombus. Left  ventricular ejection fraction, by estimation, is 30 to 35%. The left ventricle has moderately decreased function. The left ventricle demonstrates regional wall motion abnormalities. Definity  contrast agent was given IV to delineate the left ventricular endocardial borders. The left ventricular internal cavity size was normal in size. There is no left ventricular hypertrophy. Left ventricular diastolic parameters are consistent with Grade I diastolic dysfunction (impaired relaxation). Normal left ventricular filling pressure.  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 The mid inferolateral segment is akinetic. The basal inferolateral segment, mid anterolateral segment, mid inferoseptal segment, mid anterior segment, and mid inferior segment are hypokinetic. The basal anterolateral segment, basal anterior segment, basal inferior segment, and basal inferoseptal segment are normal. Right Ventricle: The right ventricular size is normal. No increase in right ventricular wall thickness. Right ventricular systolic function is normal. Tricuspid regurgitation signal  is inadequate for assessing PA pressure. Left Atrium: Left atrial size was normal in size. Right Atrium: Right atrial size was normal in size. Pericardium: There is no evidence of pericardial effusion. Mitral Valve: The mitral valve is normal in structure. Trivial mitral valve regurgitation. No evidence of mitral valve stenosis. Tricuspid Valve: The tricuspid valve is normal in structure. Tricuspid valve regurgitation is not demonstrated. No evidence of tricuspid stenosis. Aortic Valve: The aortic valve is normal in structure. Aortic valve regurgitation is not visualized. No aortic stenosis is present. Aortic valve peak gradient measures 3.2 mmHg. Pulmonic Valve: The pulmonic valve was normal in structure. Pulmonic valve regurgitation is not visualized. No evidence of pulmonic stenosis. Aorta: Aortic dilatation noted. There is  borderline dilatation of the aortic root, measuring 39 mm. Venous: The inferior vena cava is normal in size with greater than 50% respiratory variability, suggesting right atrial pressure of 3 mmHg. IAS/Shunts: No atrial level shunt detected by color flow Doppler.  LEFT VENTRICLE PLAX 2D LVIDd:         4.90 cm   Diastology LVIDs:         3.50 cm   LV e' medial:    10.50 cm/s LV PW:         1.10 cm   LV E/e' medial:  4.7 LV IVS:        1.10 cm   LV e' lateral:   9.08 cm/s LVOT diam:     2.20 cm   LV E/e' lateral: 5.5 LV SV:         39 LV SV Index:   20 LVOT Area:     3.80 cm  RIGHT VENTRICLE             IVC RV S prime:     11.70 cm/s  IVC diam: 1.30 cm TAPSE (M-mode): 1.9 cm LEFT ATRIUM             Index        RIGHT ATRIUM           Index LA diam:        3.30 cm 1.69 cm/m   RA Area:     12.60 cm LA Vol (A2C):   33.7 ml 17.22 ml/m  RA Volume:   29.50 ml  15.08 ml/m LA Vol (A4C):   24.4 ml 12.47 ml/m LA Biplane Vol: 28.6 ml 14.62 ml/m  AORTIC VALVE AV Area (Vmax): 2.94 cm AV Vmax:        89.10 cm/s AV Peak Grad:   3.2 mmHg LVOT Vmax:      68.97 cm/s LVOT Vmean:     43.867 cm/s LVOT VTI:       0.101 m  AORTA Ao Root diam: 3.90 cm Ao Asc diam:  3.50 cm MITRAL VALVE MV Area (PHT): 3.99 cm    SHUNTS MV Decel Time: 190 msec    Systemic VTI:  0.10 m MV E velocity: 49.60 cm/s  Systemic Diam: 2.20 cm MV A velocity: 86.40 cm/s MV E/A ratio:  0.57 Kenneth Medina Electronically signed by Diannah Late Medina Signature Date/Time: 10/16/2023/2:35:32 PM    Final    US  EKG SITE RITE Result Date: 10/15/2023 If Site Rite image not attached, placement could not be confirmed due to current cardiac rhythm.  CARDIAC CATHETERIZATION Result Date: 10/15/2023   Prox LAD to Mid LAD lesion is 100% stenosed.   Prox RCA to Mid RCA lesion is 80% stenosed.   Prox LAD lesion is 30% stenosed.  1.  Chronically occluded mid LAD stent. 2.  High-grade mid right RCA lesion. 3.  LVEDP of 43 mmHg with ventriculography demonstrating  severe LV dysfunction. Recommendation: Given severe LV dysfunction and severely elevated LVEDP; 10 mg of Lasix  IV x 1 was administered as the patient is Lasix  nave.  The patient will be admitted to the hospital, echocardiography, diuretic therapy, and medical optimization will be pursued prior to possible PCI of RCA disease.  Reviewed with Drs. McAlhany and [employer].   Disposition   Pt is being discharged home today in good condition.  Follow-up Plans & Appointments     Follow-up Information     [facility]. Go in 7 day(s).   Specialty: Cardiology Why: Hospital follow up 10/26/2023 @ 10:30 am PLEASE bring a current medication list to appointment Contact information: [address] [place]  [phone]               Discharge Instructions     (HEART FAILURE PATIENTS) Call MD:  Anytime you have any of the following symptoms: 1) 3 pound weight gain in 24 hours or 5 pounds in 1 week 2) shortness of breath, with or without a dry hacking cough 3) swelling in the hands, feet or stomach 4) if you have to sleep on extra pillows at night in order to breathe.   Complete by: As directed    AMB Referral to Advanced Lipid Disorders Clinic   Complete by: As directed    Internal Lipid Clinic Referral Scheduling  Internal lipid clinic referrals are providers within [facility], who wish to refer established patients for routine management (help in starting PCSK9 inhibitor therapy) or advanced therapies.  Internal MD referral criteria:              1. All patients with LDL>190 mg/dL  2. All patients with Triglycerides >500 mg/dL  3. Patients with suspected or confirmed heterozygous familial hyperlipidemia (HeFH) or homozygous familial hyperlipidemia (HoFH)  4. Patients with family history of suspicious for genetic dyslipidemia desiring genetic testing  5. Patients refractory to standard guideline based therapy  6.  Patients with statin intolerance (failed 2 statins, one of which must be a high potency statin)  7. Patients who the provider desires to be seen by MD   Internal PharmD referral criteria:   1. Follow-up patients for medication management  2. Follow-up for compliance monitoring  3. Patients for drug education  4. Patients with statin intolerance  5. PCSK9 inhibitor education and prior authorization approvals  6. Patients with triglycerides <500 mg/dL  External Lipid Clinic Referral  External lipid clinic referrals are for providers outside of [facility], considered new clinic patients - automatically routed to MD schedule   Amb Referral to Cardiac Rehabilitation   Complete by: As directed    Diagnosis: Coronary Stents   After initial evaluation and assessments completed: Virtual Based Care may be provided alone or in conjunction with Phase 2 Cardiac Rehab based on patient barriers.: Yes   Intensive Cardiac Rehabilitation (ICR) [facility] location only OR Traditional Cardiac Rehabilitation (TCR) *If criteria for ICR are not met will enroll in TCR [facility] only): Yes   Call MD for:  redness, tenderness, or signs of infection (pain, swelling, redness, odor or green/yellow discharge around incision site)   Complete by: As directed    Diet - low sodium heart healthy   Complete by: As directed    Discharge instructions   Complete by: As  directed    Groin Site Care Refer to this sheet in the next few weeks. These instructions provide you with information on caring for yourself after your procedure. Your caregiver may also give you more specific instructions. Your treatment has been planned according to current medical practices, but problems sometimes occur. Call your caregiver if you have any problems or questions after your procedure. HOME CARE INSTRUCTIONS You may shower 24 hours after the procedure. Remove the bandage (dressing) and gently wash the site with plain soap and water . Gently pat the  site dry.  Do not apply powder or lotion to the site.  Do not sit in a bathtub, swimming pool, or whirlpool for 5 to 7 days.  No bending, squatting, or lifting anything over 10 pounds (4.5 kg) as directed by your caregiver.  Inspect the site at least twice daily.  Do not drive home if you are discharged the same day of the procedure. Have someone else drive you.  You may drive 24 hours after the procedure unless otherwise instructed by your caregiver.  What to expect: Any bruising will usually fade within 1 to 2 weeks.  Blood that collects in the tissue (hematoma) may be painful to the touch. It should usually decrease in size and tenderness within 1 to 2 weeks.  SEEK IMMEDIATE MEDICAL CARE IF: You have unusual pain at the groin site or down the affected leg.  You have redness, warmth, swelling, or pain at the groin site.  You have drainage (other than a small amount of blood on the dressing).  You have chills.  You have a fever or persistent symptoms for more than 72 hours.  You have a fever and your symptoms suddenly get worse.  Your leg becomes pale, cool, tingly, or numb.  You have heavy bleeding from the site. Hold pressure on the site.  Radial Site Care Refer to this sheet in the next few weeks. These instructions provide you with information on caring for yourself after your procedure. Your caregiver may also give you more specific instructions. Your treatment has been planned according to current medical practices, but problems sometimes occur. Call your caregiver if you have any problems or questions after your procedure. HOME CARE INSTRUCTIONS You may shower the day after the procedure. Remove the bandage (dressing) and gently wash the site with plain soap and water . Gently pat the site dry.  Do not apply powder or lotion to the site.  Do not submerge the affected site in water  for 3 to 5 days.  Inspect the site at least twice daily.  Do not flex or bend the affected arm for 24  hours.  No lifting over 5 pounds (2.3 kg) for 5 days after your procedure.  Do not drive home if you are discharged the same day of the procedure. Have someone else drive you.  You may drive 24 hours after the procedure unless otherwise instructed by your caregiver.  What to expect: Any bruising will usually fade within 1 to 2 weeks.  Blood that collects in the tissue (hematoma) may be painful to the touch. It should usually decrease in size and tenderness within 1 to 2 weeks.  SEEK IMMEDIATE MEDICAL CARE IF: You have unusual pain at the radial site.  You have redness, warmth, swelling, or pain at the radial site.  You have drainage (other than a small amount of blood on the dressing).  You have chills.  You have a fever or persistent symptoms for more than  72 hours.  You have a fever and your symptoms suddenly get worse.  Your arm becomes pale, cool, tingly, or numb.  You have heavy bleeding from the site. Hold pressure on the site.   PLEASE DO NOT MISS ANY DOSES OF YOUR BRILINTA  Also keep a log of you blood pressures and bring back to your follow up appt. Please call the office with any questions.   Patients taking blood thinners should generally stay away from medicines like ibuprofen, Advil, Motrin, naproxen, and Aleve due to risk of stomach bleeding. You may take Tylenol  as directed or talk to your primary doctor about alternatives.  PLEASE ENSURE THAT YOU DO NOT RUN OUT OF YOUR BRILINTA /PLAVIX . This medication is very important to remain on for at least one year. IF you have issues obtaining this medication due to cost please CALL the office 3-5 business days prior to running out in order to prevent missing doses of this medication.   Increase activity slowly   Complete by: As directed         Discharge Medications   Allergies as of 10/19/2023       Reactions   Ibuprofen Hives        Medication List     STOP taking these medications    carvedilol  6.25 MG  tablet Commonly known as: COREG    lisinopril  2.5 MG tablet Commonly known as: ZESTRIL        TAKE these medications    aspirin  81 MG chewable tablet Chew 1 tablet (81 mg total) by mouth daily.   atorvastatin  80 MG tablet Commonly known as: LIPITOR  Take 1 tablet (80 mg total) by mouth daily at 6 PM. Please call [phone] to schedule an appointment for future refills. Thank you. 1st attempt.   clopidogrel  75 MG tablet Commonly known as: Plavix  Take 8 tablets by mouth once at 11pm this evening, then start new prescription of plavix  75mg  daily afterwards   clopidogrel  75 MG tablet Commonly known as: Plavix  Take 1 tablet (75 mg total) by mouth daily. Start taking on: October 20, 2023   ezetimibe  10 MG tablet Commonly known as: ZETIA  Take 1 tablet (10 mg total) by mouth daily. Please call [phone] to schedule an appointment for future refills. Thank you. 1st attempt.   furosemide  40 MG tablet Commonly known as: Lasix  Take 1 tablet (40 mg total) by mouth daily.   Jardiance  10 MG Tabs tablet Generic drug: empagliflozin  Take 1 tablet (10 mg total) by mouth daily. Start taking on: October 20, 2023   metoprolol  succinate 25 MG 24 hr tablet Commonly known as: TOPROL -XL Take 0.5 tablets (12.5 mg total) by mouth daily. Start taking on: October 20, 2023   nitroGLYCERIN  0.4 MG SL tablet Commonly known as: NITROSTAT  Place 1 tablet (0.4 mg total) under the tongue every 5 (five) minutes x 3 doses as needed for chest pain.           Outstanding Labs/Studies   At follow up will need a Bmet.   Duration of Discharge Encounter: APP Time: 20 minutes   Signed, Morse Clause, PA-C 10/19/2023, 1:43 PM

## 2023-10-19 NOTE — Progress Notes (Addendum)
IV Team consult received. Notified Allyson, RN nursing is unable to remove central lines without order. PICC removal order required.

## 2023-10-19 NOTE — Progress Notes (Signed)
 Patient Name: Kenneth Medina. Date of Encounter: 10/19/2023 [facility] Cardiologist: Ozell Fell, MD   Interval Summary  .    Mr Wery had PCI yesterday. He was sitting up in his bed and is feeling well today.   Vital Signs .    Vitals:   10/18/23 1450 10/18/23 1520 10/18/23 1940 10/19/23 0447  BP: 97/67 95/74 107/68 (!) 103/59  Pulse:  79 87 62  Resp:   18 18  Temp:   98.4 F (36.9 C) 98.3 F (36.8 C)  TempSrc:   Oral Oral  SpO2:  99% 98% 96%  Weight:    76.1 kg  Height:        Intake/Output Summary (Last 24 hours) at 10/19/2023 0800 Last data filed at 10/19/2023 0450 Gross per 24 hour  Intake 240 ml  Output 600 ml  Net -360 ml      10/19/2023    4:47 AM 10/18/2023    6:46 AM 10/17/2023    5:36 AM  Last 3 Weights  Weight (lbs) 167 lb 11.2 oz 165 lb 11.2 oz 164 lb 12.8 oz  Weight (kg) 76.068 kg 75.161 kg 74.753 kg      Telemetry/ECG    Normal sinus rhythm- Personally Reviewed  Physical Exam .   GEN: No acute distress.   Neck: No JVD Cardiac: RRR, no murmurs, rubs, or gallops.  Respiratory: Clear to auscultation bilaterally.  MS: minimal edema Skin: Minimal hematoma on the right radial and right femoral sight, no masses were pulpated, pulses 2+  Assessment & Plan .    Patient was see for an outpatient cardiac catheterization on 10/15/23. Due to being fluid overloaded he was diuresed over the weekend and lost 11 lbs. PCI was done on (10/18/23).The Prox to mid RCA had 80% stenosis and a DES times 1 was placed.   Coronary artery disease, Chest pain  -- presented for outpatient cath on 1/31 that showed Prox LAD 30% occluded, Mid occlusion of LAD is 100% stenosed, 80 % stenosis of prox RCA,  -- underwent staged PCI/DES was placed on 10/18/23. It postdilated to 4.6 mm.  -- start DAPT with 81 mg ASA daily and Brilinta  90mg  twice daily for at least 12 months.    Heart failure with reduced ejection fraction (acute on chronic) -- Echo on 10/16/23  showed EF of 30-35%, RV function is normal. 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 Secondary to LAD infarction. There is akinesis of the mid inferolateral segment. Neighboring segments are hypokinetic (basal inferolateral segment, mid anterolateral segment, mid inferoseptal segment, mid anterior segment, and mid inferior segment). Grade 1 diastolic dysfunction. Plan for follow up Echo in 3 months to see if EF improves.   -- GDMT: limited in the setting of soft BPs, tolerating TOPROL  12.5mg  daily, Encourage PT to help the patient start exercising and doing walks this might help pressures improve. Continue with Jardiance  10 mg daily. Consider addition of Entresto, +/- spiro as an outpatient pending BP readings  -- Pt diuresed well on IV Lasix  40mg  BID down 176 lbs >> 165.7 lbs (down 11 lbs), I/O down 3.7  l . Labs 02/04 stable Potassium 4.2, Mag 2.3 Cr 0.82 GFR >60 these are stable. Start PO Lasix  40 mg daily to stabilize because was so fluid over loaded at admission. Educated to reduce sodium intake to <2 gm/day.   HLD, 01/25 LDL 104 not at goal of <  55 -- Continue management with Lipitor  80 mg Daily.   Tobacco abuse -- Counseled on tobacco cessation   Essential hypertension -- Continue management with GDMT (Toprol ), titrate as BP allows. Most recent BP 103/59 , HR 83 at 0447.   Mild Sleep apnea -- Refer for outpatient evaluation  For questions or updates, please contact [facility] Please consult [email or web address] for contact info under        Signed, Brallan Denio, PA-C

## 2023-10-25 ENCOUNTER — Telehealth: Payer: Self-pay | Admitting: Family

## 2023-10-25 NOTE — Telephone Encounter (Signed)
 Pt confirmed appt for 10/26/23

## 2023-10-26 ENCOUNTER — Encounter: Payer: Self-pay | Admitting: Family

## 2023-10-26 ENCOUNTER — Other Ambulatory Visit
Admission: RE | Admit: 2023-10-26 | Discharge: 2023-10-26 | Disposition: A | Discharge status: Home / Self Care | Payer: MEDICAID | Source: Ambulatory Visit | Attending: Family | Admitting: Family

## 2023-10-26 ENCOUNTER — Ambulatory Visit (HOSPITAL_BASED_OUTPATIENT_CLINIC_OR_DEPARTMENT_OTHER): Payer: MEDICAID | Admitting: Family

## 2023-10-26 VITALS — BP 105/67 | HR 73 | Wt 171.2 lb

## 2023-10-26 DIAGNOSIS — E785 Hyperlipidemia, unspecified: Secondary | ICD-10-CM | POA: Insufficient documentation

## 2023-10-26 DIAGNOSIS — Z7902 Long term (current) use of antithrombotics/antiplatelets: Secondary | ICD-10-CM | POA: Insufficient documentation

## 2023-10-26 DIAGNOSIS — Z955 Presence of coronary angioplasty implant and graft: Secondary | ICD-10-CM | POA: Insufficient documentation

## 2023-10-26 DIAGNOSIS — I10 Essential (primary) hypertension: Secondary | ICD-10-CM

## 2023-10-26 DIAGNOSIS — I252 Old myocardial infarction: Secondary | ICD-10-CM | POA: Insufficient documentation

## 2023-10-26 DIAGNOSIS — I255 Ischemic cardiomyopathy: Secondary | ICD-10-CM

## 2023-10-26 DIAGNOSIS — I2510 Atherosclerotic heart disease of native coronary artery without angina pectoris: Secondary | ICD-10-CM | POA: Insufficient documentation

## 2023-10-26 DIAGNOSIS — Z720 Tobacco use: Secondary | ICD-10-CM

## 2023-10-26 DIAGNOSIS — Z7982 Long term (current) use of aspirin: Secondary | ICD-10-CM | POA: Insufficient documentation

## 2023-10-26 DIAGNOSIS — Z7984 Long term (current) use of oral hypoglycemic drugs: Secondary | ICD-10-CM | POA: Insufficient documentation

## 2023-10-26 DIAGNOSIS — I110 Hypertensive heart disease with heart failure: Secondary | ICD-10-CM | POA: Insufficient documentation

## 2023-10-26 DIAGNOSIS — F17210 Nicotine dependence, cigarettes, uncomplicated: Secondary | ICD-10-CM | POA: Insufficient documentation

## 2023-10-26 DIAGNOSIS — Z79899 Other long term (current) drug therapy: Secondary | ICD-10-CM | POA: Insufficient documentation

## 2023-10-26 LAB — BASIC METABOLIC PANEL
Anion gap: 10 (ref 5–15)
BUN: 14 mg/dL (ref 6–20)
CO2: 25 mmol/L (ref 22–32)
Calcium: 8.4 mg/dL — ABNORMAL LOW (ref 8.9–10.3)
Chloride: 100 mmol/L (ref 98–111)
Creatinine, Ser: 0.98 mg/dL (ref 0.61–1.24)
GFR, Estimated: >60 mL/min (ref >60)
Glucose, Bld: 75 mg/dL (ref 70–99)
Potassium: 3.8 mmol/L (ref 3.5–5.1)
Sodium: 135 mmol/L (ref 135–145)

## 2023-10-26 MED ORDER — LOSARTAN POTASSIUM 25 MG PO TABS: 12.5000 mg | ORAL_TABLET | Freq: Every day | ORAL | 3 refills | Status: DC

## 2023-10-26 NOTE — Progress Notes (Signed)
Advanced [facility] Failure Clinic Note   Referring Physician: recent admission PCP: Pcp, No (will be starting at [facility])  Cardiologist: Tonny Bollman, MD (last seen 01/25)  Chief Complaint: shortness of breath  HPI:  Kenneth Medina is a 53 y/o male with a history of CAD/ STEMI 2015 with PCI to LAD, hyperlipidemia, tobacco use, HTN and chronic heart failure.   He presented to outpatient cardiology on 1/30 with complaints of chest pain. Had a similar episode in 08/2023 where he went to the [facility] ED for evaluation but ended up leaving after waiting 4 hours in the the lobby of the ED when his chest pain resolved. Also complained of shortness of breath and orthopnea. Denied edema, lightheadedness, or syncope. EKG showed interval anterolateral infarct. Sent for urgent cardiac cath.    LHC on 10/15/23 with chronically occluded mid LAD stent, high grade mid right RCA lesion. LVEDP 43 and severe LV dysfunction. Admitted to the hospital for diuresis, ECHO/GDMT titration, and planned PCI to RCA. ECHO 10/16/23 showed LVEF 30-35%, RWMA consistent with LAD infarction vs Takotsubo, G1DD, RV normal. Completed staged PCI to RCA on 10/18/23. DAPT for one year.   Echo 02/16/22: EF 65-70% Echo 10/16/23: EF 30-35%, Grade I DD, borderline dilatation or aortic root at 39 mm, IVC normal  He presents today for his initial HF visit with a chief complaint of minimal shortness of breath, improved from recent admission. Has associated fatigue (improving) and knee/ neck pain. Denies chest pain, cough, palpitations, abdominal distention, pedal edema, dizziness or weight gain. Reports sleeping well on 1 pillow.   [facility] Working with attorney regarding disability process.    Was smoking 1.5 ppd cigarettes, now smoking < 1/2 ppd. Voices wanting to quit but stress level is high. 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  Says that at [employer] he would have to pick up the 40 pack of waters.   Review of Systems: [y] = yes, [ ]  = no   General: Weight gain [ ] ; Weight loss [ ] ; Anorexia [ ] ; Fatigue Cove.Etienne ]; Fever [ ] ; Chills [ ] ; Weakness [ ]   Cardiac: Chest pain/pressure [ ] ; Resting SOB [ ] ; Exertional SOB [ y]; Orthopnea [ ] ; Pedal Edema [ ] ; Palpitations [ ] ; Syncope [ ] ; Presyncope [ ] ; Paroxysmal nocturnal dyspnea[ ]   Pulmonary: Cough [ ] ; Wheezing[ ] ; Hemoptysis[ ] ; Sputum [ ] ; Snoring [ ]   GI: Vomiting[ ] ; Dysphagia[ ] ; Melena[ ] ; Hematochezia [ ] ; Heartburn[ ] ; Abdominal pain [ ] ; Constipation [ ] ; Diarrhea [ ] ; BRBPR [ ]   GU: Hematuria[ ] ; Dysuria [ ] ; Nocturia[ ]   Vascular: Pain in legs with walking [ ] ; Pain in feet with lying flat [ ] ; Non-healing sores [ ] ; Stroke [ ] ; TIA [ ] ; Slurred speech [ ] ;  Neuro: Headaches[ ] ; Vertigo[ ] ; Seizures[ ] ; Paresthesias[ ] ;Blurred vision [ ] ; Diplopia [ ] ; Vision changes [ ]   Ortho/Skin: Arthritis [ ] ; Joint pain Cove.Etienne ]; Muscle pain [ ] ; Joint swelling [ ] ; Back Pain [ ] ; Rash [ ]   Psych: Depression[ ] ; Anxiety[ ]   Heme: Bleeding problems [ ] ; Clotting disorders [ ] ; Anemia [ ]   Endocrine: Diabetes [ ] ; Thyroid dysfunction[ ]    Past Medical History:  Diagnosis Date   Coronary artery disease    a. s/p ant STEMI 5/15 >> LHC (5/15):  prox D1 30-40%, LAD  occluded, prox PDA 20-30%, mid to dist Ant HK, EF 45% >>> PCI:  2.75x22 mm Resolute DES to mid LAD   Hyperlipidemia    Hypertension    Ischemic cardiomyopathy    a. EF 45% at [facility] at time of MI >> b. Echo (5/15):  Apical HK, no clot, EF 50%, mod LVH, normal RVF   MI (myocardial infarction) (HCC)    2015   Tobacco abuse     Current Outpatient Medications  Medication Sig Dispense Refill   aspirin 81 MG chewable tablet Chew 1 tablet (81 mg total) by mouth daily.     atorvastatin (LIPITOR) 80 MG tablet Take 1 tablet (80 mg total) by mouth daily at 6 PM. Please call [phone] to schedule an appointment for future  refills. Thank you. 1st attempt. 30 tablet 11   clopidogrel (PLAVIX) 75 MG tablet Take 8 tablets by mouth once at 11pm this evening, then start new prescription of plavix 75mg  daily afterwards 8 tablet 0   clopidogrel (PLAVIX) 75 MG tablet Take 1 tablet (75 mg total) by mouth daily. 90 tablet 2   empagliflozin (JARDIANCE) 10 MG TABS tablet Take 1 tablet (10 mg total) by mouth daily. 90 tablet 1   ezetimibe (ZETIA) 10 MG tablet Take 1 tablet (10 mg total) by mouth daily. Please call [phone] to schedule an appointment for future refills. Thank you. 1st attempt. 30 tablet 11   furosemide (LASIX) 40 MG tablet Take 1 tablet (40 mg total) by mouth daily. 30 tablet 2   metoprolol succinate (TOPROL-XL) 25 MG 24 hr tablet Take 0.5 tablets (12.5 mg total) by mouth daily. 45 tablet 1   nitroGLYCERIN (NITROSTAT) 0.4 MG SL tablet Place 1 tablet (0.4 mg total) under the tongue every 5 (five) minutes x 3 doses as needed for chest pain. 25 tablet 5   No current facility-administered medications for this visit.    Allergies  Allergen Reactions   Ibuprofen Hives      Social History   Socioeconomic History   Marital status: Married    Spouse name: Dois Davenport   Number of children: 2   Years of education: Not on file   Highest education level: High school graduate  Occupational History   Occupation: Industrial/product designer  Tobacco Use   Smoking status: Every Day    Current packs/day: 0.50    Average packs/day: 0.5 packs/day for 30.0 years (15.0 ttl pk-yrs)    Types: Cigarettes   Smokeless tobacco: Never  Vaping Use   Vaping status: Never Used  Substance and Sexual Activity   Alcohol use: No   Drug use: No   Sexual activity: Not on file  Other Topics Concern   Not on file  Social History Narrative   The patient is married. He works as a Surveyor, minerals for [employer]. He does not drink alcohol or use illicit drugs. He does smoke cigarettes one half pack per day.   Social Drivers of Manufacturing engineer Strain: Low Risk  (10/19/2023)   Overall Financial Resource Strain (CARDIA)    Difficulty of Paying Living Expenses: Not very hard  Food Insecurity: No Food Insecurity (10/15/2023)   Hunger Vital Sign    Worried About Running Out of Food in the Last Year: Never true    Ran Out of Food in the Last Year: Never true  Transportation Needs: No Transportation Needs (10/19/2023)   PRAPARE - Administrator, Civil Service (Medical): No  Lack of Transportation (Non-Medical): No  Physical Activity: Not on file  Stress: Not on file  Social Connections: Not on file  Intimate Partner Violence: Not At Risk (10/15/2023)   Humiliation, Afraid, Rape, and Kick questionnaire    Fear of Current or Ex-Partner: No    Emotionally Abused: No    Physically Abused: No    Sexually Abused: No      Family History  Problem Relation Age of Onset   Hypertension Mother    Cancer Maternal Grandmother    Cancer Maternal Grandfather    Heart attack Neg Hx    Stroke Neg Hx    Vitals:   10/26/23 1041  BP: 105/67  Pulse: 73  SpO2: 98%  Weight: 171 lb 4 oz (77.7 kg)   Wt Readings from Last 3 Encounters:  10/26/23 171 lb 4 oz (77.7 kg)  10/19/23 167 lb 11.2 oz (76.1 kg)  10/14/23 176 lb 12.8 oz (80.2 kg)   Lab Results  Component Value Date   CREATININE 0.82 10/19/2023   CREATININE 0.98 10/18/2023   CREATININE 0.96 10/17/2023    PHYSICAL EXAM:  General: Well appearing. No resp difficulty HEENT: normal Neck: supple, no JVD Cor: Regular rhythm, rate. No rubs, gallops or murmurs Lungs: clear Abdomen: soft, nontender, nondistended. Extremities: no cyanosis, clubbing, rash, edema Neuro: alert & oriented X 3. Moves all 4 extremities w/o difficulty. Affect pleasant   ECG: not done   ASSESSMENT & PLAN:  1: Ischemic heart failure with reduced ejection fraction- - CAD/ STEMI 2015 with PCI to LAD - staged PCI/DES was placed on 10/18/23  - NYHA class II - euvolemic -  weighing daily - Echo 02/16/22: EF 65-70% - Echo 10/16/23: EF 30-35%, Grade I DD, borderline dilatation or aortic root at 39 mm, IVC normal - continue jardiance 10mg  daily - continue furosemide 40mg  daily; consider decreasing this to 20mg  daily - begin losartan 12.5mg  daily; this will have less effect on BP than entresto - continue metoprolol succinate 12.5mg  daily - current BP will not allow for MRA - reading food labels for sodium content - can return to [employer] as long as he picks jobs that don't require heavy lifting or climbing flights of stairs - BNP 07/08/23 reviewed and was 65.1  2: CAD- - staged PCI/DES was placed on 10/18/23  - sees cardiology Mayford Knife) 03/25 - had inpatient cardiac rehab; 10/19/23 was referred to [facility] outpatient cardiac rehab - continue clopidogrel 75mg  daily - continue ASA 81mg  daily  3: HTN- - BP 105/67 - will be getting established with primary care at the [facility] - BMET 10/19/23 reviewed and showed sodium 137, potassium 4.2, creatinine 0.82 & GFR >60 - BMET today  4: Hyperlipidemia- - LDL 10/14/23 reviewed and was 104 - lipo (a) 10/16/23 reviewed and was 53.6 - advised to start atorvastatin 80mg  daily (has this at home but got confused about taking it) - continue ezetimibe 10mg  daily  5: Tobacco use- - was smoking 1.5 ppd cigarettes - now smoking < 1/2 ppd - discussed health coach to assist with smoking cessation but he declines for now - complete cessation discussed   Since he has upcoming cardiology visit in March, will have him return here in April, sooner if needed.   Delma Freeze, FNP 10/26/23\

## 2023-10-26 NOTE — Patient Instructions (Addendum)
Labs done today. We will contact you only if your labs are abnormal.  START Losartan 12.5mg  (1/2 tablet) by mouth daily.  No other medication changes were made. Please continue all current medications as prescribed.  Your physician recommends that you schedule a follow-up appointment in: 2 months  If you have any questions or concerns before your next appointment please send [place] a message through [place] or call our office at [phone].    TO LEAVE A MESSAGE FOR THE NURSE SELECT OPTION 2, PLEASE LEAVE A MESSAGE INCLUDING: YOUR NAME DATE OF BIRTH CALL BACK NUMBER REASON FOR CALL**this is important as we prioritize the call backs  YOU WILL RECEIVE A CALL BACK THE SAME DAY AS LONG AS YOU CALL BEFORE 4:00 PM   Do the following things EVERYDAY: Weigh yourself in the morning before breakfast. Write it down and keep it in a log. Take your medicines as prescribed Eat low salt foods--Limit salt (sodium) to 2000 mg per day.  Stay as active as you can everyday Limit all fluids for the day to less than 2 liters   At the [facility], you and your health needs are our priority. As part of our continuing mission to provide you with exceptional heart care, we have created designated Provider Care Teams. These Care Teams include your primary Cardiologist (physician) and Advanced Practice Providers (APPs- Physician Assistants and Nurse Practitioners) who all work together to provide you with the care you need, when you need it.   You may see any of the following providers on your designated Care Team at your next follow up: Dr Arvilla Meres Dr Marca Ancona Dr. Marcos Eke, NP Robbie Lis, [place] [facility] [place], [place] Brynda Peon, NP Karle Plumber, PharmD   Please be sure to bring in all your medications bottles to every appointment.    Thank you for choosing [facility]   \

## 2023-11-14 ENCOUNTER — Other Ambulatory Visit: Payer: Self-pay

## 2023-11-15 ENCOUNTER — Other Ambulatory Visit: Payer: Self-pay

## 2023-11-15 ENCOUNTER — Ambulatory Visit: Payer: Self-pay | Admitting: Cardiology

## 2023-12-13 ENCOUNTER — Other Ambulatory Visit: Payer: Self-pay

## 2023-12-16 ENCOUNTER — Other Ambulatory Visit (HOSPITAL_COMMUNITY): Payer: Self-pay

## 2023-12-16 ENCOUNTER — Ambulatory Visit: Payer: Self-pay | Attending: Cardiology | Admitting: Pharmacist

## 2023-12-16 ENCOUNTER — Encounter: Payer: Self-pay | Admitting: Pharmacist

## 2023-12-16 DIAGNOSIS — E785 Hyperlipidemia, unspecified: Secondary | ICD-10-CM

## 2023-12-16 LAB — LIPID PANEL
Chol/HDL Ratio: 3.2 ratio (ref 0.0–5.0)
Cholesterol, Total: 96 mg/dL — ABNORMAL LOW (ref 100–199)
HDL: 30 mg/dL — ABNORMAL LOW (ref >39)
LDL Chol Calc (NIH): 53 mg/dL (ref 0–99)
Triglycerides: 56 mg/dL (ref 0–149)
VLDL Cholesterol Cal: 13 mg/dL (ref 5–40)

## 2023-12-16 NOTE — Assessment & Plan Note (Addendum)
 Assessment:  LDL goal: < 55 mg/dl last LDLc 90 mg /dl while on Lipitor 80 mg daily (02/2023) LDLc went up 104 -09/2023 ( non compliance to Lipitor)  Zetia added to high intensity statin Jan 2025  Patient self discontinued Lipitor when started Zetia ( thought it was replacement) Restarted Lipitor and Zetia mid Feb 2025  Discussed next potential options (PCSK-9 inhibitors, bempedoic acid and inclisiran); cost, dosing efficacy, side effects  Reiterated significance of healthy diet and regular exercise   Plan: Continue taking current medications (Lipitor 80 mg and Zetia 10 mg daily)  Will get updated lipid level  in few weeks   He does not have any insurance, has applied for disability and got accepted at [place] but has not seen any health care provider Patient to call us when he has insurance issue sorted so we can initiate PA for [id number]  F/u with scheduled on 02/15/2024

## 2023-12-16 NOTE — Progress Notes (Signed)
 Patient ID: Kenneth Medina.                 DOB: 19-Feb-1971                    MRN: [id number]      HPI: Kenneth Medina. is a 52 y.o. male patient referred to lipid clinic by Dr.Thukkani . PMH is significant for CAD,CHF, hypertension, hx of STEMI 2015 with PCI to LAD , tobacco use, hyperlipidemia, mild sleep apnea.   LHC on 10/15/23 with chronically occluded mid LAD stent, high grade mid right RCA lesion. LVEDP 43 and severe LV dysfunction. Admitted to the hospital for diuresis, ECHO/GDMT titration, and planned PCI to RCA. ECHO 10/16/23 showed LVEF 30-35%, RWMA consistent with LAD infarction vs Takotsubo, G1DD, RV normal. Completed staged PCI to RCA on 10/18/23. DAPT for one year.   At last visit with NP pt reported not taking Lipitor so Lipitor 80 mg was restarted and Zetia was continued. 09/2023 LDL  104 mg/dl TC 409 mg/dl while on atorvastatin ( but was not taking everyday)  02/2023 LDL 90 mg/dl and TC 811 mg/dl while on atorvastatin  Patient presented today for lipid clinic. Before feb procedure he wasonly taking Lipitor 80 mg and after procedure Zetia was added. For brief period of time he had stopped taking Lipitor thinking Zetia was replacement of Lipitor but  from mid feb onward he has restarted Lipitor again. He does not have any insurance, has applied for disability and got accepted at [place] but has not seen any health care provider. Discussed significance of diet and exercise in depth.   Reviewed options for lowering LDL cholesterol, including  PCSK-9 inhibitors, bempedoic acid and inclisiran.  Discussed mechanisms of action, dosing, side effects and potential decreases in LDL cholesterol.  Also reviewed cost information and potential options for patient assistance.   As patient does not have active insurance in place we will wait till we make any changes to the current medications.   Current Medications: Lipitor 80 mg daily and Zetia 10 mg daily  Intolerances: none  Risk Factors:  Premature CAD, hx of STEMI 2015 with PCI to LAD , staged PCI to RCA on 2/3/25tobacco use, hyperlipidemia, LDL goal: <55 mg/dl   Diet: likes sodas - suggest to avoid  Try to eat grilled over fried food, avoid  processed food  Eats out twice a week  Exercise:  Walk- 4-5 miles per day everyday Family History:   Relation Problem Comments  Mother Metallurgist) Hypertension     Father (Alive)   Maternal Grandmother (Deceased) Cancer     Maternal Grandfather (Deceased) Cancer     Paternal Grandmother (Deceased)   Paternal Grandfather (Deceased)     Social History:  Alcohol: none Smoking: 1/2 - 1 pack per day - tried Chantix - gave Medina swings and Wellbutrin SR - leads to aggressive behavior    Labs:  Lipid Panel     Component Value Date/Time   CHOL 145 10/14/2023 1134   TRIG 90 10/14/2023 1134   HDL 24 (L) 10/14/2023 1134   CHOLHDL 6.0 (H) 10/14/2023 1134   CHOLHDL 4 03/19/2014 1116   VLDL 19.6 03/19/2014 1116   LDLCALC 104 (H) 10/14/2023 1134   LABVLDL 17 10/14/2023 1134    Past Medical History:  Diagnosis Date   Coronary artery disease    a. s/p ant STEMI 5/15 >> LHC (5/15):  prox D1 30-40%, LAD occluded, prox PDA 20-30%, mid to  dist Ant HK, EF 45% >>> PCI:  2.75x22 mm Resolute DES to mid LAD   Hyperlipidemia    Hypertension    Ischemic cardiomyopathy    a. EF 45% at [facility] at time of MI >> b. Echo (5/15):  Apical HK, no clot, EF 50%, mod LVH, normal RVF   MI (myocardial infarction) (HCC)    2015   Tobacco abuse     Current Outpatient Medications on File Prior to Visit  Medication Sig Dispense Refill   aspirin 81 MG chewable tablet Chew 1 tablet (81 mg total) by mouth daily.     atorvastatin (LIPITOR) 80 MG tablet Take 1 tablet (80 mg total) by mouth daily at 6 PM. Please call [phone] to schedule an appointment for future refills. Thank you. 1st attempt. (Patient not taking: Reported on 10/26/2023) 30 tablet 11   clopidogrel (PLAVIX) 75 MG tablet Take 1 tablet (75 mg  total) by mouth daily. 90 tablet 2   empagliflozin (JARDIANCE) 10 MG TABS tablet Take 1 tablet (10 mg total) by mouth daily. 90 tablet 1   ezetimibe (ZETIA) 10 MG tablet Take 1 tablet (10 mg total) by mouth daily. Please call [phone] to schedule an appointment for future refills. Thank you. 1st attempt. 30 tablet 11   furosemide (LASIX) 40 MG tablet Take 1 tablet (40 mg total) by mouth daily. 30 tablet 2   losartan (COZAAR) 25 MG tablet Take 0.5 tablets (12.5 mg total) by mouth daily. 45 tablet 3   metoprolol succinate (TOPROL-XL) 25 MG 24 hr tablet Take 0.5 tablets (12.5 mg total) by mouth daily. 45 tablet 1   nitroGLYCERIN (NITROSTAT) 0.4 MG SL tablet Place 1 tablet (0.4 mg total) under the tongue every 5 (five) minutes x 3 doses as needed for chest pain. 25 tablet 5   No current facility-administered medications on file prior to visit.    Allergies  Allergen Reactions   Ibuprofen Hives    Assessment/Plan:  1. Hyperlipidemia -  Problem  Hyperlipidemia Ldl Goal <55   Current Medications: Lipitor 80 mg daily and Zetia 10 mg daily  Intolerances: none  Risk Factors: Premature CAD, hx of STEMI 2015 with PCI to LAD , staged PCI to RCA on 2/3/25tobacco use, hyperlipidemia, LDL goal: <55 mg/dl     Hyperlipidemia LDL goal <55 Assessment:  LDL goal: < 55 mg/dl last LDLc 90 mg /dl while on Lipitor 80 mg daily (02/2023) LDLc went up 104 -09/2023 ( non compliance to Lipitor)  Zetia added to high intensity statin Jan 2025  Patient self discontinued Lipitor when started Zetia ( thought it was replacement) Restarted Lipitor and Zetia mid Feb 2025  Discussed next potential options (PCSK-9 inhibitors, bempedoic acid and inclisiran); cost, dosing efficacy, side effects  Reiterated significance of healthy diet and regular exercise   Plan: Continue taking current medications (Lipitor 80 mg and Zetia 10 mg daily)  Will get updated lipid level  in few weeks   He does not have any insurance,  has applied for disability and got accepted at [place] but has not seen any health care provider Patient to call us when he has insurance issue sorted so we can initiate PA for PCSK9i  F/u with scheduled on 02/15/2024      Thank you,  Carmela Hurt, Pharm.D [facility] [address] [place], [place] [address]  Phone: [phone]; Fax: [phone]

## 2023-12-16 NOTE — Patient Instructions (Signed)
 Your Results:             Your most recent labs Goal  Total Cholesterol 145 < 200  Triglycerides 90 < 150  HDL (happy/good cholesterol) 24 > 40  LDL (lousy/bad cholesterol 104 < 55   Medication changes: none  Continue taking Atorvastatin 80 mg daily and ezetimibe 10 mg daily.

## 2023-12-19 NOTE — Progress Notes (Deleted)
  Cardiology Office Note:   Date:  12/19/2023  ID:  Kenneth Medina., DOB 07-Jun-1971, MRN [id number] PCP: Aviva Kluver  [facility] Providers Cardiologist:  Tonny Bollman, MD { Click to update primary MD,subspecialty MD or APP then REFRESH:1}   History of Present Illness:   Kenneth Medina. is a 53 y.o. male ***  Discussed the use of AI scribe software for clinical note transcription with the patient, who gave verbal consent to proceed.  History of Present Illness      Today patient denies chest pain, shortness of breath, lower extremity edema, fatigue, palpitations, melena, hematuria, hemoptysis, diaphoresis, weakness, presyncope, syncope, orthopnea, and PND.   Studies Reviewed:    EKG:        ***  Risk Assessment/Calculations:   {Does this patient have ATRIAL FIBRILLATION?:[phone]} No BP recorded.  {Refresh Note OR Click here to enter BP  :1}***        Physical Exam:   VS:  There were no vitals taken for this visit.   Wt Readings from Last 3 Encounters:  10/26/23 171 lb 4 oz (77.7 kg)  10/19/23 167 lb 11.2 oz (76.1 kg)  10/14/23 176 lb 12.8 oz (80.2 kg)     Physical Exam  Physical Exam    ASSESSMENT AND PLAN:     Assessment and Plan Assessment & Plan       {The patient has an active order for outpatient cardiac rehabilitation.   Please indicate if the patient is ready to start. Do NOT delete this.  It will auto delete.  Refresh note, then sign.              Click here to document readiness and see contraindications.  :1}  Cardiac Rehabilitation Eligibility Assessment      {Are you ordering a CV Procedure (e.g. stress test, cath, DCCV, TEE, etc)?   Press F2        :[id number]}   Signed, Perlie Gold, PA-C

## 2023-12-20 ENCOUNTER — Other Ambulatory Visit: Payer: Self-pay

## 2023-12-20 ENCOUNTER — Ambulatory Visit: Payer: Self-pay | Admitting: Cardiology

## 2023-12-20 ENCOUNTER — Other Ambulatory Visit: Payer: Self-pay | Admitting: Cardiovascular Disease

## 2023-12-20 DIAGNOSIS — I255 Ischemic cardiomyopathy: Secondary | ICD-10-CM

## 2023-12-23 ENCOUNTER — Encounter: Payer: Self-pay | Admitting: Cardiovascular Disease

## 2023-12-27 ENCOUNTER — Telehealth: Payer: Self-pay | Admitting: Family

## 2023-12-27 NOTE — Telephone Encounter (Incomplete)
 Called to confirm/remind patient of their appointment at the [facility] on 12/28/23.   Appointment:   [x] Confirmed  [] Left mess   [] No answer/No voice mail  [] Phone not in service  Patient reminded to bring all medications and/or complete list.  Confirmed patient has transportation. Gave directions, instructed to utilize valet parking.

## 2023-12-27 NOTE — Progress Notes (Unsigned)
 Advanced [facility] Note   Referring Physician: recent admission PCP: Pcp, No (will be starting at [facility])  Cardiologist: Arnoldo Lapping, MD (last seen 01/25)  Chief Complaint: shortness of breath  HPI:  Kenneth Medina is a 53 y/o male with a history of CAD/ STEMI 2015 with PCI to LAD, hyperlipidemia, tobacco use, HTN and chronic heart failure.   He presented to outpatient cardiology on 1/30 with complaints of chest pain. Had a similar episode in 08/2023 where he went to the [facility] ED for evaluation but ended up leaving after waiting 4 hours in the the lobby of the ED when his chest pain resolved. Also complained of shortness of breath and orthopnea. Denied edema, lightheadedness, or syncope. EKG showed interval anterolateral infarct. Sent for urgent cardiac cath.    LHC on 10/15/23 with chronically occluded mid LAD stent, high grade mid right RCA lesion. LVEDP 43 and severe LV dysfunction. Admitted to the hospital for diuresis, ECHO/GDMT titration, and planned PCI to RCA. ECHO 10/16/23 showed LVEF 30-35%, RWMA consistent with LAD infarction vs Takotsubo, G1DD, RV normal. Completed staged PCI to RCA on 10/18/23. DAPT for one year.   Echo 02/16/22: EF 65-70% Echo 10/16/23: EF 30-35%, Grade I DD, borderline dilatation or aortic root at 39 mm, IVC normal  He presents today for his initial HF visit with a chief complaint of minimal shortness of breath, improved from recent admission. Has associated fatigue (improving) and knee/ neck pain. Denies chest pain, cough, palpitations, abdominal distention, pedal edema, dizziness or weight gain. Reports sleeping well on 1 pillow.   [facility] Working with attorney regarding [facility] process.    Was smoking 1.5 ppd cigarettes, now smoking < 1/2 ppd. Voices wanting to quit but stress level is high. 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  Says that at [employer] he would have to pick up the 40 pack of waters.   Review of Systems: [y] = yes, [ ]  = no   General: Weight gain [ ] ; Weight loss [ ] ; Anorexia [ ] ; Fatigue Davis.Dad ]; Fever [ ] ; Chills [ ] ; Weakness [ ]   Cardiac: Chest pain/pressure [ ] ; Resting SOB [ ] ; Exertional SOB [ y]; Orthopnea [ ] ; Pedal Edema [ ] ; Palpitations [ ] ; Syncope [ ] ; Presyncope [ ] ; Paroxysmal nocturnal dyspnea[ ]   Pulmonary: Cough [ ] ; Wheezing[ ] ; Hemoptysis[ ] ; Sputum [ ] ; Snoring [ ]   GI: Vomiting[ ] ; Dysphagia[ ] ; Melena[ ] ; Hematochezia [ ] ; Heartburn[ ] ; Abdominal pain [ ] ; Constipation [ ] ; Diarrhea [ ] ; BRBPR [ ]   GU: Hematuria[ ] ; Dysuria [ ] ; Nocturia[ ]   Vascular: Pain in legs with walking [ ] ; Pain in feet with lying flat [ ] ; Non-healing sores [ ] ; Stroke [ ] ; TIA [ ] ; Slurred speech [ ] ;  Neuro: Headaches[ ] ; Vertigo[ ] ; Seizures[ ] ; Paresthesias[ ] ;Blurred vision [ ] ; Diplopia [ ] ; Vision changes [ ]   Ortho/Skin: Arthritis [ ] ; Joint pain Davis.Dad ]; Muscle pain [ ] ; Joint swelling [ ] ; Back Pain [ ] ; Rash [ ]   Psych: Depression[ ] ; Anxiety[ ]   Heme: Bleeding problems [ ] ; Clotting disorders [ ] ; Anemia [ ]   Endocrine: Diabetes [ ] ; Thyroid dysfunction[ ]    Past Medical History:  Diagnosis Date   Coronary artery disease    a. s/p ant STEMI 5/15 >> LHC (5/15):  prox D1 30-40%, LAD  occluded, prox PDA 20-30%, mid to dist Ant HK, EF 45% >>> PCI:  2.75x22 mm Resolute DES to mid LAD   Hyperlipidemia    Hypertension    Ischemic cardiomyopathy    a. EF 45% at [facility] at time of MI >> b. Echo (5/15):  Apical HK, no clot, EF 50%, mod LVH, normal RVF   MI (myocardial infarction) (HCC)    2015   Tobacco abuse     Current Outpatient Medications  Medication Sig Dispense Refill   aspirin 81 MG chewable tablet Chew 1 tablet (81 mg total) by mouth daily.     atorvastatin (LIPITOR) 80 MG tablet Take 1 tablet (80 mg total) by mouth daily at 6 PM. Please call [phone] to schedule an appointment for future  refills. Thank you. 1st attempt. (Patient not taking: Reported on 10/26/2023) 30 tablet 11   clopidogrel (PLAVIX) 75 MG tablet Take 1 tablet (75 mg total) by mouth daily. 90 tablet 2   empagliflozin (JARDIANCE) 10 MG TABS tablet Take 1 tablet (10 mg total) by mouth daily. 90 tablet 1   ezetimibe (ZETIA) 10 MG tablet Take 1 tablet (10 mg total) by mouth daily. Please call [phone] to schedule an appointment for future refills. Thank you. 1st attempt. 30 tablet 11   furosemide (LASIX) 40 MG tablet Take 1 tablet (40 mg total) by mouth daily. 30 tablet 2   losartan (COZAAR) 25 MG tablet Take 0.5 tablets (12.5 mg total) by mouth daily. 45 tablet 3   metoprolol succinate (TOPROL-XL) 25 MG 24 hr tablet Take 0.5 tablets (12.5 mg total) by mouth daily. 45 tablet 1   nitroGLYCERIN (NITROSTAT) 0.4 MG SL tablet Place 1 tablet (0.4 mg total) under the tongue every 5 (five) minutes x 3 doses as needed for chest pain. 25 tablet 5   No current facility-administered medications for this visit.    Allergies  Allergen Reactions   Ibuprofen Hives      Social History   Socioeconomic History   Marital status: Married    Spouse name: Adell Hones   Number of children: 2   Years of education: Not on file   Highest education level: High school graduate  Occupational History   Occupation: Industrial/product designer  Tobacco Use   Smoking status: Every Day    Current packs/day: 0.50    Average packs/day: 0.5 packs/day for 30.0 years (15.0 ttl pk-yrs)    Types: Cigarettes   Smokeless tobacco: Never  Vaping Use   Vaping status: Never Used  Substance and Sexual Activity   Alcohol use: No   Drug use: No   Sexual activity: Not on file  Other Topics Concern   Not on file  Social History Narrative   The patient is married. He works as a Surveyor, minerals for [employer]. He does not drink alcohol or use illicit drugs. He does smoke cigarettes one half pack per day.   Social Drivers of Corporate investment banker  Strain: Low Risk  (10/19/2023)   Overall Financial Resource Strain (CARDIA)    Difficulty of Paying Living Expenses: Not very hard  Food Insecurity: No Food Insecurity (10/15/2023)   Hunger Vital Sign    Worried About Running Out of Food in the Last Year: Never true    Ran Out of Food in the Last Year: Never true  Transportation Needs: No Transportation Needs (10/19/2023)   PRAPARE - Administrator, Civil Service (Medical): No    Lack of Transportation (Non-Medical):  No  Physical Activity: Not on file  Stress: Not on file  Social Connections: Not on file  Intimate Partner Violence: Not At Risk (10/15/2023)   Humiliation, Afraid, Rape, and Kick questionnaire    Fear of Current or Ex-Partner: No    Emotionally Abused: No    Physically Abused: No    Sexually Abused: No      Family History  Problem Relation Age of Onset   Hypertension Mother    Cancer Maternal Grandmother    Cancer Maternal Grandfather    Heart attack Neg Hx    Stroke Neg Hx    There were no vitals filed for this visit.  Wt Readings from Last 3 Encounters:  10/26/23 171 lb 4 oz (77.7 kg)  10/19/23 167 lb 11.2 oz (76.1 kg)  10/14/23 176 lb 12.8 oz (80.2 kg)   Lab Results  Component Value Date   CREATININE 0.98 10/26/2023   CREATININE 0.82 10/19/2023   CREATININE 0.98 10/18/2023    PHYSICAL EXAM:  General: Well appearing. No resp difficulty HEENT: normal Neck: supple, no JVD Cor: Regular rhythm, rate. No rubs, gallops or murmurs Lungs: clear Abdomen: soft, nontender, nondistended. Extremities: no cyanosis, clubbing, rash, edema Neuro: alert & oriented X 3. Moves all 4 extremities w/o difficulty. Affect pleasant   ECG: not done   ASSESSMENT & PLAN:  1: Ischemic heart failure with reduced ejection fraction- - CAD/ STEMI 2015 with PCI to LAD - staged PCI/DES was placed on 10/18/23  - NYHA class II - euvolemic - weighing daily - Echo 02/16/22: EF 65-70% - Echo 10/16/23: EF 30-35%, Grade I  DD, borderline dilatation or aortic root at 39 mm, IVC normal - continue jardiance 10mg  daily - continue furosemide 40mg  daily; consider decreasing this to 20mg  daily - begin losartan 12.5mg  daily; this will have less effect on BP than entresto - continue metoprolol succinate 12.5mg  daily - current BP will not allow for MRA - reading food labels for sodium content - can return to [employer] as long as he picks jobs that don't require heavy lifting or climbing flights of stairs - BNP 07/08/23 reviewed and was 65.1  2: CAD- - staged PCI/DES was placed on 10/18/23  - sees cardiology Broadus Canes) 03/25 - had inpatient cardiac rehab; 10/19/23 was referred to [facility] outpatient cardiac rehab - continue clopidogrel 75mg  daily - continue ASA 81mg  daily  3: HTN- - BP 105/67 - will be getting established with primary care at the [facility] - BMET 10/19/23 reviewed and showed sodium 137, potassium 4.2, creatinine 0.82 & GFR >60 - BMET today  4: Hyperlipidemia- - LDL 10/14/23 reviewed and was 104 - lipo (a) 10/16/23 reviewed and was 53.6 - advised to start atorvastatin 80mg  daily (has this at home but got confused about taking it) - continue ezetimibe 10mg  daily  5: Tobacco use- - was smoking 1.5 ppd cigarettes - now smoking < 1/2 ppd - discussed health coach to assist with smoking cessation but he declines for now - complete cessation discussed   Since he has upcoming cardiology visit in March, will have him return here in April, sooner if needed.   Charlette Console, FNP 12/27/23\

## 2023-12-28 ENCOUNTER — Other Ambulatory Visit: Payer: Self-pay

## 2023-12-28 ENCOUNTER — Ambulatory Visit: Payer: Self-pay | Attending: Family | Admitting: Family

## 2023-12-28 ENCOUNTER — Encounter: Payer: Self-pay | Admitting: Family

## 2023-12-28 ENCOUNTER — Other Ambulatory Visit (HOSPITAL_COMMUNITY): Payer: Self-pay

## 2023-12-28 VITALS — BP 100/58 | HR 64 | Wt 167.0 lb

## 2023-12-28 DIAGNOSIS — F17210 Nicotine dependence, cigarettes, uncomplicated: Secondary | ICD-10-CM | POA: Insufficient documentation

## 2023-12-28 DIAGNOSIS — I252 Old myocardial infarction: Secondary | ICD-10-CM | POA: Insufficient documentation

## 2023-12-28 DIAGNOSIS — Z7902 Long term (current) use of antithrombotics/antiplatelets: Secondary | ICD-10-CM | POA: Insufficient documentation

## 2023-12-28 DIAGNOSIS — I25119 Atherosclerotic heart disease of native coronary artery with unspecified angina pectoris: Secondary | ICD-10-CM | POA: Insufficient documentation

## 2023-12-28 DIAGNOSIS — R059 Cough, unspecified: Secondary | ICD-10-CM | POA: Insufficient documentation

## 2023-12-28 DIAGNOSIS — I5022 Chronic systolic (congestive) heart failure: Secondary | ICD-10-CM | POA: Insufficient documentation

## 2023-12-28 DIAGNOSIS — Z7982 Long term (current) use of aspirin: Secondary | ICD-10-CM | POA: Insufficient documentation

## 2023-12-28 DIAGNOSIS — I110 Hypertensive heart disease with heart failure: Secondary | ICD-10-CM | POA: Insufficient documentation

## 2023-12-28 DIAGNOSIS — R002 Palpitations: Secondary | ICD-10-CM | POA: Insufficient documentation

## 2023-12-28 DIAGNOSIS — Z79899 Other long term (current) drug therapy: Secondary | ICD-10-CM | POA: Insufficient documentation

## 2023-12-28 DIAGNOSIS — Z720 Tobacco use: Secondary | ICD-10-CM

## 2023-12-28 DIAGNOSIS — I10 Essential (primary) hypertension: Secondary | ICD-10-CM

## 2023-12-28 DIAGNOSIS — Z955 Presence of coronary angioplasty implant and graft: Secondary | ICD-10-CM | POA: Insufficient documentation

## 2023-12-28 DIAGNOSIS — F419 Anxiety disorder, unspecified: Secondary | ICD-10-CM | POA: Insufficient documentation

## 2023-12-28 DIAGNOSIS — E785 Hyperlipidemia, unspecified: Secondary | ICD-10-CM | POA: Insufficient documentation

## 2023-12-28 MED ORDER — FUROSEMIDE 20 MG PO TABS
20.0000 mg | ORAL_TABLET | Freq: Every day | ORAL | 11 refills | Status: DC
  Filled 2023-12-28: qty 30, 30d supply, fill #0

## 2023-12-28 MED ORDER — BUPROPION HCL ER (XL) 150 MG PO TB24
150.0000 mg | ORAL_TABLET | Freq: Every day | ORAL | 5 refills | Status: DC
  Filled 2023-12-28: qty 30, 30d supply, fill #0

## 2023-12-28 MED ORDER — FUROSEMIDE 20 MG PO TABS
20.0000 mg | ORAL_TABLET | Freq: Every day | ORAL | 11 refills | Status: AC
  Filled 2023-12-28: qty 30, 20d supply, fill #0
  Filled 2024-02-08: qty 30, 20d supply, fill #1
  Filled 2024-03-01: qty 30, 20d supply, fill #2
  Filled 2024-04-08 – 2024-04-20 (×2): qty 30, 20d supply, fill #3
  Filled 2024-05-21: qty 30, 20d supply, fill #4
  Filled 2024-06-06 – 2024-07-03 (×3): qty 30, 20d supply, fill #5
  Filled 2024-07-30: qty 30, 20d supply, fill #6
  Filled 2024-08-16 (×2): qty 30, 20d supply, fill #7
  Filled 2024-09-05: qty 30, 20d supply, fill #8

## 2023-12-28 NOTE — Patient Instructions (Signed)
 Medication Changes:  START WELLBUTRIN 150 MG ONCE DAILY  DECREASE LASIX TO 20 MG ONCE DAILY. PLEASE TAKE AN ADDITIONAL 20 MG IF NEEDED FOR WEIGHT GAIN OR SWELLING   Lab Work:  Go DOWN to LOWER LEVEL (LL) to have your blood work completed inside of [employer] office.  We will only call you if the results are abnormal or if the provider would like to make medication changes.   Testing/Procedures:  Your provider has recommended that  you wear a Zio Patch for 7 days.  This monitor will record your heart rhythm for our review.  IF you have any symptoms while wearing the monitor please press the button.  If you have any issues with the patch or you notice a red or orange light on it please call the company at [phone].  Once you remove the patch please mail it back to the company as soon as possible so we can get the results.    Special Instructions // Education:  We are currently out of Zio monitors and waiting on a shipment to come in. We will call you to come get your monitor placed once the shipment arrives. Please be expecting a call from us  soon.   Follow-Up in: 1  month with [facility] after Zio is placed.  At the [facility], you and your health needs are our priority. We have a designated team specialized in the treatment of Heart Failure. This Care Team includes your primary Heart Failure Specialized Cardiologist (physician), Advanced Practice Providers (APPs- Physician Assistants and Nurse Practitioners), and Pharmacist who all work together to provide you with the care you need, when you need it.   You may see any of the following providers on your designated Care Team at your next follow up:  Dr. Jules Oar Dr. Peder Bourdon Dr. Alwin Baars Dr. Judyth Nunnery Shawnee Dellen, FNP Bevely Brush, RPH-CPP  Please be sure to bring in all your medications bottles to every appointment.   Need to Contact Us :  If you have any questions or concerns  before your next appointment please send us  a message through [place] or call our office at [phone].    TO LEAVE A MESSAGE FOR THE NURSE SELECT OPTION 2, PLEASE LEAVE A MESSAGE INCLUDING: YOUR NAME DATE OF BIRTH CALL BACK NUMBER REASON FOR CALL**this is important as we prioritize the call backs  YOU WILL RECEIVE A CALL BACK THE SAME DAY AS LONG AS YOU CALL BEFORE 4:00 PM

## 2023-12-28 NOTE — Progress Notes (Signed)
 [facility] - HEART FAILURE CLINIC - PHARMACIST COUNSELING NOTE  Adherence Assessment  Do you ever forget to take your medication? [] Yes [x] No  Do you ever skip doses due to side effects?  Heart racing for a few minutes  No swelling   Lifting things - can't catch a breath; SOB with activity  Stress - job, not able to work, money    [] Yes [] No  Do you have trouble affording your medicines? [] Yes [x] No  Are you ever unable to pick up your medication due to transportation difficulties? [] Yes [x] No  Do you ever stop taking your medications because you don't believe they are helping? [] Yes [x] No  Do you check your weight daily?  167 lbs [x] Yes [] No  Do you check your blood pressure daily?  100/58 - low  [] Yes [x] No  Adherence strategy: Tray uses    Barriers to obtaining medications: None reported; no refills    Vital signs: HR ***, BP ***, weight (pounds) *** ECHO: Date ***, EF ***, notes *** Cath: Date ***, EF ***, notes *** Renal function: Date ***, GFR ***  Current Guideline-Directed Medical Therapy/Evidence Based Medicine  ACE/ARB/ARNI: {AR_ARNI/ACEi/ARB:25599} Target dose:  Beta Blocker: {AR_Beta blocker:25598} Target dose:  Aldosterone Antagonist: {AR_Mineralocorticoid receptor antagonists:25602} Target dose:  SGLT2i: {AR_SGLT2i:25603} Target dose:   Diuretic: {AR_Diuretics:25604}  Others:   ASSESSMENT *** year old {Gender Description:210950033} who presents to the HF clinic ***. PMH is significant for ***.   Recent ED visit or hospitalization (past 6 months): Date - ***, CC - *** , Admission Dx (if applicable) - ***    COUNSELING POINTS/CLINICAL PEARLS  Can use the HFCMEDCOUNSELING dot phrase here   PLAN    Time spent: *** minutes

## 2023-12-29 ENCOUNTER — Encounter: Payer: Self-pay | Admitting: Family

## 2023-12-29 LAB — BASIC METABOLIC PANEL WITH GFR
BUN/Creatinine Ratio: 10 (ref 9–20)
BUN: 10 mg/dL (ref 6–24)
CO2: 24 mmol/L (ref 20–29)
Calcium: 9.2 mg/dL (ref 8.7–10.2)
Chloride: 101 mmol/L (ref 96–106)
Creatinine, Ser: 0.96 mg/dL (ref 0.76–1.27)
Glucose: 77 mg/dL (ref 70–99)
Potassium: 4.9 mmol/L (ref 3.5–5.2)
Sodium: 141 mmol/L (ref 134–144)
eGFR: 94 mL/min/{1.73_m2} (ref >59)

## 2024-01-09 ENCOUNTER — Other Ambulatory Visit: Payer: Self-pay

## 2024-01-17 ENCOUNTER — Other Ambulatory Visit: Payer: Self-pay

## 2024-01-31 ENCOUNTER — Telehealth: Payer: Self-pay | Admitting: Cardiology

## 2024-01-31 NOTE — Telephone Encounter (Signed)
 Called to confirm/remind patient of their appointment at the [facility] on 02/01/24.   Appointment:   [x] Confirmed  [] Left mess   [] No answer/No voice mail  [] VM Full/unable to leave message  [] Phone not in service  Patient reminded to bring all medications and/or complete list.  Confirmed patient has transportation. Gave directions, instructed to utilize valet parking.

## 2024-02-01 ENCOUNTER — Ambulatory Visit: Payer: Self-pay | Attending: Cardiology | Admitting: Cardiology

## 2024-02-01 ENCOUNTER — Encounter: Payer: Self-pay | Admitting: Cardiology

## 2024-02-01 ENCOUNTER — Encounter: Payer: Self-pay | Admitting: Family

## 2024-02-01 ENCOUNTER — Other Ambulatory Visit: Payer: Self-pay

## 2024-02-01 VITALS — BP 100/61 | HR 72 | Wt 166.2 lb

## 2024-02-01 DIAGNOSIS — I25119 Atherosclerotic heart disease of native coronary artery with unspecified angina pectoris: Secondary | ICD-10-CM

## 2024-02-01 DIAGNOSIS — R0601 Orthopnea: Secondary | ICD-10-CM | POA: Insufficient documentation

## 2024-02-01 DIAGNOSIS — Z955 Presence of coronary angioplasty implant and graft: Secondary | ICD-10-CM | POA: Insufficient documentation

## 2024-02-01 DIAGNOSIS — Z7982 Long term (current) use of aspirin: Secondary | ICD-10-CM | POA: Insufficient documentation

## 2024-02-01 DIAGNOSIS — I110 Hypertensive heart disease with heart failure: Secondary | ICD-10-CM | POA: Insufficient documentation

## 2024-02-01 DIAGNOSIS — F17200 Nicotine dependence, unspecified, uncomplicated: Secondary | ICD-10-CM | POA: Insufficient documentation

## 2024-02-01 DIAGNOSIS — Z720 Tobacco use: Secondary | ICD-10-CM

## 2024-02-01 DIAGNOSIS — Z79899 Other long term (current) drug therapy: Secondary | ICD-10-CM | POA: Insufficient documentation

## 2024-02-01 DIAGNOSIS — I2510 Atherosclerotic heart disease of native coronary artery without angina pectoris: Secondary | ICD-10-CM | POA: Insufficient documentation

## 2024-02-01 DIAGNOSIS — R059 Cough, unspecified: Secondary | ICD-10-CM | POA: Insufficient documentation

## 2024-02-01 DIAGNOSIS — I255 Ischemic cardiomyopathy: Secondary | ICD-10-CM | POA: Insufficient documentation

## 2024-02-01 DIAGNOSIS — Z7902 Long term (current) use of antithrombotics/antiplatelets: Secondary | ICD-10-CM | POA: Insufficient documentation

## 2024-02-01 DIAGNOSIS — I5022 Chronic systolic (congestive) heart failure: Secondary | ICD-10-CM | POA: Insufficient documentation

## 2024-02-01 MED ORDER — METOPROLOL SUCCINATE ER 25 MG PO TB24
25.0000 mg | ORAL_TABLET | Freq: Every day | ORAL | 1 refills | Status: DC
  Filled 2024-02-01 – 2024-02-08 (×2): qty 90, 90d supply, fill #0
  Filled 2024-05-05: qty 30, 30d supply, fill #1
  Filled 2024-06-06 – 2024-07-03 (×4): qty 30, 30d supply, fill #2
  Filled 2024-07-30 – 2024-07-31 (×2): qty 30, 30d supply, fill #3

## 2024-02-01 NOTE — Patient Instructions (Signed)
 Medication Changes:  INCREASE Metoprolol  to 25 MG once daily  Lab Work:  Labs done today, your results will be available in MyChart, we will contact you for abnormal readings.   Testing/Procedures:  Your physician has requested that you have an echocardiogram. Echocardiography is a painless test that uses sound waves to create images of your heart. It provides your doctor with information about the size and shape of your heart and how well your heart's chambers and valves are working. This procedure takes approximately one hour. There are no restrictions for this procedure. Please do NOT wear cologne, perfume, aftershave, or lotions (deodorant is allowed). Please arrive to the [facility] 15 minutes prior to your appointment time.  Please note: We ask at that you not bring children with you during ultrasound (echo/ vascular) testing. Due to room size and safety concerns, children are not allowed in the ultrasound rooms during exams. Our front office staff cannot provide observation of children in our lobby area while testing is being conducted. An adult accompanying a patient to their appointment will only be allowed in the ultrasound room at the discretion of the ultrasound technician under special circumstances. We apologize for any inconvenience.   A message has been left with scheduling. If you haven't heard from anyone by tomorrow, please call our office to get this test scheduled.   Special Instructions // Education:  Do the following things EVERYDAY: Weigh yourself in the morning before breakfast. Write it down and keep it in a log. Take your medicines as prescribed Eat low salt foods--Limit salt (sodium) to 2000 mg per day.  Stay as active as you can everyday Limit all fluids for the day to less than 2 liters   Follow-Up in: 1 month with Dr. Bruce Caper    If you have any questions or concerns before your next appointment please send us  a message through [facility] or call  our office at [phone], If it is after office hours your call will be answered by our answering service and directed appropriately.     At the [facility], you and your health needs are our priority. We have a designated team specialized in the treatment of Heart Failure. This Care Team includes your primary Heart Failure Specialized Cardiologist (physician), Advanced Practice Providers (APPs- Physician Assistants and Nurse Practitioners), and Pharmacist who all work together to provide you with the care you need, when you need it.   You may see any of the following providers on your designated Care Team at your next follow up:  Dr. Jules Oar Dr. Peder Bourdon Dr. Alwin Baars Dr. Judyth Nunnery Nieves Bars, NP Ruddy Corral, [place] [address] [place], [place] Dennise Fitz, NP [place] Lee, NP [place] Dellen, NP Bevely Brush, PharmD

## 2024-02-01 NOTE — Progress Notes (Signed)
 ADVANCED HEART FAILURE CLINIC NOTE  Referring Physician: No ref. provider found  Primary Care: Pcp, No Primary Cardiologist: Dr. Arlester Ladd HF: Dr. Bruce Caper  CC: Heart failure with reduced EF  HPI: Kenneth Medina. is a 53 y.o. male with CAD (CTO of the LAD, PCI to RCA), HFrEF, HTN, tobacco use presenting today to establish care.   He reports that his symptoms started on 08/19/23. 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 Patient came to the [facility] ER where he was told that "everything was okay". He continued working but had severe dyspnea/anxiety/panic attacks. He was then seen by Dr. Arlester Ladd outpatient, where EKG showed anterolateral Q waves. He was admitted for LHC; LVEDP was 43. Patient was diuresed and underwent staged intervention to the RCA. LAD w/ proximal CTO. TTE with severely reduced LVEF and akinesis of the mid to distal anterolateral LV.   Today he reports feeling fairly well from a symptomatic standpoint. He has lost 20lbs since leaving the hospital (intentionally). He has no shortness of breath now; reports he can carry several grocery bags to his car with no limitations. He was unable to do this last month.    Activity level/exercise tolerance:  NYHA IIB Orthopnea:  Sleeps on 2 pillows Paroxysmal noctural dyspnea:  No Chest pain/pressure:  No Orthostatic lightheadedness:  No Palpitations:  No Lower extremity edema:  No Presyncope/syncope:  No Cough:  Mild dry cough  Pertinent Family & social hx:  Continues to smoke however has cut back significantly  PHYSICAL EXAM: Vitals:   02/01/24 1026  BP: 100/61  Pulse: 72  SpO2: 99%   GENERAL: NAD Lungs- CTA CARDIAC:  JVP: 6 cm          Normal rate with regular rhythm. no murmur.  Pulses 2+. no edema.  ABDOMEN: Soft, non-tender, non-distended.  EXTREMITIES: Warm and well perfused.  NEUROLOGIC: No obvious FND   DATA REVIEW  ECG: 10/19/23: NSR w/ anterolateral Q wave as per my  personal interpretation  ECHO: 10/16/23: LVEF 25%-30%, akinesis of the entire anterior/anterolateral myocardium as per my personal interpretation  CATH: 10/15/23:    Prox LAD to Mid LAD lesion is 100% stenosed.   Prox RCA to Mid RCA lesion is 80% stenosed.   Prox LAD lesion is 30% stenosed. PCI to the RCA on 10/18/23   ASSESSMENT & PLAN:  Heart failure with reduced EF Etiology of ZO:XWRUEAVW cardiomyopathy; CTO of the LAD with anterior Q waves NYHA class / AHA Stage:NYHA IIB Volume status & Diuretics: Euvolemic Vasodilators:Losartan  12.5mg  daily; limited by SBP Beta-Blocker:Increase toprol  to 25mg  daily UJW:[id number] due to elevated K; will repeat BMP today.  Cardiometabolic:Jardiance  10mg  daily Devices therapies & Valvulopathies:TTE ordered; I suspect he will need an ICD. Mid to distal LV / apex are thin and akinetic.  Advanced therapies:Doing well functionally; discussed briefly with him today with regards to importance of smoking cessation.   2. CAD - Followed by Dr. Arlester Ladd; CTO of the LAD after MI in 12/24.  - PCI to the RCA in 2/25 by Dr. Addie Holstein - Doing very well symptomatically - Continue DAPT, ASA+Plavix  - Continue lipitor  80mg  daily  3. Tobacco use - Discussed importance of tobacco cessation  I spent 60 minutes caring for this patient today including face to face time, ordering and reviewing labs, reviewing records from Dr. Arlester Ladd, hospitilization (11/09/23) noted above, discussing advanced therapies, counseling, education, seeing the patient, documenting in the record, and arranging follow ups.   Bradee Common  Kendrix Orman Advanced Heart Failure Mechanical Circulatory Support

## 2024-02-02 LAB — BASIC METABOLIC PANEL WITH GFR
BUN/Creatinine Ratio: 10 (ref 9–20)
BUN: 10 mg/dL (ref 6–24)
CO2: 24 mmol/L (ref 20–29)
Calcium: 9.1 mg/dL (ref 8.7–10.2)
Chloride: 102 mmol/L (ref 96–106)
Creatinine, Ser: 0.97 mg/dL (ref 0.76–1.27)
Glucose: 83 mg/dL (ref 70–99)
Potassium: 4.4 mmol/L (ref 3.5–5.2)
Sodium: 139 mmol/L (ref 134–144)
eGFR: 93 mL/min/{1.73_m2} (ref >59)

## 2024-02-02 LAB — BRAIN NATRIURETIC PEPTIDE: BNP: 293.3 pg/mL — ABNORMAL HIGH (ref 0.0–100.0)

## 2024-02-08 ENCOUNTER — Other Ambulatory Visit: Payer: Self-pay

## 2024-02-11 ENCOUNTER — Other Ambulatory Visit: Payer: Self-pay

## 2024-02-15 ENCOUNTER — Encounter: Payer: Self-pay | Admitting: Pharmacist

## 2024-02-15 ENCOUNTER — Ambulatory Visit: Payer: Self-pay | Attending: Cardiology | Admitting: Pharmacist

## 2024-02-15 DIAGNOSIS — E785 Hyperlipidemia, unspecified: Secondary | ICD-10-CM

## 2024-02-15 NOTE — Assessment & Plan Note (Signed)
 Assessment and Plan:  LDL goal: < 55 mg/dl last LDLc 53 mg/dl (02/2375) while on Lipitor  80 mg and Zetia  10 mg daily  Tolerates Zetia  and moderate/high intensity statins well without any side effects  Follows heart healthy diet and exercise daily  Will continue current therapy

## 2024-02-15 NOTE — Progress Notes (Signed)
 Patient ID: Kenneth Medina.                 DOB: [birth year]                    MRN: [id number]      HPI: Kenneth Medina. is a 53 y.o. male patient referred to lipid clinic by  Dr.Thukkani . PMH is significant for CAD,CHF, hypertension, hx of STEMI 2015 with PCI to LAD , tobacco use, hyperlipidemia, mild sleep apnea.   LHC on 10/15/23 with chronically occluded mid LAD stent, high grade mid right RCA lesion. LVEDP 43 and severe LV dysfunction. Admitted to the hospital for diuresis, ECHO/GDMT titration, and planned PCI to RCA. ECHO 10/16/23 showed LVEF 30-35%, RWMA consistent with LAD infarction vs Takotsubo, G1DD, RV normal. Completed staged PCI to RCA on 10/18/23. DAPT for one year  Patient was in to see me for lipid therapy optimization early April. Pt was interested in going on PCSK9i but at that point he did not have insurance so follow up lipid consultation was scheduled and with updated lipid lab   Recent lipid lab shows LDL at goal while on Lipitor  80 mg and Zetia  10 mg daily.  Patient reports he is very busy taking care of his wife and has not got chance to follow up with [facility] to get him covered. He is still sating from social security for disability coverage. He is not problem affording all the generic medications and he is on PAP for Jardiance .    Current Medications: Lipitor  80 mg daily and Zetia  10 mg daily  Intolerances: none  Risk Factors: Premature CAD, hx of STEMI 2015 with PCI to LAD , staged PCI to RCA on 2/3/25tobacco use, hyperlipidemia, LDL goal: <55 mg/dl   Diet: low salt low fat diet, grill more avoids fried food  Eats out - 2 times per week - pick better options  Snack- seasonal fruits    Exercise: 4-5 miles walk everyday   Family History:  Relation Problem Comments  Mother Metallurgist) Hypertension     Father (Alive)   Maternal Grandmother (Deceased) Cancer     Maternal Grandfather (Deceased) Cancer     Paternal Grandmother (Deceased)   Paternal Grandfather  (Deceased)      Social History:  Alcohol: none Smoking: 1/2 - 1 pack per day - tried Chantix  - gave mood swings and Wellbutrin  SR - leads to aggressive behavior   Smokes - 10 cig per day trying to cut down  Labs: Lpa 53.6 mg/dl 40/9811 Lipid Panel     Component Value Date/Time   CHOL 96 (L) 12/16/2023 0933   TRIG 56 12/16/2023 0933   HDL 30 (L) 12/16/2023 0933   CHOLHDL 3.2 12/16/2023 0933   CHOLHDL 4 03/19/2014 1116   VLDL 19.6 03/19/2014 1116   LDLCALC 53 12/16/2023 0933   LABVLDL 13 12/16/2023 0933    Past Medical History:  Diagnosis Date   Coronary artery disease    a. s/p ant STEMI 5/15 >> LHC (5/15):  prox D1 30-40%, LAD occluded, prox PDA 20-30%, mid to dist Ant HK, EF 45% >>> PCI:  2.75x22 mm Resolute DES to mid LAD   Hyperlipidemia    Hypertension    Ischemic cardiomyopathy    a. EF 45% at [facility] at time of MI >> b. Echo (5/15):  Apical HK, no clot, EF 50%, mod LVH, normal RVF   MI (myocardial infarction) (HCC)    2015  Tobacco abuse     Current Outpatient Medications on File Prior to Visit  Medication Sig Dispense Refill   aspirin  81 MG chewable tablet Chew 1 tablet (81 mg total) by mouth daily.     atorvastatin  (LIPITOR ) 80 MG tablet Take 1 tablet (80 mg total) by mouth daily at 6 PM. Please call [phone] to schedule an appointment for future refills. Thank you. 1st attempt. 30 tablet 11   clopidogrel  (PLAVIX ) 75 MG tablet Take 1 tablet (75 mg total) by mouth daily. 90 tablet 2   empagliflozin  (JARDIANCE ) 10 MG TABS tablet Take 1 tablet (10 mg total) by mouth daily. 90 tablet 1   ezetimibe  (ZETIA ) 10 MG tablet Take 1 tablet (10 mg total) by mouth daily. Please call [phone] to schedule an appointment for future refills. Thank you. 1st attempt. 30 tablet 11   furosemide  (LASIX ) 20 MG tablet Take 1 tablet (20 mg total) by mouth daily. Please take an additional 20 MG as needed for weight gain or swelling 30 tablet 11   losartan  (COZAAR ) 25 MG tablet Take 0.5  tablets (12.5 mg total) by mouth daily. 45 tablet 3   metoprolol  succinate (TOPROL -XL) 25 MG 24 hr tablet Take 1 tablet (25 mg total) by mouth daily. 90 tablet 1   nitroGLYCERIN  (NITROSTAT ) 0.4 MG SL tablet Place 1 tablet (0.4 mg total) under the tongue every 5 (five) minutes x 3 doses as needed for chest pain. 25 tablet 5   No current facility-administered medications on file prior to visit.    Allergies  Allergen Reactions   Ibuprofen Hives    Assessment/Plan:  1. Hyperlipidemia -  Problem  Hyperlipidemia Ldl Goal <55   Current Medications: Lipitor  80 mg daily and Zetia  10 mg daily  Intolerances: none  Risk Factors: Premature CAD, hx of STEMI 2015 with PCI to LAD , staged PCI to RCA on 2/3/25tobacco use, hyperlipidemia, LDL goal: <55 mg/dl     Hyperlipidemia LDL goal <55 Assessment and Plan:  LDL goal: < 55 mg/dl last LDLc 53 mg/dl (29/5621) while on Lipitor  80 mg and Zetia  10 mg daily  Tolerates Zetia  and moderate/high intensity statins well without any side effects  Follows heart healthy diet and exercise daily  Will continue current therapy     Thank you,  [name], Pharm.D [name] Mom. [facility] [address], [place], [place] [address] Phone: [phone]; Fax: [phone]

## 2024-02-22 ENCOUNTER — Other Ambulatory Visit (HOSPITAL_COMMUNITY): Payer: Self-pay | Admitting: Cardiology

## 2024-02-22 ENCOUNTER — Inpatient Hospital Stay (HOSPITAL_COMMUNITY)
Admission: RE | Admit: 2024-02-22 | Discharge: 2024-02-22 | Disposition: A | Discharge status: Home / Self Care | Payer: Self-pay | Source: Ambulatory Visit | Attending: Cardiology

## 2024-02-22 DIAGNOSIS — R002 Palpitations: Secondary | ICD-10-CM

## 2024-03-01 ENCOUNTER — Other Ambulatory Visit: Payer: Self-pay

## 2024-03-10 ENCOUNTER — Telehealth: Payer: Self-pay | Admitting: Family

## 2024-03-10 NOTE — Telephone Encounter (Signed)
 Called to confirm/remind patient of their appointment at the [facility] on 03/13/24.   Appointment:   [x] Confirmed  [] Left mess   [] No answer/No voice mail  [] VM Full/unable to leave message  [] Phone not in service  Patient reminded to bring all medications and/or complete list.  Confirmed patient has transportation. Gave directions, instructed to utilize valet parking.

## 2024-03-13 ENCOUNTER — Ambulatory Visit: Payer: Self-pay | Attending: Cardiology | Admitting: Cardiology

## 2024-03-13 ENCOUNTER — Other Ambulatory Visit: Payer: Self-pay

## 2024-03-13 ENCOUNTER — Encounter: Payer: Self-pay | Admitting: Cardiology

## 2024-03-13 VITALS — BP 95/62 | HR 65 | Wt 166.1 lb

## 2024-03-13 DIAGNOSIS — R0683 Snoring: Secondary | ICD-10-CM | POA: Insufficient documentation

## 2024-03-13 DIAGNOSIS — R5383 Other fatigue: Secondary | ICD-10-CM | POA: Insufficient documentation

## 2024-03-13 DIAGNOSIS — F17210 Nicotine dependence, cigarettes, uncomplicated: Secondary | ICD-10-CM | POA: Insufficient documentation

## 2024-03-13 DIAGNOSIS — I2510 Atherosclerotic heart disease of native coronary artery without angina pectoris: Secondary | ICD-10-CM | POA: Insufficient documentation

## 2024-03-13 DIAGNOSIS — E785 Hyperlipidemia, unspecified: Secondary | ICD-10-CM | POA: Insufficient documentation

## 2024-03-13 DIAGNOSIS — I110 Hypertensive heart disease with heart failure: Secondary | ICD-10-CM | POA: Insufficient documentation

## 2024-03-13 DIAGNOSIS — R0602 Shortness of breath: Secondary | ICD-10-CM | POA: Insufficient documentation

## 2024-03-13 DIAGNOSIS — Z720 Tobacco use: Secondary | ICD-10-CM

## 2024-03-13 DIAGNOSIS — Z7982 Long term (current) use of aspirin: Secondary | ICD-10-CM | POA: Insufficient documentation

## 2024-03-13 DIAGNOSIS — E782 Mixed hyperlipidemia: Secondary | ICD-10-CM

## 2024-03-13 DIAGNOSIS — Z79899 Other long term (current) drug therapy: Secondary | ICD-10-CM | POA: Insufficient documentation

## 2024-03-13 DIAGNOSIS — I5020 Unspecified systolic (congestive) heart failure: Secondary | ICD-10-CM | POA: Insufficient documentation

## 2024-03-13 DIAGNOSIS — Z7984 Long term (current) use of oral hypoglycemic drugs: Secondary | ICD-10-CM | POA: Insufficient documentation

## 2024-03-13 DIAGNOSIS — Z7902 Long term (current) use of antithrombotics/antiplatelets: Secondary | ICD-10-CM | POA: Insufficient documentation

## 2024-03-13 DIAGNOSIS — I255 Ischemic cardiomyopathy: Secondary | ICD-10-CM | POA: Insufficient documentation

## 2024-03-13 DIAGNOSIS — Z955 Presence of coronary angioplasty implant and graft: Secondary | ICD-10-CM | POA: Insufficient documentation

## 2024-03-13 MED ORDER — EZETIMIBE 10 MG PO TABS
10.0000 mg | ORAL_TABLET | Freq: Every day | ORAL | 11 refills | Status: AC
  Filled 2024-03-13: qty 30, 30d supply, fill #0
  Filled 2024-04-20: qty 30, 30d supply, fill #1
  Filled 2024-05-27: qty 30, 30d supply, fill #2
  Filled 2024-07-03 (×2): qty 30, 30d supply, fill #3
  Filled 2024-07-30: qty 30, 30d supply, fill #4
  Filled 2024-08-28 (×2): qty 30, 30d supply, fill #5
  Filled 2024-10-02: qty 30, 30d supply, fill #6

## 2024-03-13 MED ORDER — ATORVASTATIN CALCIUM 80 MG PO TABS
80.0000 mg | ORAL_TABLET | Freq: Every day | ORAL | 11 refills | Status: AC
  Filled 2024-03-13: qty 30, 30d supply, fill #0
  Filled 2024-04-20: qty 30, 30d supply, fill #1
  Filled 2024-05-27: qty 30, 30d supply, fill #2
  Filled 2024-07-03 (×2): qty 30, 30d supply, fill #3
  Filled 2024-07-30: qty 30, 30d supply, fill #4
  Filled 2024-08-29: qty 30, 30d supply, fill #5
  Filled 2024-10-02 – 2024-10-03 (×2): qty 30, 30d supply, fill #6

## 2024-03-13 NOTE — Progress Notes (Signed)
 ADVANCED HEART FAILURE CLINIC NOTE  Referring Physician: No ref. provider found  Primary Care: Pcp, No Primary Cardiologist: Dr. Wonda HF: Dr. Gardenia  CC: Heart failure with reduced EF  HPI: Kenneth Medina. is a 53 y.o. male with CAD (CTO of the LAD, PCI to RCA), HFrEF, HTN, tobacco use presenting today to establish care.   He reports that his symptoms started on 08/19/23. 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 Patient came to the [facility] ER where he was told that everything was okay. He continued working but had severe dyspnea/anxiety/panic attacks. He was then seen by Dr. Wonda outpatient, where EKG showed anterolateral Q waves. He was admitted for LHC; LVEDP was 43. Patient was diuresed and underwent staged intervention to the RCA. LAD w/ proximal CTO. TTE with severely reduced LVEF and akinesis of the mid to distal anterolateral LV.   He is here today for CHF follow-up. He states the heat has been very hard on him. Gets fatigued and short of breath easily when walking outside, able to do more in the air conditioning. 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 No orthopnea, PND or lower extremity edema. Reports his weight has been stable. Falls asleep easily during the day. His wife reports he snores at night. Taking all medications as prescribed.  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  Pertinent Family & social hx:  Continues to smoke 1/2 ppd. Takes a few drags from the cigarette then puts it out. Has cut back.   PHYSICAL EXAM: Vitals:   03/13/24 1353  BP: 95/62  Pulse: 65  SpO2: 99%   Weight 166 lb  General:  Well appearing. Neck: no JVD Cor: Regular rate & rhythm. No rubs, gallops or murmurs. Lungs: clear Abdomen: soft, nontender, nondistended.  Extremities: no edema Neuro: alert & orientedx3. Affect pleasant   DATA REVIEW  ECG: 10/19/23: NSR w/ anterolateral Q wave as per my  personal interpretation  ZIO:  06/25: SR, 4 runs NSVT with longest lasting 5 beats, rare PVCs and PACs  ECHO: 10/16/23: LVEF 25%-30%, akinesis of the entire anterior/anterolateral myocardium as per my personal interpretation  CATH: 10/15/23:    Prox LAD to Mid LAD lesion is 100% stenosed.   Prox RCA to Mid RCA lesion is 80% stenosed.   Prox LAD lesion is 30% stenosed. PCI to the RCA on 10/18/23     ASSESSMENT & PLAN:  Heart failure with reduced EF -Ischemic cardiomyopathy; CTO of the LAD with anterior Q waves -NYHA II/early III, worse in extreme heat. Volume looks good. Continue lasix  20 mg daily -Continue Losartan  12.5 mg daily. Titration limited by soft BP -Continue Toprol  XL 25 mg daily -Not on spiro d/t hyperkalemia. No BP room either. -Continue Jardiance  10 mg daily -Scr stable 0.97 in 5/25 -Scheduled for repeat echo next month. If EF < or = 35%, will refer to EP for consideration of primary prevention ICD. -May need advanced therapies in the future. Discussed smoking cessation.  2. CAD - Followed by Dr. Wonda; CTO of the LAD after MI in 12/24.  - PCI to the RCA in 2/25 by Dr. Anner - Doing very well symptomatically - Continue DAPT, ASA+Plavix  - Continue lipitor  80mg  daily  3. Tobacco use - Discussed importance of tobacco cessation. He has cut back.  4. Hyperlipidemia -On Atorvastatin  + Zetia , refilled both medications today -Goal LDL < 55, last LDL was  53  5. Fatigue -Suspect d/t CHF +/- OSA. 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  -Sleep study once he has insurance  Follow-up: 2 months with Dr. Gardenia [name], PA-C

## 2024-03-13 NOTE — Patient Instructions (Addendum)
 Follow-Up in: 2 months with Dr. Gardenia.  At the [facility], you and your health needs are our priority. We have a designated team specialized in the treatment of Heart Failure. This Care Team includes your primary Heart Failure Specialized Cardiologist (physician), Advanced Practice Providers (APPs- Physician Assistants and Nurse Practitioners), and Pharmacist who all work together to provide you with the care you need, when you need it.   You may see any of the following providers on your designated Care Team at your next follow up:  Dr. Toribio Fuel Dr. Ezra Shuck Dr. Ria Gardenia Dr. Odis Brownie Ellouise Class, FNP Jaun Bash, RPH-CPP  Please be sure to bring in all your medications bottles to every appointment.   Need to Contact Us :  If you have any questions or concerns before your next appointment please send us  a message through [place] or call our office at [phone].    TO LEAVE A MESSAGE FOR THE NURSE SELECT OPTION 2, PLEASE LEAVE A MESSAGE INCLUDING: YOUR NAME DATE OF BIRTH CALL BACK NUMBER REASON FOR CALL**this is important as we prioritize the call backs  YOU WILL RECEIVE A CALL BACK THE SAME DAY AS LONG AS YOU CALL BEFORE 4:00 PM

## 2024-03-24 ENCOUNTER — Ambulatory Visit (HOSPITAL_COMMUNITY): Payer: Self-pay | Admitting: Cardiology

## 2024-03-24 ENCOUNTER — Encounter: Payer: Self-pay | Admitting: Cardiology

## 2024-03-24 ENCOUNTER — Ambulatory Visit
Admission: RE | Admit: 2024-03-24 | Discharge: 2024-03-24 | Disposition: A | Discharge status: Home / Self Care | Payer: Self-pay | Source: Ambulatory Visit | Attending: Cardiology | Admitting: Cardiology

## 2024-03-24 DIAGNOSIS — I255 Ischemic cardiomyopathy: Secondary | ICD-10-CM | POA: Diagnosis not present

## 2024-03-24 DIAGNOSIS — I340 Nonrheumatic mitral (valve) insufficiency: Secondary | ICD-10-CM | POA: Insufficient documentation

## 2024-03-24 DIAGNOSIS — I2510 Atherosclerotic heart disease of native coronary artery without angina pectoris: Secondary | ICD-10-CM | POA: Diagnosis not present

## 2024-03-24 DIAGNOSIS — I252 Old myocardial infarction: Secondary | ICD-10-CM | POA: Insufficient documentation

## 2024-03-24 DIAGNOSIS — I5022 Chronic systolic (congestive) heart failure: Secondary | ICD-10-CM

## 2024-03-24 LAB — ECHOCARDIOGRAM COMPLETE
AR max vel: 3.73 cm2
AV Area VTI: 3.23 cm2
AV Area mean vel: 3.77 cm2
AV Mean grad: 3 mmHg
AV Peak grad: 4.7 mmHg
Ao pk vel: 1.08 m/s
Area-P 1/2: 2.43 cm2
Calc EF: 21.6 %
MV VTI: 2.4 cm2
S' Lateral: 4.9 cm
Single Plane A2C EF: 28.1 %
Single Plane A4C EF: 9.8 %

## 2024-03-24 MED ORDER — PERFLUTREN LIPID MICROSPHERE
1.0000 mL | INTRAVENOUS | Status: AC | PRN
  Administered 2024-03-24: 2 mL via INTRAVENOUS

## 2024-03-27 NOTE — Telephone Encounter (Signed)
 Called patient per Dr. Gardenia with following echo results:  Refer to EP for ICD.  Pt verbalized understanding and agreement to same. He and Dr. Gardenia had discussed this possibility at last clinic visit.

## 2024-04-09 ENCOUNTER — Other Ambulatory Visit: Payer: Self-pay

## 2024-04-10 ENCOUNTER — Other Ambulatory Visit: Payer: Self-pay

## 2024-04-14 ENCOUNTER — Other Ambulatory Visit: Payer: Self-pay

## 2024-04-15 ENCOUNTER — Other Ambulatory Visit: Payer: Self-pay

## 2024-04-15 ENCOUNTER — Encounter (HOSPITAL_COMMUNITY): Payer: Self-pay

## 2024-04-15 ENCOUNTER — Observation Stay (HOSPITAL_COMMUNITY)
Admission: EM | Admit: 2024-04-15 | Discharge: 2024-04-16 | Disposition: A | Discharge status: Home / Self Care | Attending: Internal Medicine | Admitting: Internal Medicine

## 2024-04-15 ENCOUNTER — Emergency Department (HOSPITAL_COMMUNITY)

## 2024-04-15 DIAGNOSIS — F17220 Nicotine dependence, chewing tobacco, uncomplicated: Secondary | ICD-10-CM | POA: Insufficient documentation

## 2024-04-15 DIAGNOSIS — I25119 Atherosclerotic heart disease of native coronary artery with unspecified angina pectoris: Secondary | ICD-10-CM | POA: Diagnosis present

## 2024-04-15 DIAGNOSIS — R079 Chest pain, unspecified: Principal | ICD-10-CM

## 2024-04-15 DIAGNOSIS — Z7982 Long term (current) use of aspirin: Secondary | ICD-10-CM | POA: Diagnosis not present

## 2024-04-15 DIAGNOSIS — I255 Ischemic cardiomyopathy: Secondary | ICD-10-CM

## 2024-04-15 DIAGNOSIS — I110 Hypertensive heart disease with heart failure: Secondary | ICD-10-CM | POA: Insufficient documentation

## 2024-04-15 DIAGNOSIS — I5022 Chronic systolic (congestive) heart failure: Secondary | ICD-10-CM | POA: Insufficient documentation

## 2024-04-15 DIAGNOSIS — I2510 Atherosclerotic heart disease of native coronary artery without angina pectoris: Principal | ICD-10-CM | POA: Insufficient documentation

## 2024-04-15 DIAGNOSIS — Z720 Tobacco use: Secondary | ICD-10-CM | POA: Diagnosis present

## 2024-04-15 DIAGNOSIS — I5020 Unspecified systolic (congestive) heart failure: Secondary | ICD-10-CM

## 2024-04-15 LAB — CREATININE, SERUM
Creatinine, Ser: 0.74 mg/dL (ref 0.61–1.24)
GFR, Estimated: >60 mL/min (ref >60)

## 2024-04-15 LAB — BRAIN NATRIURETIC PEPTIDE: B Natriuretic Peptide: 175.6 pg/mL — ABNORMAL HIGH (ref 0.0–100.0)

## 2024-04-15 LAB — COMPREHENSIVE METABOLIC PANEL WITH GFR
ALT: 19 U/L (ref 0–44)
AST: 19 U/L (ref 15–41)
Albumin: 3.4 g/dL — ABNORMAL LOW (ref 3.5–5.0)
Alkaline Phosphatase: 116 U/L (ref 38–126)
Anion gap: 8 (ref 5–15)
BUN: 13 mg/dL (ref 6–20)
CO2: 25 mmol/L (ref 22–32)
Calcium: 8.5 mg/dL — ABNORMAL LOW (ref 8.9–10.3)
Chloride: 105 mmol/L (ref 98–111)
Creatinine, Ser: 0.86 mg/dL (ref 0.61–1.24)
GFR, Estimated: >60 mL/min (ref >60)
Glucose, Bld: 90 mg/dL (ref 70–99)
Potassium: 4 mmol/L (ref 3.5–5.1)
Sodium: 138 mmol/L (ref 135–145)
Total Bilirubin: 0.6 mg/dL (ref 0.0–1.2)
Total Protein: 6.8 g/dL (ref 6.5–8.1)

## 2024-04-15 LAB — TROPONIN I (HIGH SENSITIVITY)
Troponin I (High Sensitivity): 22 ng/L — ABNORMAL HIGH (ref <18)
Troponin I (High Sensitivity): 24 ng/L — ABNORMAL HIGH (ref <18)

## 2024-04-15 LAB — CBC WITH DIFFERENTIAL/PLATELET
Abs Immature Granulocytes: 0.02 K/uL (ref 0.00–0.07)
Basophils Absolute: 0.1 K/uL (ref 0.0–0.1)
Basophils Relative: 1 %
Eosinophils Absolute: 0.2 K/uL (ref 0.0–0.5)
Eosinophils Relative: 3 %
HCT: 45.7 % (ref 39.0–52.0)
Hemoglobin: 15.3 g/dL (ref 13.0–17.0)
Immature Granulocytes: 0 %
Lymphocytes Relative: 29 %
Lymphs Abs: 2.5 K/uL (ref 0.7–4.0)
MCH: 30.1 pg (ref 26.0–34.0)
MCHC: 33.5 g/dL (ref 30.0–36.0)
MCV: 90 fL (ref 80.0–100.0)
Monocytes Absolute: 1 K/uL (ref 0.1–1.0)
Monocytes Relative: 11 %
Neutro Abs: 4.9 K/uL (ref 1.7–7.7)
Neutrophils Relative %: 56 %
Platelets: 221 K/uL (ref 150–400)
RBC: 5.08 MIL/uL (ref 4.22–5.81)
RDW: 14 % (ref 11.5–15.5)
WBC: 8.6 K/uL (ref 4.0–10.5)
nRBC: 0 % (ref 0.0–0.2)

## 2024-04-15 LAB — CBC
HCT: 45.3 % (ref 39.0–52.0)
Hemoglobin: 15.1 g/dL (ref 13.0–17.0)
MCH: 29.4 pg (ref 26.0–34.0)
MCHC: 33.3 g/dL (ref 30.0–36.0)
MCV: 88.1 fL (ref 80.0–100.0)
Platelets: 215 K/uL (ref 150–400)
RBC: 5.14 MIL/uL (ref 4.22–5.81)
RDW: 14 % (ref 11.5–15.5)
WBC: 9.6 K/uL (ref 4.0–10.5)
nRBC: 0 % (ref 0.0–0.2)

## 2024-04-15 MED ORDER — EMPAGLIFLOZIN 10 MG PO TABS
10.0000 mg | ORAL_TABLET | Freq: Every day | ORAL | Status: DC
  Administered 2024-04-15: 10 mg via ORAL
  Filled 2024-04-15: qty 1

## 2024-04-15 MED ORDER — ASPIRIN 325 MG PO TABS
325.0000 mg | ORAL_TABLET | Freq: Once | ORAL | Status: AC
  Administered 2024-04-15: 325 mg via ORAL
  Filled 2024-04-15: qty 1

## 2024-04-15 MED ORDER — LOSARTAN POTASSIUM 25 MG PO TABS
12.5000 mg | ORAL_TABLET | Freq: Every day | ORAL | Status: DC
  Administered 2024-04-15: 12.5 mg via ORAL
  Filled 2024-04-15: qty 1

## 2024-04-15 MED ORDER — EZETIMIBE 10 MG PO TABS
10.0000 mg | ORAL_TABLET | Freq: Every day | ORAL | Status: DC
  Administered 2024-04-15: 10 mg via ORAL
  Filled 2024-04-15: qty 1

## 2024-04-15 MED ORDER — ATORVASTATIN CALCIUM 80 MG PO TABS
80.0000 mg | ORAL_TABLET | Freq: Every day | ORAL | Status: DC
  Administered 2024-04-15: 80 mg via ORAL
  Filled 2024-04-15: qty 1

## 2024-04-15 MED ORDER — CLOPIDOGREL BISULFATE 75 MG PO TABS
75.0000 mg | ORAL_TABLET | Freq: Every day | ORAL | Status: DC
  Administered 2024-04-15: 75 mg via ORAL
  Filled 2024-04-15: qty 1

## 2024-04-15 MED ORDER — METOPROLOL SUCCINATE ER 25 MG PO TB24
25.0000 mg | ORAL_TABLET | Freq: Every day | ORAL | Status: DC
  Administered 2024-04-15: 25 mg via ORAL
  Filled 2024-04-15: qty 1

## 2024-04-15 MED ORDER — NITROGLYCERIN 0.4 MG SL SUBL: 0.4000 mg | SUBLINGUAL_TABLET | SUBLINGUAL | Status: DC | PRN

## 2024-04-15 MED ORDER — ENOXAPARIN SODIUM 40 MG/0.4ML IJ SOSY
40.0000 mg | PREFILLED_SYRINGE | INTRAMUSCULAR | Status: DC
  Filled 2024-04-15: qty 0.4

## 2024-04-15 MED ORDER — ASPIRIN 81 MG PO CHEW: 81.0000 mg | CHEWABLE_TABLET | Freq: Every day | ORAL | Status: DC

## 2024-04-15 NOTE — Consult Note (Signed)
  Kenneth Medina. is a 53 y.o. male with CAD (CTO of the LAD, PCI to RCA), HFrEF, HTN, tobacco use presenting today to establish care.    He reports that his symptoms started on 08/19/23. 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 Patient came to the [facility] ER where he was told that everything was okay. He continued working but had severe dyspnea/anxiety/panic attacks. He was then seen by Dr. Wonda outpatient, where EKG showed anterolateral Q waves. He was admitted for LHC; LVEDP was 43. Patient was diuresed and underwent staged intervention to the RCA. LAD w/ proximal CTO. TTE with severely reduced LVEF and akinesis of the mid to distal anterolateral LV.    He is here today for CHF follow-up. He states the heat has been very hard on him. Gets fatigued and short of breath easily when walking outside, able to do more in the air conditioning. 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 No orthopnea, PND or lower extremity edema. Reports his weight has been stable. Falls asleep easily during the day. His wife reports he snores at night. Taking all medications as prescribed.   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   Pertinent Family & social hx:  Continues to smoke 1/2 ppd. Takes a few drags from the cigarette then puts it out. Has cut back.   EKG 04/15/24: NSR, non-specific intraventricular conduction delay (QRS 134)   TTE 03/24/24  1. Global LV hypokinesis with mid-apical akinesis.. Left ventricular ejection fraction, by estimation, is 20 to 25%. Left ventricular ejection fraction by 2D MOD biplane is 21.6 %. The left ventricle has severely decreased function. The left ventricle demonstrates global hypokinesis. The left ventricular internal cavity size was mildly dilated. Left ventricular diastolic parameters are consistent with Grade I diastolic dysfunction (impaired relaxation).   2. Right ventricular systolic function is  normal. The right ventricular size is normal.   3. The mitral valve is normal in structure. Mild mitral valve regurgitation.   4. The aortic valve is tricuspid. Aortic valve regurgitation is not visualized.    Heart failure with reduced EF -Ischemic cardiomyopathy; CTO of the LAD with anterior Q waves -NYHA II/early III, worse in extreme heat. Volume looks good. Continue lasix  20 mg daily -Continue Losartan  12.5 mg daily. Titration limited by soft BP -Continue Toprol  XL 25 mg daily -Not on spiro d/t hyperkalemia. No BP room either. -Continue Jardiance  10 mg daily -Scr stable 0.97 in 5/25 -Scheduled for repeat echo next month. If EF < or = 35%, will refer to EP for consideration of primary prevention ICD. -May need advanced therapies in the future. Discussed smoking cessation.   2. CAD - Followed by Dr. Wonda; CTO of the LAD after MI in 12/24.  - PCI to the RCA in 2/25 by Dr. Anner - Doing very well symptomatically - Continue DAPT, ASA+Plavix  - Continue lipitor  80mg  daily   3. Tobacco use - Discussed importance of tobacco cessation. He has cut back.   4. Hyperlipidemia -On Atorvastatin  + Zetia , refilled both medications today -Goal LDL < 55, last LDL was 53   5. Fatigue -Suspect d/t CHF +/- OSA. 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  -Sleep study once he has insurance

## 2024-04-15 NOTE — H&P (Signed)
 History and Physical    Kenneth Medina. FMW:[id number] DOB: [birth year] DOA: 04/15/2024  Patient coming from: Home.  Chief Complaint: Chest pain.  HPI: Kenneth Medina. is a 53 y.o. male with history of CAD (CTO of the LAD, PCI to RCA), chronic HFrEF last EF measured on March 24, 2024 was 20 to 25% with grade 1 diastolic dysfunction, ongoing tobacco abuse presents to the ER with complaint of chest pain.  Patient states he was walking in the [place] when he suddenly started developing left-sided chest pain just below his nipple sharp occurring multiple times.  Patient took sublingual nitroglycerin  but pain did not get better.  So he came to the ER.  Denies any associated shortness of breath productive cough abdominal pain nausea vomiting.  Patient was admitted in February of this year and had underwent cardiac care and was status post proximal RCA PCI.  Per patient cardiology is planning for possible ICD placement in the next few weeks.   ED Course: In the ER patient chest pain eased off.  EKG shows changes comparable to the one in February 2025.  Troponins were 22 and 24.  BNP 175.  Chest x-ray shows some congestion.  Hemoglobin 15.3 creatinine 0.8.  AST ALT and alk phos was normal.  Cardiology on-call was consulted patient is being admitted for further management.  Review of Systems: As per HPI, rest all negative.   Past Medical History:  Diagnosis Date   Coronary artery disease    a. s/p ant STEMI 5/15 >> LHC (5/15):  prox D1 30-40%, LAD occluded, prox PDA 20-30%, mid to dist Ant HK, EF 45% >>> PCI:  2.75x22 mm Resolute DES to mid LAD   Hyperlipidemia    Hypertension    Ischemic cardiomyopathy    a. EF 45% at [facility] at time of MI >> b. Echo (5/15):  Apical HK, no clot, EF 50%, mod LVH, normal RVF   MI (myocardial infarction) (HCC)    2015   Tobacco abuse     Past Surgical History:  Procedure Laterality Date   CORONARY STENT INTERVENTION N/A 10/18/2023   Procedure: CORONARY  STENT INTERVENTION;  Surgeon: Anner Alm ORN, MD;  Location: [facility] INVASIVE CV LAB;  Service: Cardiovascular;  Laterality: N/A;   LEFT HEART CATH AND CORONARY ANGIOGRAPHY N/A 10/15/2023   Procedure: LEFT HEART CATH AND CORONARY ANGIOGRAPHY;  Surgeon: Wendel Lurena POUR, MD;  Location: [facility] INVASIVE CV LAB;  Service: Cardiovascular;  Laterality: N/A;   LEFT HEART CATHETERIZATION WITH CORONARY ANGIOGRAM N/A 01/23/2014   Procedure: LEFT HEART CATHETERIZATION WITH CORONARY ANGIOGRAM;  Surgeon: Ozell JONETTA Fell, MD;  Location: [facility] CATH LAB;  Service: Cardiovascular;  Laterality: N/A;     reports that he has been smoking cigarettes. He has a 15 pack-year smoking history. He has never used smokeless tobacco. He reports that he does not drink alcohol and does not use drugs.  Allergies  Allergen Reactions   Ibuprofen Hives    Family History  Problem Relation Age of Onset   Hypertension Mother    Cancer Maternal Grandmother    Cancer Maternal Grandfather    Heart attack Neg Hx    Stroke Neg Hx     Prior to Admission medications   Medication Sig Start Date End Date Taking? Authorizing Provider  aspirin  81 MG chewable tablet Chew 1 tablet (81 mg total) by mouth daily. 01/25/14  Yes Marcine Catalan M, PA-C  atorvastatin  (LIPITOR ) 80 MG tablet Take 1 tablet (80 mg total)  by mouth daily at 6 PM. 03/13/24 03/08/25 Yes Colletta Manuelita Garre, PA-C  clopidogrel  (PLAVIX ) 75 MG tablet Take 1 tablet (75 mg total) by mouth daily. 10/20/23 07/16/24 Yes Henry Manuelita NOVAK, NP  empagliflozin  (JARDIANCE ) 10 MG TABS tablet Take 1 tablet (10 mg total) by mouth daily. 10/20/23  Yes Adams, Zane, PA-C  ezetimibe  (ZETIA ) 10 MG tablet Take 1 tablet (10 mg total) by mouth daily. 03/13/24 03/13/25 Yes Colletta Manuelita Garre, PA-C  furosemide  (LASIX ) 20 MG tablet Take 1 tablet (20 mg total) by mouth daily. Please take an additional 20 MG as needed for weight gain or swelling Patient taking differently: Take 20 mg by mouth daily. 12/28/23  12/27/24 Yes Hackney, Ellouise A, FNP  losartan  (COZAAR ) 25 MG tablet Take 0.5 tablets (12.5 mg total) by mouth daily. 10/26/23  Yes Donette Ellouise A, FNP  metoprolol  succinate (TOPROL -XL) 25 MG 24 hr tablet Take 1 tablet (25 mg total) by mouth daily. 02/01/24  Yes Sabharwal, Aditya, DO  nitroGLYCERIN  (NITROSTAT ) 0.4 MG SL tablet Place 1 tablet (0.4 mg total) under the tongue every 5 (five) minutes x 3 doses as needed for chest pain. 10/14/23  Yes Wonda Sharper, MD    Physical Exam: Constitutional: Moderately built and nourished. Vitals:   04/15/24 2000 04/15/24 2030 04/15/24 2100 04/15/24 2138  BP: 96/65 (!) 93/59 (!) 100/57 (!) 99/59  Pulse: (!) 56 (!) 58 (!) 55 (!) 59  Resp: 14 17 18 18   Temp:    98.1 F (36.7 C)  TempSrc:    Oral  SpO2: 100% 100% 100% 97%  Weight:    74.7 kg  Height:    5' 9 (1.753 m)   Eyes: Anicteric no pallor. ENMT: No discharge from the ears eyes nose and mouth: Neck: No mass felt.  No neck rigidity. Respiratory: No rhonchi or crepitations. Cardiovascular: S1-S2 heard. Abdomen: Soft nontender bowel sound present. Musculoskeletal: No edema. Skin: No rash. Neurologic: Alert awake oriented to time place and person.  Moves all extremities. Psychiatric: Appears normal.  Normal affect.   Labs on Admission: I have personally reviewed following labs and imaging studies  CBC: Recent Labs  Lab 04/15/24 1739  WBC 8.6  NEUTROABS 4.9  HGB 15.3  HCT 45.7  MCV 90.0  PLT 221   Basic Metabolic Panel: Recent Labs  Lab 04/15/24 1739  NA 138  K 4.0  CL 105  CO2 25  GLUCOSE 90  BUN 13  CREATININE 0.86  CALCIUM  8.5*   GFR: Estimated Creatinine Clearance: 98.2 mL/min (by C-G formula based on SCr of 0.86 mg/dL). Liver Function Tests: Recent Labs  Lab 04/15/24 1739  AST 19  ALT 19  ALKPHOS 116  BILITOT 0.6  PROT 6.8  ALBUMIN 3.4*   No results for input(s): LIPASE, AMYLASE in the last 168 hours. No results for input(s): AMMONIA in the last 168  hours. Coagulation Profile: No results for input(s): INR, PROTIME in the last 168 hours. Cardiac Enzymes: No results for input(s): CKTOTAL, CKMB, CKMBINDEX, TROPONINI in the last 168 hours. BNP (last 3 results) No results for input(s): PROBNP in the last 8760 hours. HbA1C: No results for input(s): HGBA1C in the last 72 hours. CBG: No results for input(s): GLUCAP in the last 168 hours. Lipid Profile: No results for input(s): CHOL, HDL, LDLCALC, TRIG, CHOLHDL, LDLDIRECT in the last 72 hours. Thyroid  Function Tests: No results for input(s): TSH, T4TOTAL, FREET4, T3FREE, THYROIDAB in the last 72 hours. Anemia Panel: No results for input(s): VITAMINB12, FOLATE, FERRITIN,  TIBC, IRON, RETICCTPCT in the last 72 hours. Urine analysis:    Component Value Date/Time   COLORURINE YELLOW (A) 11/17/2016 1745   APPEARANCEUR CLEAR (A) 11/17/2016 1745   LABSPEC 1.009 11/17/2016 1745   PHURINE 8.0 11/17/2016 1745   GLUCOSEU NEGATIVE 11/17/2016 1745   HGBUR NEGATIVE 11/17/2016 1745   BILIRUBINUR NEGATIVE 11/17/2016 1745   KETONESUR NEGATIVE 11/17/2016 1745   PROTEINUR NEGATIVE 11/17/2016 1745   NITRITE NEGATIVE 11/17/2016 1745   LEUKOCYTESUR NEGATIVE 11/17/2016 1745   Sepsis Labs: @LABRCNTIP (procalcitonin:4,lacticidven:4) )No results found for this or any previous visit (from the past 240 hours).   Radiological Exams on Admission: DG Chest Port 1 View Result Date: 04/15/2024 CLINICAL DATA:  Chest pain.  History of CHF. EXAM: PORTABLE CHEST 1 VIEW COMPARISON:  08/18/2023. FINDINGS: The heart is enlarged the mediastinal contour stable. The pulmonary vasculature is prominent. No consolidation, effusion, or pneumothorax is seen. No acute osseous abnormality. IMPRESSION: Cardiomegaly with mild pulmonary vascular congestion. Electronically Signed   By: Leita Birmingham M.D.   On: 04/15/2024 18:09    EKG: Independently reviewed.  Normal sinus rhythm.   ST-T changes comparable to the EKG in February 2025.  Assessment/Plan Principal Problem:   Chest pain Active Problems:   Coronary artery disease involving native coronary artery of native heart with angina pectoris (HCC)   Tobacco use   Cardiomyopathy, ischemic    Chest pain with history of CAD status post PCI recently has PCI to RCA in February 2025.  Presently chest pain-free.  Cardiology has been consulted.  Patient is on aspirin  Plavix  statin Zetia  and metoprolol .  Will follow further recommendations from cardiology. Chronic HFrEF last EF measured July 2020 following was 20 to 25%.  Patient is on ARB Toprol -XL and Jardiance  and Lasix .  Chest x-ray shows congestion but patient appears compensated.  Will await further recommendation from cardiology.  Patient runs low normal blood pressure. Tobacco abuse advised about quitting.   Since patient has chest pain with history of CAD status post PCI and chronic HFrEF will need close monitoring further workup and more than 2 midnight stay.   DVT prophylaxis: Lovenox . Code Status: Full code. Family Communication: Discussed with patient. Disposition Plan: Cardiac telemetry. Consults called: Cardiology. Admission status: Observation.

## 2024-04-15 NOTE — ED Provider Notes (Signed)
 [place] EMERGENCY DEPARTMENT AT [facility] Provider Note   CSN: [id number] Arrival date & time: 04/15/24  1728     Patient presents with: Chest Pain   Ozell Dade Dmitriy Gair. is a 53 y.o. male history of previous MI status post stents, heart failure with a EF of 20%, here presenting with chest pain.  He states that he was walking and had sudden onset of left-sided chest pain.  He states that he stopped and drove over here.  He took 1 nitro but still has about 5 out of 10 pressure.  Patient states that he had a heart attack back in December and went to [facility] and left without being seen and then saw cardiology and developed heart failure and had 1 stent placed at that time.  Patient states that he is scheduled for an AICD due to heart failure.   The history is provided by the patient.       Prior to Admission medications   Medication Sig Start Date End Date Taking? Authorizing Provider  aspirin  81 MG chewable tablet Chew 1 tablet (81 mg total) by mouth daily. 01/25/14  Yes Marcine Catalan M, PA-C  atorvastatin  (LIPITOR ) 80 MG tablet Take 1 tablet (80 mg total) by mouth daily at 6 PM. 03/13/24 03/08/25 Yes Colletta Manuelita Garre, PA-C  clopidogrel  (PLAVIX ) 75 MG tablet Take 1 tablet (75 mg total) by mouth daily. 10/20/23 07/16/24 Yes Henry Manuelita NOVAK, NP  empagliflozin  (JARDIANCE ) 10 MG TABS tablet Take 1 tablet (10 mg total) by mouth daily. 10/20/23  Yes Adams, Zane, PA-C  ezetimibe  (ZETIA ) 10 MG tablet Take 1 tablet (10 mg total) by mouth daily. 03/13/24 03/13/25 Yes Colletta Manuelita Garre, PA-C  furosemide  (LASIX ) 20 MG tablet Take 1 tablet (20 mg total) by mouth daily. Please take an additional 20 MG as needed for weight gain or swelling Patient taking differently: Take 20 mg by mouth daily. 12/28/23 12/27/24 Yes Hackney, Ellouise A, FNP  losartan  (COZAAR ) 25 MG tablet Take 0.5 tablets (12.5 mg total) by mouth daily. 10/26/23  Yes Donette Ellouise LABOR, FNP  metoprolol  succinate (TOPROL -XL) 25  MG 24 hr tablet Take 1 tablet (25 mg total) by mouth daily. 02/01/24  Yes Sabharwal, Aditya, DO  nitroGLYCERIN  (NITROSTAT ) 0.4 MG SL tablet Place 1 tablet (0.4 mg total) under the tongue every 5 (five) minutes x 3 doses as needed for chest pain. 10/14/23  Yes Wonda Ozell, MD    Allergies: Ibuprofen    Review of Systems  Cardiovascular:  Positive for chest pain.  All other systems reviewed and are negative.   Updated Vital Signs BP 100/71   Pulse (!) 57   Temp 98.1 F (36.7 C) (Oral)   Resp 18   Ht 5' 9 (1.753 m)   Wt 74.7 kg   SpO2 97%   BMI 24.31 kg/m   Physical Exam Vitals and nursing note reviewed.  Constitutional:      Comments: Chronically ill-appearing  HENT:     Head: Normocephalic.  Eyes:     Extraocular Movements: Extraocular movements intact.     Pupils: Pupils are equal, round, and reactive to light.  Cardiovascular:     Rate and Rhythm: Normal rate and regular rhythm.     Heart sounds: Normal heart sounds.  Pulmonary:     Effort: Pulmonary effort is normal.     Breath sounds: Normal breath sounds.  Abdominal:     General: Bowel sounds are normal.     Palpations: Abdomen  is soft.  Musculoskeletal:     Cervical back: Normal range of motion and neck supple.     Comments: Trace edema bilaterally   Skin:    General: Skin is warm.     Capillary Refill: Capillary refill takes less than 2 seconds.  Neurological:     General: No focal deficit present.     Mental Status: He is oriented to person, place, and time.  Psychiatric:        Mood and Affect: Mood normal.        Behavior: Behavior normal.     (all labs ordered are listed, but only abnormal results are displayed) Labs Reviewed  COMPREHENSIVE METABOLIC PANEL WITH GFR - Abnormal; Notable for the following components:      Result Value   Calcium  8.5 (*)    Albumin 3.4 (*)    All other components within normal limits  BRAIN NATRIURETIC PEPTIDE - Abnormal; Notable for the following components:    B Natriuretic Peptide 175.6 (*)    All other components within normal limits  TROPONIN I (HIGH SENSITIVITY) - Abnormal; Notable for the following components:   Troponin I (High Sensitivity) 22 (*)    All other components within normal limits  TROPONIN I (HIGH SENSITIVITY) - Abnormal; Notable for the following components:   Troponin I (High Sensitivity) 24 (*)    All other components within normal limits  CBC WITH DIFFERENTIAL/PLATELET  CBC  CREATININE, SERUM  HIV ANTIBODY (ROUTINE TESTING W REFLEX)  TSH  BASIC METABOLIC PANEL WITH GFR  CBC    EKG: EKG Interpretation Date/Time:  Saturday April 15 2024 17:36:32 EDT Ventricular Rate:  67 PR Interval:  138 QRS Duration:  134 QT Interval:  435 QTC Calculation: 460 R Axis:   -21  Text Interpretation: Sinus rhythm Probable left atrial enlargement Nonspecific intraventricular conduction delay Anterolateral infarct, age indeterminate subtle elevations V4, V5, V2 unchanged from previous. No STEMI per Dr. Verlin Confirmed by Patt Alm DEL [phone]) on 04/15/2024 5:42:38 PM  Radiology: [facility] Chest Port 1 View Result Date: 04/15/2024 CLINICAL DATA:  Chest pain.  History of CHF. EXAM: PORTABLE CHEST 1 VIEW COMPARISON:  08/18/2023. FINDINGS: The heart is enlarged the mediastinal contour stable. The pulmonary vasculature is prominent. No consolidation, effusion, or pneumothorax is seen. No acute osseous abnormality. IMPRESSION: Cardiomegaly with mild pulmonary vascular congestion. Electronically Signed   By: Leita Birmingham M.D.   On: 04/15/2024 18:09     Procedures   Medications Ordered in the ED  aspirin  chewable tablet 81 mg (has no administration in time range)  atorvastatin  (LIPITOR ) tablet 80 mg (80 mg Oral Given 04/15/24 2257)  ezetimibe  (ZETIA ) tablet 10 mg (10 mg Oral Given 04/15/24 2256)  losartan  (COZAAR ) tablet 12.5 mg (12.5 mg Oral Given 04/15/24 2258)  metoprolol  succinate (TOPROL -XL) 24 hr tablet 25 mg (25 mg Oral Given 04/15/24 2257)   nitroGLYCERIN  (NITROSTAT ) SL tablet 0.4 mg (has no administration in time range)  empagliflozin  (JARDIANCE ) tablet 10 mg (10 mg Oral Given 04/15/24 2257)  clopidogrel  (PLAVIX ) tablet 75 mg (75 mg Oral Given 04/15/24 2258)  enoxaparin  (LOVENOX ) injection 40 mg (40 mg Subcutaneous Not Given 04/15/24 2302)  aspirin  tablet 325 mg (325 mg Oral Given 04/15/24 1750)                                    Medical Decision Making Ozell Dade Riki Gehring. is a 53 y.o.  male here presenting with chest pain with ambulation.  Patient has a history of CAD and also heart failure.  Patient appears euvolemic currently.  EKG showed some subtle elevations in V4 and V5.  I discussed with Dr. Verlin, STEMI doctor.  He agreed that the elevations were present in previous EKGs and wants to hold off on calling code STEMI for now.  Will get CBC and CMP and BNP and chest x-ray.  Patient took nitro already so will give aspirin .  8 pm  Initial troponin is 22.  However patient still has some chest pressure.  I have discussed case with cardiologist, Dr. Theoplis.  He will see patient as a consult.  Hospitalist to admit for rule out ACS  Amount and/or Complexity of Data Reviewed Labs: ordered. Radiology: ordered.  Risk OTC drugs. Decision regarding hospitalization.     Final diagnoses:  Chest pain, unspecified type    ED Discharge Orders     None          Patt Alm Macho, MD 04/15/24 2328

## 2024-04-15 NOTE — ED Triage Notes (Signed)
 Pt c.o chest pain that started about 1.5hrs ago while walking. Pt is supposed to get an ICD placed soon. Hx of CHF

## 2024-04-16 DIAGNOSIS — R072 Precordial pain: Secondary | ICD-10-CM | POA: Diagnosis not present

## 2024-04-16 DIAGNOSIS — Z720 Tobacco use: Secondary | ICD-10-CM | POA: Diagnosis not present

## 2024-04-16 DIAGNOSIS — I255 Ischemic cardiomyopathy: Secondary | ICD-10-CM | POA: Diagnosis not present

## 2024-04-16 DIAGNOSIS — I25119 Atherosclerotic heart disease of native coronary artery with unspecified angina pectoris: Secondary | ICD-10-CM

## 2024-04-16 LAB — CBC
HCT: 45.1 % (ref 39.0–52.0)
Hemoglobin: 15 g/dL (ref 13.0–17.0)
MCH: 29.5 pg (ref 26.0–34.0)
MCHC: 33.3 g/dL (ref 30.0–36.0)
MCV: 88.8 fL (ref 80.0–100.0)
Platelets: 212 K/uL (ref 150–400)
RBC: 5.08 MIL/uL (ref 4.22–5.81)
RDW: 14.1 % (ref 11.5–15.5)
WBC: 9.2 K/uL (ref 4.0–10.5)
nRBC: 0 % (ref 0.0–0.2)

## 2024-04-16 LAB — BASIC METABOLIC PANEL WITH GFR
Anion gap: 8 (ref 5–15)
BUN: 12 mg/dL (ref 6–20)
CO2: 22 mmol/L (ref 22–32)
Calcium: 8.6 mg/dL — ABNORMAL LOW (ref 8.9–10.3)
Chloride: 106 mmol/L (ref 98–111)
Creatinine, Ser: 0.82 mg/dL (ref 0.61–1.24)
GFR, Estimated: >60 mL/min (ref >60)
Glucose, Bld: 105 mg/dL — ABNORMAL HIGH (ref 70–99)
Potassium: 3.7 mmol/L (ref 3.5–5.1)
Sodium: 136 mmol/L (ref 135–145)

## 2024-04-16 LAB — TSH: TSH: 1.196 u[IU]/mL (ref 0.350–4.500)

## 2024-04-16 LAB — HIV ANTIBODY (ROUTINE TESTING W REFLEX): HIV Screen 4th Generation wRfx: NONREACTIVE

## 2024-04-16 MED ORDER — FUROSEMIDE 10 MG/ML IJ SOLN
40.0000 mg | Freq: Once | INTRAMUSCULAR | Status: AC
  Administered 2024-04-16: 40 mg via INTRAVENOUS
  Filled 2024-04-16: qty 4

## 2024-04-16 NOTE — Progress Notes (Signed)
   Progress Note  Patient Name: Kenneth Medina. Date of Encounter: 04/16/2024  Primary Cardiologist: Ozell Fell, MD  Patient is a 52 year old M known to have CAD s/p RCA PCI in February 2025 with residual LAD CTO, ischemic cardiomyopathy with LVEF 20 to 25% with no ICD, HTN presented to the ER with intermittent sharp chest pains x 2 hours when he was pushing the cart in [employer] yesterday.  Current chest pain not similar to index pain when he had MI.  Denied having any orthopnea, PND, DOE or any leg swelling.  EKG showed NSR, T wave inversions in anterolateral leads (old). Hs troponins mildly elevated, 22 and 24.  BNP 175.  Chest x-ray showed mild pulmonary vascular congestion.  Assessment and plan  Chest pain - Very atypical, resolved by SL NTG.  History not consistent with typical angina.  EKG showed NSR, no new ischemia and Hs troponins mildly elevated, 22>>24 (18 few months ago), flat, not ACS. - If this chest pains become frequent, can start him on antianginal therapy.  He will keep an eye on it.   Acute on chronic systolic and diastolic heart failure Ischemic cardiomyopathy with LVEF 20 to 25% with no device - Patient denied having any DOE, orthopnea, PND or leg swelling.  However his BNP is mildly elevated, 175 and  chest x-ray showed mild pulmonary vascular congestion.  Will administer 1 dose of IV Lasix  40 mg and starting tomorrow, he can resume his diuretic.  Otherwise no changes to his GDMT. - He reported that he is scheduled for ICD soon.   CAD s/p RCA PCI in 10/2023 with residual LAD CTO - Current chest pain is not similar to index with admission. - Continue DAPT.   Disposition: Discharge to home today after IV Lasix  1 dose.  Signed, Diannah SHAUNNA Maywood, MD  04/16/2024, 1:19 PM

## 2024-04-16 NOTE — Plan of Care (Signed)
  Problem: Clinical Measurements: Goal: Ability to maintain clinical measurements within normal limits will improve Outcome: Progressing   Problem: Clinical Measurements: Goal: Will remain free from infection Outcome: Progressing   Problem: Activity: Goal: Risk for activity intolerance will decrease Outcome: Progressing   

## 2024-04-16 NOTE — Discharge Summary (Signed)
 Physician Discharge Summary  Kenneth Medina. FMW:[id number] DOB: 11/30/1970 DOA: 04/15/2024  PCP: Patient, No Pcp Per  Admit date: 04/15/2024 Discharge date: 04/16/2024  Admitted From: Home Disposition: Home  Recommendations for Outpatient Follow-up:  Follow up with PCP in 1-2 weeks Please obtain BMP/CBC in one week Cardiology to schedule follow-up   Discharge Condition: Stable CODE STATUS: Full code Diet recommendation: Low-salt diet  Discharge summary: 53 year old with history of coronary artery disease, recent PCI to RCA 10/2023, ischemic cardiomyopathy with known ejection fraction 20 to 25%, smoker and hypertension started having sharp left-sided chest pain while pushing a cart in [employer], intermittent, coming every 5 to 10 minutes for 2 hours and completely improved by the time he came to the emergency room.  In the emergency room troponins 22-24.  BNP 175.  Chest x-ray with mild congestion.  EKG nonischemic and comparable to previous EKG.  Admitted for observation.  Chest pain-free.  Seen by cardiology.  Acute coronary syndrome ruled out.  He has well-established outpatient cardiology follow-up.  Patient will be going home and continuing his dual antiplatelet therapy and statins.   Discharge Diagnoses:  Principal Problem:   Chest pain Active Problems:   Coronary artery disease involving native coronary artery of native heart with angina pectoris (HCC)   Tobacco use   Cardiomyopathy, ischemic    Discharge Instructions  Discharge Instructions     Diet - low sodium heart healthy   Complete by: As directed    Increase activity slowly   Complete by: As directed       Allergies as of 04/16/2024       Reactions   Ibuprofen Hives        Medication List     TAKE these medications    aspirin  81 MG chewable tablet Chew 1 tablet (81 mg total) by mouth daily.   atorvastatin  80 MG tablet Commonly known as: LIPITOR  Take 1 tablet (80 mg total) by mouth daily at 6  PM.   clopidogrel  75 MG tablet Commonly known as: Plavix  Take 1 tablet (75 mg total) by mouth daily.   ezetimibe  10 MG tablet Commonly known as: ZETIA  Take 1 tablet (10 mg total) by mouth daily.   furosemide  20 MG tablet Commonly known as: Lasix  Take 1 tablet (20 mg total) by mouth daily. Please take an additional 20 MG as needed for weight gain or swelling What changed: additional instructions   Jardiance  10 MG Tabs tablet Generic drug: empagliflozin  Take 1 tablet (10 mg total) by mouth daily.   losartan  25 MG tablet Commonly known as: COZAAR  Take 0.5 tablets (12.5 mg total) by mouth daily.   metoprolol  succinate 25 MG 24 hr tablet Commonly known as: TOPROL -XL Take 1 tablet (25 mg total) by mouth daily.   nitroGLYCERIN  0.4 MG SL tablet Commonly known as: NITROSTAT  Place 1 tablet (0.4 mg total) under the tongue every 5 (five) minutes x 3 doses as needed for chest pain.        Allergies  Allergen Reactions   Ibuprofen Hives    Consultations: Cardiology   Procedures/Studies: DG Chest Port 1 View Result Date: 04/15/2024 CLINICAL DATA:  Chest pain.  History of CHF. EXAM: PORTABLE CHEST 1 VIEW COMPARISON:  08/18/2023. FINDINGS: The heart is enlarged the mediastinal contour stable. The pulmonary vasculature is prominent. No consolidation, effusion, or pneumothorax is seen. No acute osseous abnormality. IMPRESSION: Cardiomegaly with mild pulmonary vascular congestion. Electronically Signed   By: Leita Birmingham M.D.   On: 04/15/2024  18:09   ECHOCARDIOGRAM COMPLETE Result Date: 03/24/2024    ECHOCARDIOGRAM REPORT   Patient Name:   Kenneth Medina. Date of Exam: 03/24/2024 Medical Rec #:  [id number]              Height:       69.0 in Accession #:    [id number]             Weight:       166.1 lb Date of Birth:  October 20, 1970               BSA:          1.909 m Patient Age:    52 years               BP:           95/62 mmHg Patient Gender: M                      HR:           65  bpm. Exam Location:  [facility] Procedure: 2D Echo, Cardiac Doppler, Color Doppler and Intracardiac            Opacification Agent (Both Spectral and Color Flow Doppler were            utilized during procedure). Indications:     Congestive Heart Failure I50.9  History:         Patient has prior history of Echocardiogram examinations, most                  recent 10/16/2023. Previous Myocardial Infarction and CAD.  Sonographer:     Ashley McNeely-Sloane Referring Phys:  [id number] [name] Diagnosing Phys: Redell Cave MD IMPRESSIONS  1. Global LV hypokinesis with mid-apical akinesis.. Left ventricular ejection fraction, by estimation, is 20 to 25%. Left ventricular ejection fraction by 2D MOD biplane is 21.6 %. The left ventricle has severely decreased function. The left ventricle demonstrates global hypokinesis. The left ventricular internal cavity size was mildly dilated. Left ventricular diastolic parameters are consistent with Grade I diastolic dysfunction (impaired relaxation).  2. Right ventricular systolic function is normal. The right ventricular size is normal.  3. The mitral valve is normal in structure. Mild mitral valve regurgitation.  4. The aortic valve is tricuspid. Aortic valve regurgitation is not visualized. FINDINGS  Left Ventricle: Global LV hypokinesis with mid-apical akinesis. Left ventricular ejection fraction, by estimation, is 20 to 25%. Left ventricular ejection fraction by 2D MOD biplane is 21.6 %. The left ventricle has severely decreased function. The left  ventricle demonstrates global hypokinesis. Definity  contrast agent was given IV to delineate the left ventricular endocardial borders. The left ventricular internal cavity size was mildly dilated. There is no left ventricular hypertrophy. Left ventricular diastolic parameters are consistent with Grade I diastolic dysfunction (impaired relaxation). Right Ventricle: The right ventricular size is normal. No increase in right  ventricular wall thickness. Right ventricular systolic function is normal. Left Atrium: Left atrial size was normal in size. Right Atrium: Right atrial size was normal in size. Pericardium: There is no evidence of pericardial effusion. Mitral Valve: The mitral valve is normal in structure. Mild mitral valve regurgitation. MV peak gradient, 3.2 mmHg. The mean mitral valve gradient is 2.0 mmHg. Tricuspid Valve: The tricuspid valve is not well visualized. Tricuspid valve regurgitation is not demonstrated. Aortic Valve: The aortic valve is tricuspid. Aortic valve regurgitation is not visualized. Aortic valve mean gradient measures 3.0  mmHg. Aortic valve peak gradient measures 4.7 mmHg. Aortic valve area, by VTI measures 3.23 cm. Pulmonic Valve: The pulmonic valve was not well visualized. Pulmonic valve regurgitation is not visualized. Aorta: The aortic root and ascending aorta are structurally normal, with no evidence of dilitation. IAS/Shunts: No atrial level shunt detected by color flow Doppler.  LEFT VENTRICLE PLAX 2D                        Biplane EF (MOD) LVIDd:         6.10 cm         LV Biplane EF:   Left LVIDs:         4.90 cm                          ventricular LV PW:         1.10 cm                          ejection LV IVS:        1.00 cm                          fraction by LVOT diam:     2.30 cm                          2D MOD LV SV:         76                               biplane is LV SV Index:   40                               21.6 %. LVOT Area:     4.15 cm                                Diastology                                LV e' medial:    6.12 cm/s LV Volumes (MOD)               LV E/e' medial:  11.2 LV vol d, MOD    217.0 ml      LV e' lateral:   5.75 cm/s A2C:                           LV E/e' lateral: 11.9 LV vol d, MOD    256.0 ml A4C: LV vol s, MOD    156.0 ml A2C: LV vol s, MOD    231.0 ml A4C: LV SV MOD A2C:   61.0 ml LV SV MOD A4C:   256.0 ml LV SV MOD BP:    52.2 ml RIGHT VENTRICLE              IVC RV S prime:     10.70 cm/s  IVC diam: 2.20 cm TAPSE (M-mode): 2.0 cm LEFT ATRIUM             Index  RIGHT ATRIUM           Index LA diam:        3.90 cm 2.04 cm/m   RA Area:     15.30 cm LA Vol (A2C):   42.4 ml 22.21 ml/m  RA Volume:   38.70 ml  20.27 ml/m LA Vol (A4C):   49.3 ml 25.82 ml/m LA Biplane Vol: 47.5 ml 24.88 ml/m  AORTIC VALVE                    PULMONIC VALVE AV Area (Vmax):    3.73 cm     PV Vmax:        0.73 m/s AV Area (Vmean):   3.77 cm     PV Vmean:       48.800 cm/s AV Area (VTI):     3.23 cm     PV VTI:         0.167 m AV Vmax:           108.00 cm/s  PV Peak grad:   2.1 mmHg AV Vmean:          73.400 cm/s  PV Mean grad:   1.0 mmHg AV VTI:            0.234 m      RVOT Peak grad: 1 mmHg AV Peak Grad:      4.7 mmHg AV Mean Grad:      3.0 mmHg LVOT Vmax:         97.00 cm/s LVOT Vmean:        66.600 cm/s LVOT VTI:          0.182 m LVOT/AV VTI ratio: 0.78  AORTA Ao Root diam: 3.50 cm Ao Asc diam:  3.10 cm MITRAL VALVE MV Area (PHT): 2.43 cm    SHUNTS MV Area VTI:   2.40 cm    Systemic VTI:  0.18 m MV Peak grad:  3.2 mmHg    Systemic Diam: 2.30 cm MV Mean grad:  2.0 mmHg    Pulmonic VTI:  0.113 m MV Vmax:       0.90 m/s MV Vmean:      66.4 cm/s MV Decel Time: 312 msec MV E velocity: 68.70 cm/s MV A velocity: 93.00 cm/s MV E/A ratio:  0.74 Redell Cave MD Electronically signed by Redell Cave MD Signature Date/Time: 03/24/2024/1:38:25 PM    Final    (Echo, Carotid, EGD, Colonoscopy, ERCP)    Subjective: Patient seen and examined.  No overnight events.  On my morning rounds, he would like to go home.  Denies any chest pain since admission.   Discharge Exam: Vitals:   04/16/24 0410 04/16/24 0810  BP: (!) 101/59 (!) 99/58  Pulse: 81 61  Resp: 18 18  Temp: 97.8 F (36.6 C) 97.9 F (36.6 C)  SpO2: 95% 95%   Vitals:   04/15/24 2300 04/16/24 0410 04/16/24 0600 04/16/24 0810  BP: 100/71 (!) 101/59  (!) 99/58  Pulse: (!) 58 81  61  Resp: 18 18  18    Temp: 97.8 F (36.6 C) 97.8 F (36.6 C)  97.9 F (36.6 C)  TempSrc: Oral Oral  Oral  SpO2: 92% 95%  95%  Weight:   74.6 kg   Height:        General: Pt is alert, awake, not in acute distress Cardiovascular: RRR, S1/S2 +, no rubs, no gallops Respiratory: CTA bilaterally, no wheezing, no rhonchi, no palpable chest pain. Abdominal: Soft, NT,  ND, bowel sounds + Extremities: no edema, no cyanosis    The results of significant diagnostics from this hospitalization (including imaging, microbiology, ancillary and laboratory) are listed below for reference.     Microbiology: No results found for this or any previous visit (from the past 240 hours).   Labs: BNP (last 3 results) Recent Labs    07/08/23 1720 02/01/24 1125 04/15/24 1739  BNP 65.1 293.3* 175.6*   Basic Metabolic Panel: Recent Labs  Lab 04/15/24 1739 04/15/24 2255 04/16/24 0228  NA 138  --  136  K 4.0  --  3.7  CL 105  --  106  CO2 25  --  22  GLUCOSE 90  --  105*  BUN 13  --  12  CREATININE 0.86 0.74 0.82  CALCIUM  8.5*  --  8.6*   Liver Function Tests: Recent Labs  Lab 04/15/24 1739  AST 19  ALT 19  ALKPHOS 116  BILITOT 0.6  PROT 6.8  ALBUMIN 3.4*   No results for input(s): LIPASE, AMYLASE in the last 168 hours. No results for input(s): AMMONIA in the last 168 hours. CBC: Recent Labs  Lab 04/15/24 1739 04/15/24 2255 04/16/24 0228  WBC 8.6 9.6 9.2  NEUTROABS 4.9  --   --   HGB 15.3 15.1 15.0  HCT 45.7 45.3 45.1  MCV 90.0 88.1 88.8  PLT 221 215 212   Cardiac Enzymes: No results for input(s): CKTOTAL, CKMB, CKMBINDEX, TROPONINI in the last 168 hours. BNP: Invalid input(s): POCBNP CBG: No results for input(s): GLUCAP in the last 168 hours. D-Dimer No results for input(s): DDIMER in the last 72 hours. Hgb A1c No results for input(s): HGBA1C in the last 72 hours. Lipid Profile No results for input(s): CHOL, HDL, LDLCALC, TRIG, CHOLHDL, LDLDIRECT in  the last 72 hours. Thyroid  function studies Recent Labs    04/16/24 0228  TSH 1.196   Anemia work up No results for input(s): VITAMINB12, FOLATE, FERRITIN, TIBC, IRON, RETICCTPCT in the last 72 hours. Urinalysis    Component Value Date/Time   COLORURINE YELLOW (A) 11/17/2016 1745   APPEARANCEUR CLEAR (A) 11/17/2016 1745   LABSPEC 1.009 11/17/2016 1745   PHURINE 8.0 11/17/2016 1745   GLUCOSEU NEGATIVE 11/17/2016 1745   HGBUR NEGATIVE 11/17/2016 1745   BILIRUBINUR NEGATIVE 11/17/2016 1745   KETONESUR NEGATIVE 11/17/2016 1745   PROTEINUR NEGATIVE 11/17/2016 1745   NITRITE NEGATIVE 11/17/2016 1745   LEUKOCYTESUR NEGATIVE 11/17/2016 1745   Sepsis Labs Recent Labs  Lab 04/15/24 1739 04/15/24 2255 04/16/24 0228  WBC 8.6 9.6 9.2   Microbiology No results found for this or any previous visit (from the past 240 hours).   Time coordinating discharge: 28 minutes  SIGNED:   Renato Applebaum, MD  [facility] 04/16/2024, 1:19 PM

## 2024-04-20 ENCOUNTER — Other Ambulatory Visit: Payer: Self-pay

## 2024-04-21 ENCOUNTER — Other Ambulatory Visit: Payer: Self-pay

## 2024-04-23 NOTE — Progress Notes (Unsigned)
 Electrophysiology Office Note:   Date:  04/24/2024  ID:  [name], DOB Feb 04, 1971, MRN [id number]  Primary Cardiologist: Ozell Fell, MD Electrophysiologist: [name], MD      History of Present Illness:   Kenneth Medina. is a 53 y.o. male with h/o CAD (CTO of the LAD, PCI to RCA), chronic systolic heart failure, HTN, tobacco use who is being seen today for evaluation for ICD implant at the request of Dr. Gardenia.  Discussed the use of AI scribe software for clinical note transcription with the patient, who gave verbal consent to proceed.  History of Present Illness Kenneth Medina. is a 53 year old male with reduced ejection fraction who presents for evaluation of defibrillator placement.   He has a history of myocardial infarction and subsequent stent placement. Despite being on medications, his heart's pumping function remains below 35%. He understands the defibrillator as an insurance policy to protect against life-threatening heart rhythms, especially when alone or in situations where immediate help may not be available. He is very concerned about his long term risk, as is his wife who has accompanied him today.  He inquires about the sensation of the defibrillator shock, having read that it feels like a [place] kicking you. He is concerned about the restrictions post-procedure, particularly regarding driving and lifting. 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   No new or acute complaints today.  Review of systems complete and found to be negative unless listed in HPI.   EP Information / Studies Reviewed:    EKG is not ordered today. EKG from 04/15/24 reviewed which showed SR with PR and QRS .      Echo 03/2024:   1. Global LV hypokinesis with mid-apical akinesis.. Left ventricular  ejection fraction, by estimation, is 20 to 25%. Left ventricular ejection  fraction by 2D MOD biplane is 21.6 %. The left  ventricle has severely  decreased function. The left ventricle  demonstrates global hypokinesis. The left ventricular internal cavity size  was mildly dilated. Left ventricular diastolic parameters are consistent  with Grade I diastolic dysfunction (impaired relaxation).   2. Right ventricular systolic function is normal. The right ventricular  size is normal.   3. The mitral valve is normal in structure. Mild mitral valve  regurgitation.   4. The aortic valve is tricuspid. Aortic valve regurgitation is not  visualized.    Physical Exam:   VS:  BP 118/70 (BP Location: Left Arm, Patient Position: Sitting, Cuff Size: Normal)   Pulse 61   Ht 5' 9 (1.753 m)   Wt 160 lb (72.6 kg)   SpO2 97%   BMI 23.63 kg/m    Wt Readings from Last 3 Encounters:  04/24/24 160 lb (72.6 kg)  04/16/24 164 lb 7.4 oz (74.6 kg)  03/13/24 166 lb 2 oz (75.4 kg)     GEN: Well nourished, well developed in no acute distress NECK: No JVD CARDIAC: Normal rate, regular rhythm. RESPIRATORY:  Clear to auscultation without rales, wheezing or rhonchi  ABDOMEN: Soft, non-distended EXTREMITIES:  No edema; No deformity   ASSESSMENT AND PLAN:    #Chronic systolic heart failure: NYHA II/III. #Ischemic cardiomyopathy - Patient meets criteria for primary prevention ICD given that LVEF remains below 35% despite optimal medical therapy and greater than 90 days from revascularization. Explained risks, benefits, and alternatives to ICD implantation, including but not limited to bleeding, infection, damage to heart or  lungs, heart attack, stroke, or death.  Pt verbalized understanding and wants to proceed. Will plan for [facility] [employer] VVI ICD. - Continue GDMT regimen of empagliflozin  10 mg daily, losartan  12.5 mg daily, metoprolol  XL 25 mg daily. - Close follow-up with HF clinic.  #CAD: CTO of LAD, s/p PCI to RCA #Hyperlipidemia: - Continue aspirin  81 mg daily, Plavix  75 mg daily, atorvastatin  80 mg daily.  Follow up  with Dr. Kennyth 3 months after implant.   Signed, [name], MD

## 2024-04-24 ENCOUNTER — Encounter: Payer: Self-pay | Admitting: Cardiology

## 2024-04-24 ENCOUNTER — Ambulatory Visit: Payer: Self-pay | Attending: Cardiology | Admitting: Cardiology

## 2024-04-24 ENCOUNTER — Other Ambulatory Visit: Payer: Self-pay

## 2024-04-24 VITALS — BP 118/70 | HR 61 | Ht 69.0 in | Wt 160.0 lb

## 2024-04-24 DIAGNOSIS — E785 Hyperlipidemia, unspecified: Secondary | ICD-10-CM | POA: Diagnosis not present

## 2024-04-24 DIAGNOSIS — I5022 Chronic systolic (congestive) heart failure: Secondary | ICD-10-CM

## 2024-04-24 DIAGNOSIS — I255 Ischemic cardiomyopathy: Secondary | ICD-10-CM | POA: Diagnosis not present

## 2024-04-24 DIAGNOSIS — I2510 Atherosclerotic heart disease of native coronary artery without angina pectoris: Secondary | ICD-10-CM

## 2024-04-24 NOTE — Patient Instructions (Signed)
 Medication Instructions:  Your physician recommends that you continue on your current medications as directed. Please refer to the Current Medication list given to you today.  *If you need a refill on your cardiac medications before your next appointment, please call your pharmacy*  Lab Work: BMET and CBC - please go to any [facility] location to have these drawn one week prior to your procedure.  Testing/Procedures: ICD Implant Your physician has recommended that you have a defibrillator inserted. An implantable cardioverter defibrillator (ICD) is a small device that is placed in your chest or, in rare cases, your abdomen. This device uses electrical pulses or shocks to help control life-threatening, irregular heartbeats that could lead the heart to suddenly stop beating (sudden cardiac arrest). Leads are attached to the ICD that goes into your heart. This is done in the hospital and usually requires an overnight stay. Please see the instruction sheet given to you today for more information.   Follow-Up: At [facility], you and your health needs are our priority.  As part of our continuing mission to provide you with exceptional heart care, our providers are all part of one team.  This team includes your primary Cardiologist (physician) and Advanced Practice Providers or APPs (Physician Assistants and Nurse Practitioners) who all work together to provide you with the care you need, when you need it.  Your next appointment:   We will contact you to schedule your post-procedure appointments

## 2024-04-27 ENCOUNTER — Other Ambulatory Visit: Payer: Self-pay

## 2024-05-05 ENCOUNTER — Other Ambulatory Visit: Payer: Self-pay

## 2024-05-08 ENCOUNTER — Other Ambulatory Visit: Payer: Self-pay | Admitting: Family

## 2024-05-08 ENCOUNTER — Other Ambulatory Visit: Payer: Self-pay

## 2024-05-09 ENCOUNTER — Other Ambulatory Visit: Payer: Self-pay

## 2024-05-09 ENCOUNTER — Other Ambulatory Visit: Payer: Self-pay | Admitting: Family

## 2024-05-10 ENCOUNTER — Other Ambulatory Visit: Payer: Self-pay

## 2024-05-10 MED ORDER — LOSARTAN POTASSIUM 25 MG PO TABS
12.5000 mg | ORAL_TABLET | Freq: Every day | ORAL | 3 refills | Status: AC
  Filled 2024-05-10: qty 15, 30d supply, fill #0
  Filled 2024-06-01: qty 15, 30d supply, fill #1
  Filled 2024-07-03 (×2): qty 15, 30d supply, fill #2
  Filled 2024-07-30: qty 15, 30d supply, fill #3
  Filled 2024-08-28 (×2): qty 15, 30d supply, fill #4
  Filled 2024-10-02: qty 15, 30d supply, fill #5

## 2024-05-22 ENCOUNTER — Other Ambulatory Visit: Payer: Self-pay

## 2024-05-22 ENCOUNTER — Other Ambulatory Visit
Admission: RE | Admit: 2024-05-22 | Discharge: 2024-05-22 | Disposition: A | Discharge status: Home / Self Care | Payer: Self-pay | Source: Ambulatory Visit | Attending: Cardiology | Admitting: Cardiology

## 2024-05-22 ENCOUNTER — Encounter: Payer: Self-pay | Admitting: Cardiology

## 2024-05-22 ENCOUNTER — Ambulatory Visit (HOSPITAL_BASED_OUTPATIENT_CLINIC_OR_DEPARTMENT_OTHER): Payer: Self-pay | Admitting: Cardiology

## 2024-05-22 ENCOUNTER — Other Ambulatory Visit: Admission: RE | Admit: 2024-05-22 | Source: Ambulatory Visit

## 2024-05-22 VITALS — BP 102/65 | HR 60 | Wt 169.0 lb

## 2024-05-22 DIAGNOSIS — I110 Hypertensive heart disease with heart failure: Secondary | ICD-10-CM | POA: Insufficient documentation

## 2024-05-22 DIAGNOSIS — I255 Ischemic cardiomyopathy: Secondary | ICD-10-CM | POA: Insufficient documentation

## 2024-05-22 DIAGNOSIS — F17210 Nicotine dependence, cigarettes, uncomplicated: Secondary | ICD-10-CM | POA: Insufficient documentation

## 2024-05-22 DIAGNOSIS — I5022 Chronic systolic (congestive) heart failure: Secondary | ICD-10-CM

## 2024-05-22 DIAGNOSIS — I2510 Atherosclerotic heart disease of native coronary artery without angina pectoris: Secondary | ICD-10-CM

## 2024-05-22 DIAGNOSIS — I5084 End stage heart failure: Secondary | ICD-10-CM

## 2024-05-22 DIAGNOSIS — Z720 Tobacco use: Secondary | ICD-10-CM

## 2024-05-22 LAB — BASIC METABOLIC PANEL WITH GFR
Anion gap: 10 (ref 5–15)
BUN: 15 mg/dL (ref 6–20)
CO2: 26 mmol/L (ref 22–32)
Calcium: 8.7 mg/dL — ABNORMAL LOW (ref 8.9–10.3)
Chloride: 103 mmol/L (ref 98–111)
Creatinine, Ser: 0.76 mg/dL (ref 0.61–1.24)
GFR, Estimated: >60 mL/min (ref >60)
Glucose, Bld: 99 mg/dL (ref 70–99)
Potassium: 4.4 mmol/L (ref 3.5–5.1)
Sodium: 139 mmol/L (ref 135–145)

## 2024-05-22 LAB — CBC
HCT: 44.5 % (ref 39.0–52.0)
Hemoglobin: 14.8 g/dL (ref 13.0–17.0)
MCH: 29.8 pg (ref 26.0–34.0)
MCHC: 33.3 g/dL (ref 30.0–36.0)
MCV: 89.5 fL (ref 80.0–100.0)
Platelets: 274 K/uL (ref 150–400)
RBC: 4.97 MIL/uL (ref 4.22–5.81)
RDW: 13.4 % (ref 11.5–15.5)
WBC: 8.8 K/uL (ref 4.0–10.5)
nRBC: 0 % (ref 0.0–0.2)

## 2024-05-22 LAB — BRAIN NATRIURETIC PEPTIDE: B Natriuretic Peptide: 171.9 pg/mL — ABNORMAL HIGH (ref 0.0–100.0)

## 2024-05-22 NOTE — Addendum Note (Signed)
 Addended by: SHARL GRATE A on: 05/22/2024 03:12 PM   Modules accepted: Orders

## 2024-05-22 NOTE — Patient Instructions (Signed)
 Medication Changes:  No medication changes today!  Lab Work:  Go over to the [facility]. Go pass the gift shop and have your blood work completed.  We will only call you if the results are abnormal or if the provider would like to make medication changes.  No news is good news.   Special Instructions // Education:   [facility] [address] [place] [address] Dept: [phone]  Maximilliano Kersh.  05/22/2024  You are scheduled for a Cardiac Catheterization on Wednesday, September 10 with Dr. Odis Brownie.  1. Please arrive at the [facility] (Main Entrance A) at [facility]: [address] [place], [place] [address] at 8:00 AM (This time is 2 hour(s) before your procedure to ensure your preparation).   Free valet parking service is available. You will check in at ADMITTING. The support person will be asked to wait in the waiting room.  It is OK to have someone drop you off and come back when you are ready to be discharged.    Special note: Every effort is made to have your procedure done on time. Please understand that emergencies sometimes delay scheduled procedures.  2. Diet: Nothing to eat after midnight.   3. Hydration: On September 10, you may drink approved liquids (see below) until 2 hours before the procedure with 8 oz of water  as your last intake.   List of approved liquids water , clear juice, clear tea, black coffee, fruit juices, non-citric and without pulp, carbonated beverages, Gatorade, Kool -Aid, plain Jello-O and plain ice popsicles.  4. Labs: You will need to have blood drawn on today. 5. Medication instructions in preparation for your procedure:  Do not take the morning of your procedure: Jardiance  and Furosemide .   Contrast Allergy: No    On the morning of your procedure, take your Aspirin  81 mg and any morning medicines NOT listed above.  You  may use sips of water .  6. Plan to go home the same day, you will only stay overnight if medically necessary. 7. Bring a current list of your medications and current insurance cards. 8. You MUST have a responsible person to drive you home. 9. Someone MUST be with you the first 24 hours after you arrive home or your discharge will be delayed. 10. Please wear clothes that are easy to get on and off and wear slip-on shoes.  Thank you for allowing us  to care for you!   -- [facility]   Follow-Up in: Please follow up with the [facility] in 1 month with Dr. Gardenia. We are currently working on that schedule. If you have not heard from us  by Oct. 1st, please give us  a call in order to schedule your appointment.    Thank you for choosing [facility].    At the [facility], you and your health needs are our priority. We have a designated team specialized in the treatment of Heart Failure. This Care Team includes your primary Heart Failure Specialized Cardiologist (physician), Advanced Practice Providers (APPs- Physician Assistants and Nurse Practitioners), and Pharmacist who all work together to provide you with the care you need, when you need it.   You may see any of the following providers on your designated Care Team at your next follow up:  Dr. Toribio Fuel Dr. Ezra Shuck Dr. Ria Gardenia  Dr. Morene Zenaida Ellouise Donette, FNP Jaun Bash, RPH-CPP  Please be sure to bring in all your medications bottles to every appointment.   Need to Contact Us :  If you have any questions or concerns before your next appointment please send us  a message through [place] or call our office at [phone].    TO LEAVE A MESSAGE FOR THE NURSE SELECT OPTION 2, PLEASE LEAVE A MESSAGE INCLUDING: YOUR NAME DATE OF BIRTH CALL BACK NUMBER REASON FOR CALL**this is important as we prioritize  the call backs  YOU WILL RECEIVE A CALL BACK THE SAME DAY AS LONG AS YOU CALL BEFORE 4:00 PM

## 2024-05-22 NOTE — Progress Notes (Signed)
 Orders placed for right heart cath.   No precert required

## 2024-05-22 NOTE — Progress Notes (Signed)
 ADVANCED HEART FAILURE CLINIC NOTE  Referring Physician: No ref. provider found  [facility]: [facility] Primary Cardiologist: Dr. Wonda HF: Dr. Gardenia  CC: Heart failure with reduced EF  HPI: Kenneth Medina. is a 53 y.o. male with CAD (CTO of the LAD, PCI to RCA), HFrEF, HTN, tobacco use presenting today for follow up. [name]   He reports that his symptoms of heart failure/dyspnea/chest pain started on 08/19/23. 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 Patient came to the [facility] ER where he was told that everything was okay. He continued working but had severe dyspnea/anxiety/panic attacks. He was then seen by Dr. Wonda outpatient, where EKG showed anterolateral Q waves. He was admitted for LHC; LVEDP was 43. Patient was diuresed and underwent staged intervention to the RCA. LAD w/ proximal CTO. TTE with severely reduced LVEF and akinesis of the mid to distal anterolateral LV.   Pertinent Family & social hx:  Continues to smoke however has cut back significantly  Interval hx:  - He has noticed worsening fatigue and dyspnea over the past few weeks. He notices that he becomes quickly fatigued and falling asleep throughout the day. Reports that he feels 'run down' a lot. Frequent episodes of dyspnea when going to sleep. Reports fatigue with ADLs.  - Compliant with all medications.  - Continues to smoke; however, planning to stop now.   PHYSICAL EXAM: Vitals:   05/22/24 1416  BP: 102/65  Pulse: 60  SpO2: 98%   GENERAL: NAD Lungs- CTA CARDIAC:  JVP: 7 cm          Normal rate with regular rhythm. no murmur.  Pulses 2+. trace edema.  ABDOMEN: Soft, non-tender, non-distended.  EXTREMITIES: Warm and well perfused.  NEUROLOGIC: No obvious FND  DATA REVIEW  ECG: 10/19/23: NSR w/ anterolateral Q wave as per my personal interpretation  ECHO: 10/16/23: LVEF 25%-30%, akinesis of the entire anterior/anterolateral myocardium as per  my personal interpretation  CATH: 10/15/23:    Prox LAD to Mid LAD lesion is 100% stenosed.   Prox RCA to Mid RCA lesion is 80% stenosed.   Prox LAD lesion is 30% stenosed. PCI to the RCA on 10/18/23   ASSESSMENT & PLAN:  Heart failure with reduced EF Etiology of YQ:Pdryzfpr cardiomyopathy; CTO of the LAD with anterior Q waves NYHA class / AHA Stage:NYHA IIB Volume status & Diuretics: Euvolemic on lasix  20mg  daily.  Vasodilators:Losartan  12.5mg  daily; limited by SBP Beta-Blocker: HR 60 today; continue toprol  25mg  daily. May need to decrease if he remains bradycardic.  FMJ:ynoipwh due to elevated K; will adjust meds s/p ICD implant.  Cardiometabolic:Jardiance  10mg  daily Devices therapies & Valvulopathies:Repeat TTE on 03/24/24 with LVEF 20-25%. ICD implant scheduled for 05/25/24.  Advanced therapies:During our last appointment, [name] was doing fairly well from a functional standpoint with NYHA IIB symptoms. Today he reports a decline; he is becoming fatigued with ADLs. His TTE from 03/24/24 has an adequate VTI not suggestive of low output heart failure; in addition, no pulses alternans. Regardless, given the degree of remodeling and scar, he will likely require advanced therapies in the future. He is not a candidate for transplant due to continued tobacco use. We discussed this extensively today. He plans to quit smoking now at which point we will start regular nicotine  tests. Otherwise, he has preserved RV function if LVAD therapy is indicated. I spoke with Dr. Kennyth today. We will plan to pursue RHC prior to  ICD implant on Thursday. CMP/BNP/CBC pending today.   2. CAD - Followed by Dr. Wonda; CTO of the LAD after MI in 12/24.  - PCI to the RCA in 2/25 by Dr. Anner - Continue DAPT, ASA+Plavix  - Continue lipitor  80mg  daily - Stable; no chest pain.   3. Tobacco use - Discussed importance of smoking cessation.   I spent 60 minutes caring for this patient today including face to face  time, ordering and reviewing labs, reviewing echocardiogram with him, discussing transplant vs LVAD, discussing smoking cessation, reaching out to EP as noted above, seeing the patient, documenting in the record, and arranging follow ups.   Delton Stelle Advanced Heart Failure Mechanical Circulatory Support

## 2024-05-22 NOTE — H&P (View-Only) (Signed)
 ADVANCED HEART FAILURE CLINIC NOTE  Referring Physician: No ref. provider found  [facility]: [facility] Primary Cardiologist: Dr. Wonda HF: Dr. Gardenia  CC: Heart failure with reduced EF  HPI: Kenneth Medina. is a 53 y.o. male with CAD (CTO of the LAD, PCI to RCA), HFrEF, HTN, tobacco use presenting today for follow up. [name]   He reports that his symptoms of heart failure/dyspnea/chest pain started on 08/19/23. 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 Patient came to the [facility] ER where he was told that everything was okay. He continued working but had severe dyspnea/anxiety/panic attacks. He was then seen by Dr. Wonda outpatient, where EKG showed anterolateral Q waves. He was admitted for LHC; LVEDP was 43. Patient was diuresed and underwent staged intervention to the RCA. LAD w/ proximal CTO. TTE with severely reduced LVEF and akinesis of the mid to distal anterolateral LV.   Pertinent Family & social hx:  Continues to smoke however has cut back significantly  Interval hx:  - He has noticed worsening fatigue and dyspnea over the past few weeks. He notices that he becomes quickly fatigued and falling asleep throughout the day. Reports that he feels 'run down' a lot. Frequent episodes of dyspnea when going to sleep. Reports fatigue with ADLs.  - Compliant with all medications.  - Continues to smoke; however, planning to stop now.   PHYSICAL EXAM: Vitals:   05/22/24 1416  BP: 102/65  Pulse: 60  SpO2: 98%   GENERAL: NAD Lungs- CTA CARDIAC:  JVP: 7 cm          Normal rate with regular rhythm. no murmur.  Pulses 2+. trace edema.  ABDOMEN: Soft, non-tender, non-distended.  EXTREMITIES: Warm and well perfused.  NEUROLOGIC: No obvious FND  DATA REVIEW  ECG: 10/19/23: NSR w/ anterolateral Q wave as per my personal interpretation  ECHO: 10/16/23: LVEF 25%-30%, akinesis of the entire anterior/anterolateral myocardium as per  my personal interpretation  CATH: 10/15/23:    Prox LAD to Mid LAD lesion is 100% stenosed.   Prox RCA to Mid RCA lesion is 80% stenosed.   Prox LAD lesion is 30% stenosed. PCI to the RCA on 10/18/23   ASSESSMENT & PLAN:  Heart failure with reduced EF Etiology of YQ:Pdryzfpr cardiomyopathy; CTO of the LAD with anterior Q waves NYHA class / AHA Stage:NYHA IIB Volume status & Diuretics: Euvolemic on lasix  20mg  daily.  Vasodilators:Losartan  12.5mg  daily; limited by SBP Beta-Blocker: HR 60 today; continue toprol  25mg  daily. May need to decrease if he remains bradycardic.  FMJ:ynoipwh due to elevated K; will adjust meds s/p ICD implant.  Cardiometabolic:Jardiance  10mg  daily Devices therapies & Valvulopathies:Repeat TTE on 03/24/24 with LVEF 20-25%. ICD implant scheduled for 05/25/24.  Advanced therapies:During our last appointment, [name] was doing fairly well from a functional standpoint with NYHA IIB symptoms. Today he reports a decline; he is becoming fatigued with ADLs. His TTE from 03/24/24 has an adequate VTI not suggestive of low output heart failure; in addition, no pulses alternans. Regardless, given the degree of remodeling and scar, he will likely require advanced therapies in the future. He is not a candidate for transplant due to continued tobacco use. We discussed this extensively today. He plans to quit smoking now at which point we will start regular nicotine  tests. Otherwise, he has preserved RV function if LVAD therapy is indicated. I spoke with Dr. Kennyth today. We will plan to pursue RHC prior to  ICD implant on Thursday. CMP/BNP/CBC pending today.   2. CAD - Followed by Dr. Wonda; CTO of the LAD after MI in 12/24.  - PCI to the RCA in 2/25 by Dr. Anner - Continue DAPT, ASA+Plavix  - Continue lipitor  80mg  daily - Stable; no chest pain.   3. Tobacco use - Discussed importance of smoking cessation.   I spent 60 minutes caring for this patient today including face to face  time, ordering and reviewing labs, reviewing echocardiogram with him, discussing transplant vs LVAD, discussing smoking cessation, reaching out to EP as noted above, seeing the patient, documenting in the record, and arranging follow ups.   Delton Stelle Advanced Heart Failure Mechanical Circulatory Support

## 2024-05-23 ENCOUNTER — Telehealth (HOSPITAL_COMMUNITY): Payer: Self-pay | Admitting: *Deleted

## 2024-05-23 NOTE — Telephone Encounter (Signed)
 There is no auth on file from [place] for [facility] on 9/10, per [facility] pt is working with [employer] to obtain auth but is having difficulty:  Our office has contacted the patient twice. We provided him with information on how to obtain a [place] authorization, and he informed us  that he's aware of the process and has been dealing with the [facility], stating that it has been difficult. He says he's going to the [facility] in person because he's not getting anywhere.   He is aware that he doesn't have authorization for tomorrow's procedure, nor the one the following day at this time. I am not sure if he went to the [facility] today.   AMANDA HEPLER   [facility] Patient Access Advocate II   Attempted to call pt to discuss further and left VM on ID VM that auth is not on file, if he wishes to proceed he will have to sign waiver to accept responsibility for cost, he can due this or call our office back to resch

## 2024-05-24 ENCOUNTER — Other Ambulatory Visit: Payer: Self-pay

## 2024-05-24 ENCOUNTER — Encounter (HOSPITAL_COMMUNITY): Admission: RE | Disposition: A | Discharge status: Home / Self Care | Source: Home / Self Care | Attending: Cardiology

## 2024-05-24 ENCOUNTER — Encounter (HOSPITAL_COMMUNITY): Payer: Self-pay | Admitting: Internal Medicine

## 2024-05-24 ENCOUNTER — Ambulatory Visit (HOSPITAL_COMMUNITY)
Admission: RE | Admit: 2024-05-24 | Discharge: 2024-05-24 | Disposition: A | Discharge status: Home / Self Care | Payer: Self-pay | Attending: Cardiology | Admitting: Cardiology

## 2024-05-24 DIAGNOSIS — I110 Hypertensive heart disease with heart failure: Secondary | ICD-10-CM | POA: Insufficient documentation

## 2024-05-24 DIAGNOSIS — I509 Heart failure, unspecified: Secondary | ICD-10-CM

## 2024-05-24 DIAGNOSIS — I255 Ischemic cardiomyopathy: Secondary | ICD-10-CM | POA: Insufficient documentation

## 2024-05-24 DIAGNOSIS — Z7982 Long term (current) use of aspirin: Secondary | ICD-10-CM | POA: Insufficient documentation

## 2024-05-24 DIAGNOSIS — I2510 Atherosclerotic heart disease of native coronary artery without angina pectoris: Secondary | ICD-10-CM | POA: Insufficient documentation

## 2024-05-24 DIAGNOSIS — I5022 Chronic systolic (congestive) heart failure: Secondary | ICD-10-CM | POA: Insufficient documentation

## 2024-05-24 DIAGNOSIS — F17200 Nicotine dependence, unspecified, uncomplicated: Secondary | ICD-10-CM | POA: Insufficient documentation

## 2024-05-24 DIAGNOSIS — I2582 Chronic total occlusion of coronary artery: Secondary | ICD-10-CM | POA: Insufficient documentation

## 2024-05-24 DIAGNOSIS — Z7902 Long term (current) use of antithrombotics/antiplatelets: Secondary | ICD-10-CM | POA: Insufficient documentation

## 2024-05-24 DIAGNOSIS — Z79899 Other long term (current) drug therapy: Secondary | ICD-10-CM | POA: Insufficient documentation

## 2024-05-24 DIAGNOSIS — Z955 Presence of coronary angioplasty implant and graft: Secondary | ICD-10-CM | POA: Insufficient documentation

## 2024-05-24 HISTORY — PX: RIGHT HEART CATH: CATH118263

## 2024-05-24 LAB — POCT I-STAT EG7
Acid-Base Excess: 1 mmol/L (ref 0.0–2.0)
Acid-Base Excess: 2 mmol/L (ref 0.0–2.0)
Bicarbonate: 25.7 mmol/L (ref 20.0–28.0)
Bicarbonate: 27.7 mmol/L (ref 20.0–28.0)
Calcium, Ion: 1.05 mmol/L — ABNORMAL LOW (ref 1.15–1.40)
Calcium, Ion: 1.16 mmol/L (ref 1.15–1.40)
HCT: 43 % (ref 39.0–52.0)
HCT: 46 % (ref 39.0–52.0)
Hemoglobin: 14.6 g/dL (ref 13.0–17.0)
Hemoglobin: 15.6 g/dL (ref 13.0–17.0)
O2 Saturation: 70 %
O2 Saturation: 71 %
Potassium: 3.9 mmol/L (ref 3.5–5.1)
Potassium: 4.2 mmol/L (ref 3.5–5.1)
Sodium: 141 mmol/L (ref 135–145)
Sodium: 143 mmol/L (ref 135–145)
TCO2: 27 mmol/L (ref 22–32)
TCO2: 29 mmol/L (ref 22–32)
pCO2, Ven: 41.3 mmHg — ABNORMAL LOW (ref 44–60)
pCO2, Ven: 44.1 mmHg (ref 44–60)
pH, Ven: 7.401 (ref 7.25–7.43)
pH, Ven: 7.406 (ref 7.25–7.43)
pO2, Ven: 36 mmHg (ref 32–45)
pO2, Ven: 37 mmHg (ref 32–45)

## 2024-05-24 SURGERY — RIGHT HEART CATH
Anesthesia: LOCAL

## 2024-05-24 MED ORDER — SODIUM CHLORIDE 0.9 % IV SOLN: 250.0000 mL | INTRAVENOUS | Status: DC | PRN

## 2024-05-24 MED ORDER — ASPIRIN 81 MG PO CHEW
81.0000 mg | CHEWABLE_TABLET | ORAL | Status: AC
  Administered 2024-05-24: 81 mg via ORAL
  Filled 2024-05-24: qty 1

## 2024-05-24 MED ORDER — ONDANSETRON HCL 4 MG/2ML IJ SOLN: 4.0000 mg | Freq: Four times a day (QID) | INTRAMUSCULAR | Status: DC | PRN

## 2024-05-24 MED ORDER — SODIUM CHLORIDE 0.9% FLUSH: 3.0000 mL | INTRAVENOUS | Status: DC | PRN

## 2024-05-24 MED ORDER — HYDRALAZINE HCL 20 MG/ML IJ SOLN: 10.0000 mg | INTRAMUSCULAR | Status: DC | PRN

## 2024-05-24 MED ORDER — LIDOCAINE HCL (PF) 1 % IJ SOLN
INTRAMUSCULAR | Status: DC | PRN
  Administered 2024-05-24: 2 mL

## 2024-05-24 MED ORDER — HEPARIN (PORCINE) IN NACL 1000-0.9 UT/500ML-% IV SOLN
INTRAVENOUS | Status: DC | PRN
  Administered 2024-05-24 (×2): 500 mL

## 2024-05-24 MED ORDER — FREE WATER: 250.0000 mL | Freq: Once | Status: DC

## 2024-05-24 MED ORDER — ACETAMINOPHEN 325 MG PO TABS: 650.0000 mg | ORAL_TABLET | ORAL | Status: DC | PRN

## 2024-05-24 MED ORDER — LABETALOL HCL 5 MG/ML IV SOLN: 10.0000 mg | INTRAVENOUS | Status: DC | PRN

## 2024-05-24 MED ORDER — SODIUM CHLORIDE 0.9% FLUSH: 3.0000 mL | Freq: Two times a day (BID) | INTRAVENOUS | Status: DC

## 2024-05-24 MED ORDER — SODIUM CHLORIDE 0.9% FLUSH
3.0000 mL | Freq: Two times a day (BID) | INTRAVENOUS | Status: DC
Start: 2024-05-24 — End: 2024-05-24

## 2024-05-24 MED ORDER — LIDOCAINE HCL (PF) 1 % IJ SOLN
INTRAMUSCULAR | Status: AC
  Filled 2024-05-24: qty 30

## 2024-05-24 SURGICAL SUPPLY — 5 items
CATH SWAN GANZ 7F STRAIGHT (CATHETERS) IMPLANT
GLIDESHEATH SLENDER 7FR .021G (SHEATH) IMPLANT
PACK CARDIAC CATHETERIZATION (CUSTOM PROCEDURE TRAY) ×1 IMPLANT
TRANSDUCER W/STOPCOCK (MISCELLANEOUS) IMPLANT
TUBING ART PRESS 72 MALE/FEM (TUBING) IMPLANT

## 2024-05-24 NOTE — Pre-Procedure Instructions (Signed)
 Instructed patient on the following items: Arrival time 1230 Nothing to eat or drink after midnight No meds AM of procedure Responsible person to drive you home and stay with you for 24 hrs Wash with special soap night before and morning of procedure

## 2024-05-24 NOTE — Interval H&P Note (Signed)
 History and Physical Interval Note:  05/24/2024 8:50 AM  Kenneth Medina.  has presented today for surgery, with the diagnosis of heart failure.  The various methods of treatment have been discussed with the patient and family. After consideration of risks, benefits and other options for treatment, the patient has consented to  Procedure(s): RIGHT HEART CATH (N/A) as a surgical intervention.  The patient's history has been reviewed, patient examined, no change in status, stable for surgery.  I have reviewed the patient's chart and labs.  Questions were answered to the patient's satisfaction.     Kenneth Medina

## 2024-05-24 NOTE — Discharge Instructions (Signed)

## 2024-05-25 ENCOUNTER — Ambulatory Visit (HOSPITAL_COMMUNITY)
Admission: RE | Admit: 2024-05-25 | Discharge: 2024-05-25 | Disposition: A | Discharge status: Home / Self Care | Payer: Self-pay | Attending: Cardiology | Admitting: Cardiology

## 2024-05-25 ENCOUNTER — Ambulatory Visit (HOSPITAL_COMMUNITY): Payer: Self-pay

## 2024-05-25 ENCOUNTER — Encounter (HOSPITAL_COMMUNITY)
Admission: RE | Disposition: A | Discharge status: Home / Self Care | Payer: Self-pay | Source: Home / Self Care | Attending: Cardiology

## 2024-05-25 DIAGNOSIS — I5022 Chronic systolic (congestive) heart failure: Secondary | ICD-10-CM

## 2024-05-25 DIAGNOSIS — Z7982 Long term (current) use of aspirin: Secondary | ICD-10-CM | POA: Insufficient documentation

## 2024-05-25 DIAGNOSIS — Z79899 Other long term (current) drug therapy: Secondary | ICD-10-CM | POA: Insufficient documentation

## 2024-05-25 DIAGNOSIS — Z7902 Long term (current) use of antithrombotics/antiplatelets: Secondary | ICD-10-CM | POA: Insufficient documentation

## 2024-05-25 DIAGNOSIS — I2510 Atherosclerotic heart disease of native coronary artery without angina pectoris: Secondary | ICD-10-CM | POA: Insufficient documentation

## 2024-05-25 DIAGNOSIS — I110 Hypertensive heart disease with heart failure: Secondary | ICD-10-CM | POA: Insufficient documentation

## 2024-05-25 DIAGNOSIS — E785 Hyperlipidemia, unspecified: Secondary | ICD-10-CM | POA: Insufficient documentation

## 2024-05-25 DIAGNOSIS — I255 Ischemic cardiomyopathy: Secondary | ICD-10-CM

## 2024-05-25 DIAGNOSIS — Z7984 Long term (current) use of oral hypoglycemic drugs: Secondary | ICD-10-CM | POA: Insufficient documentation

## 2024-05-25 DIAGNOSIS — Z955 Presence of coronary angioplasty implant and graft: Secondary | ICD-10-CM | POA: Insufficient documentation

## 2024-05-25 DIAGNOSIS — I252 Old myocardial infarction: Secondary | ICD-10-CM | POA: Insufficient documentation

## 2024-05-25 HISTORY — PX: ICD IMPLANT: EP1208

## 2024-05-25 SURGERY — ICD IMPLANT

## 2024-05-25 MED ORDER — LIDOCAINE HCL (PF) 1 % IJ SOLN
INTRAMUSCULAR | Status: AC
  Filled 2024-05-25: qty 60

## 2024-05-25 MED ORDER — SODIUM CHLORIDE 0.9 % IV SOLN
80.0000 mg | INTRAVENOUS | Status: AC
  Administered 2024-05-25: 80 mg

## 2024-05-25 MED ORDER — POVIDONE-IODINE 10 % EX SWAB: 2.0000 | Freq: Once | CUTANEOUS | Status: DC

## 2024-05-25 MED ORDER — FENTANYL CITRATE (PF) 100 MCG/2ML IJ SOLN
INTRAMUSCULAR | Status: DC | PRN
  Administered 2024-05-25: 25 ug via INTRAVENOUS
  Administered 2024-05-25: 50 ug via INTRAVENOUS

## 2024-05-25 MED ORDER — HEPARIN (PORCINE) IN NACL 1000-0.9 UT/500ML-% IV SOLN
INTRAVENOUS | Status: DC | PRN
  Administered 2024-05-25: 500 mL

## 2024-05-25 MED ORDER — CEFAZOLIN SODIUM-DEXTROSE 2-4 GM/100ML-% IV SOLN
INTRAVENOUS | Status: AC
  Filled 2024-05-25: qty 100

## 2024-05-25 MED ORDER — MIDAZOLAM HCL 5 MG/5ML IJ SOLN
INTRAMUSCULAR | Status: DC | PRN
  Administered 2024-05-25: .5 mg via INTRAVENOUS
  Administered 2024-05-25: 1 mg via INTRAVENOUS

## 2024-05-25 MED ORDER — CEFAZOLIN SODIUM-DEXTROSE 2-4 GM/100ML-% IV SOLN
2.0000 g | INTRAVENOUS | Status: AC
  Administered 2024-05-25: 2 g via INTRAVENOUS

## 2024-05-25 MED ORDER — MIDAZOLAM HCL 5 MG/5ML IJ SOLN
INTRAMUSCULAR | Status: AC
  Filled 2024-05-25: qty 5

## 2024-05-25 MED ORDER — ACETAMINOPHEN 325 MG PO TABS: 325.0000 mg | ORAL_TABLET | ORAL | Status: DC | PRN

## 2024-05-25 MED ORDER — SODIUM CHLORIDE 0.9 % IV SOLN: INTRAVENOUS | Status: DC

## 2024-05-25 MED ORDER — SODIUM CHLORIDE 0.9 % IV SOLN
INTRAVENOUS | Status: AC
  Filled 2024-05-25: qty 2

## 2024-05-25 MED ORDER — ONDANSETRON HCL 4 MG/2ML IJ SOLN: 4.0000 mg | Freq: Four times a day (QID) | INTRAMUSCULAR | Status: DC | PRN

## 2024-05-25 MED ORDER — FENTANYL CITRATE (PF) 100 MCG/2ML IJ SOLN
INTRAMUSCULAR | Status: AC
  Filled 2024-05-25: qty 2

## 2024-05-25 MED ORDER — LIDOCAINE HCL (PF) 1 % IJ SOLN
INTRAMUSCULAR | Status: DC | PRN
  Administered 2024-05-25: 30 mL

## 2024-05-25 MED ORDER — CHLORHEXIDINE GLUCONATE 4 % EX SOLN
4.0000 | Freq: Once | CUTANEOUS | Status: DC
  Filled 2024-05-25: qty 60

## 2024-05-25 SURGICAL SUPPLY — 10 items
CABLE SURGICAL S-101-97-12 (CABLE) ×1 IMPLANT
ICD VIGILANT DR D233 (Pacemaker) IMPLANT
LEAD INGEVITY 7841 52 (Lead) IMPLANT
LEAD RELIANCE 0672 IMPLANT
PAD DEFIB RADIO PHYSIO CONN (PAD) ×1 IMPLANT
SHEATH 7FR PRELUDE SNAP 13 (SHEATH) IMPLANT
SHEATH 8FR PRELUDE SNAP 13 (SHEATH) IMPLANT
SHEATH PROBE COVER 6X72 (BAG) IMPLANT
TRAY PACEMAKER INSERTION (PACKS) ×1 IMPLANT
WIRE MICRO SET SILHO 5FR 7 (SHEATH) IMPLANT

## 2024-05-25 NOTE — Progress Notes (Signed)
 Patient reports no changes in pre-producer charting from yesterday 05/24/2024.

## 2024-05-25 NOTE — Telephone Encounter (Signed)
 Community care referral faxed 05/22/24

## 2024-05-25 NOTE — Discharge Instructions (Signed)
 After Your ICD (Implantable Cardiac Defibrillator)   You have a [employer] ICD  If you have a Medtronic or Biotronik device, plug in your home monitor once you get home, and no manual interaction is required.   If you have an [employer] or [employer] device, plug your home monitor once you get home, sit near the device, and press the large activation button. Sit nearby until the process is complete, usually notated by lights on the monitor.   If you were set up for monitoring using an app on your phone, make sure the app remains open in the background and the Bluetooth remains on.  ACTIVITY Do not lift your arm above shoulder height for 1 week after your procedure. After 7 days, you may progress as below.  You should remove your sling 24 hours after your procedure, unless otherwise instructed by your provider.     Thursday June 01, 2024  Friday June 02, 2024 Saturday June 03, 2024 Sunday June 04, 2024   Do not lift, push, pull, or carry anything over 10 pounds with the affected arm until 6 weeks (Thursday July 06, 2024 ) after your procedure.   You may drive AFTER your wound check, unless you have been told otherwise by your provider.   Ask your healthcare provider when you can go back to work   INCISION/Dressing If you are on a blood thinner such as Coumadin, Xarelto, Eliquis, Plavix , or Pradaxa please confirm with your provider when this should be resumed. 05/25/2024  If large square, outer bandage is left in place, this can be removed after 24 hours from your procedure. Do not remove steri-strips or glue as below.   Monitor your defibrillator site for redness, swelling, and drainage. Call the device clinic at [phone] if you experience these symptoms or fever/chills.  If your incision is sealed with Steri-strips or staples, you may shower 7 days after your procedure or when told by your provider. Do not remove the steri-strips or let the shower  hit directly on your site. You may wash around your site with soap and water .    If you were discharged in a sling, please do not wear this during the day more than 48 hours after your surgery unless otherwise instructed. This may increase the risk of stiffness and soreness in your shoulder.   Avoid lotions, ointments, or perfumes over your incision until it is well-healed.  You may use a hot tub or a pool AFTER your wound check appointment if the incision is completely closed.  Your ICD is designed to protect you from life threatening heart rhythms. Because of this, you may receive a shock.   1 shock with no symptoms:  Call the office during business hours. 1 shock with symptoms (chest pain, chest pressure, dizziness, lightheadedness, shortness of breath, overall feeling unwell):  Call 911. If you experience 2 or more shocks in 24 hours:  Call 911. If you receive a shock, you should not drive for 6 months per the [place] DMV IF you receive appropriate therapy from your ICD.   ICD Alerts:  Some alerts are vibratory and others beep. These are NOT emergencies. Please call our office to let us  know. If this occurs at night or on weekends, it can wait until the next business day. Send a remote transmission.  If your device is capable of reading fluid status (for heart failure), you will be offered monthly monitoring to review this with you.   DEVICE MANAGEMENT Remote  monitoring is used to monitor your ICD from home. This monitoring is scheduled every 91 days by our office. It allows us  to keep an eye on the functioning of your device to ensure it is working properly. You will routinely see your Electrophysiologist annually (more often if necessary). This will appear as a REMOTE check on your MyChart schedule. These are automatic and there is nothing for you to manually do unless otherwise instructed.  You should receive your ID card for your new device in 4-8 weeks. Keep this card with you at all times  once received. Consider wearing a medical alert bracelet or necklace.  Your ICD  may be MRI compatible. This will be discussed at your next office visit/wound check.  You should avoid contact with strong electric or magnetic fields.   Do not use amateur (ham) radio equipment or electric (arc) welding torches. MP3 player headphones with magnets should not be used. Some devices are safe to use if held at least 12 inches (30 cm) from your defibrillator. These include power tools, lawn mowers, and speakers. If you are unsure if something is safe to use, ask your health care provider.  When using your cell phone, hold it to the ear that is on the opposite side from the defibrillator. Do not leave your cell phone in a pocket over the defibrillator.  You may safely use electric blankets, heating pads, computers, and microwave ovens.  Call the office right away if: You have chest pain. You feel more than one shock. You feel more short of breath than you have felt before. You feel more light-headed than you have felt before. Your incision starts to open up.  This information is not intended to replace advice given to you by your health care provider. Make sure you discuss any questions you have with your health care provider.

## 2024-05-25 NOTE — Progress Notes (Signed)
 Took over care at 1900, discharge instructions reviewed with patient and his wife over the telephone. Denies questions or concern at this time. PT ambulated to the bathroom was able to void without difficulty. PT escorted from the unit via wheel chair to personal vehicle.

## 2024-05-25 NOTE — H&P (Signed)
 Electrophysiology Note:   Date:  05/25/24  ID:  Kenneth Levander Debby Mickey., DOB Jan 03, 1971, MRN [id number]   Primary Cardiologist: Kenneth Fell, MD Electrophysiologist: [name], MD       History of Present Illness:   Kenneth Spilde. is a 53 y.o. male with h/o CAD (CTO of the LAD, PCI to RCA), chronic systolic heart failure, HTN, tobacco use who is being seen today for evaluation for ICD implant at the request of Dr. Gardenia.   Discussed the use of AI scribe software for clinical note transcription with the patient, who gave verbal consent to proceed.   History of Present Illness Kenneth Coates. is a 53 year old male with reduced ejection fraction who presents for evaluation of defibrillator placement.    He has a history of myocardial infarction and subsequent stent placement. Despite being on medications, his heart's pumping function remains below 35%. He understands the defibrillator as an insurance policy to protect against life-threatening heart rhythms, especially when alone or in situations where immediate help may not be available. He is very concerned about his long term risk, as is his wife who has accompanied him today.   He inquires about the sensation of the defibrillator shock, having read that it feels like a [place] kicking you. He is concerned about the restrictions post-procedure, particularly regarding driving and lifting. 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    Interval: Patient presents today for planned ICD implant. Had RHC yesterday. No plans for advanced therapies at this time. Reports feeling relatively well. No new or acute complaints.   Review of systems complete and found to be negative unless listed in HPI.    EP Information / Studies Reviewed:     EKG is not ordered today. EKG from 04/15/24 reviewed which showed SR with PR and QRS .        Echo 03/2024:   1. Global LV hypokinesis with  mid-apical akinesis.. Left ventricular  ejection fraction, by estimation, is 20 to 25%. Left ventricular ejection  fraction by 2D MOD biplane is 21.6 %. The left ventricle has severely  decreased function. The left ventricle  demonstrates global hypokinesis. The left ventricular internal cavity size  was mildly dilated. Left ventricular diastolic parameters are consistent  with Grade I diastolic dysfunction (impaired relaxation).   2. Right ventricular systolic function is normal. The right ventricular  size is normal.   3. The mitral valve is normal in structure. Mild mitral valve  regurgitation.   4. The aortic valve is tricuspid. Aortic valve regurgitation is not  visualized.      Physical Exam:    Today's Vitals   05/25/24 1241  BP: 106/64  Pulse: (!) 56  Resp: 17  Temp: 97.7 F (36.5 C)  TempSrc: Oral  SpO2: 95%  Weight: 74.8 kg  Height: 5' 9 (1.753 m)   Body mass index is 24.37 kg/m.   GEN: Well nourished, well developed in no acute distress NECK: No JVD CARDIAC: Normal rate, regular rhythm. RESPIRATORY:  Clear to auscultation without rales, wheezing or rhonchi  ABDOMEN: Soft, non-distended EXTREMITIES:  No edema; No deformity    ASSESSMENT AND PLAN:     #Chronic systolic heart failure: NYHA II/III. #Ischemic cardiomyopathy - Patient meets criteria for primary prevention ICD given that LVEF remains below 35% despite optimal medical therapy and greater than 90 days from revascularization. Explained risks, benefits, and alternatives to ICD implantation,  including but not limited to bleeding, infection, damage to heart or lungs, heart attack, stroke, or death.  Pt verbalized understanding and wants to proceed. Will plan for [employer] DDD ICD. - Continue GDMT regimen of empagliflozin  10 mg daily, losartan  12.5 mg daily, metoprolol  XL 25 mg daily. - Close follow-up with HF clinic.   #CAD: CTO of LAD, s/p PCI to RCA #Hyperlipidemia: - Continue aspirin  81 mg  daily, Plavix  75 mg daily, atorvastatin  80 mg daily.   Follow up with Dr. Kennyth 3 months after implant.    Signed, [name], MD

## 2024-05-26 ENCOUNTER — Encounter (HOSPITAL_COMMUNITY): Payer: Self-pay | Admitting: Cardiology

## 2024-05-27 ENCOUNTER — Other Ambulatory Visit: Payer: Self-pay

## 2024-06-01 ENCOUNTER — Other Ambulatory Visit: Payer: Self-pay

## 2024-06-06 ENCOUNTER — Other Ambulatory Visit: Payer: Self-pay

## 2024-06-06 ENCOUNTER — Ambulatory Visit: Attending: Internal Medicine

## 2024-06-06 DIAGNOSIS — I5022 Chronic systolic (congestive) heart failure: Secondary | ICD-10-CM

## 2024-06-06 LAB — CUP PACEART INCLINIC DEVICE CHECK
Date Time Interrogation Session: 20250923105700
HighPow Impedance: 67 Ohm
Implantable Lead Connection Status: 753985
Implantable Lead Connection Status: 753985
Implantable Lead Implant Date: 20250911
Implantable Lead Implant Date: 20250911
Implantable Lead Location: 753859
Implantable Lead Location: 753860
Implantable Lead Model: 672
Implantable Lead Model: 7841
Implantable Lead Serial Number: 1652763
Implantable Lead Serial Number: 294977
Implantable Pulse Generator Implant Date: 20250911
Lead Channel Impedance Value: 429 Ohm
Lead Channel Impedance Value: 682 Ohm
Lead Channel Pacing Threshold Amplitude: 0.7 V
Lead Channel Pacing Threshold Amplitude: 0.9 V
Lead Channel Pacing Threshold Amplitude: 0.9 V
Lead Channel Pacing Threshold Pulse Width: 0.4 ms
Lead Channel Pacing Threshold Pulse Width: 0.4 ms
Lead Channel Pacing Threshold Pulse Width: 0.4 ms
Lead Channel Sensing Intrinsic Amplitude: 23.5 mV
Lead Channel Sensing Intrinsic Amplitude: 6.4 mV
Lead Channel Setting Pacing Amplitude: 3.5 V
Lead Channel Setting Pacing Amplitude: 3.5 V
Lead Channel Setting Pacing Pulse Width: 0.4 ms
Lead Channel Setting Sensing Sensitivity: 0.5 mV
Pulse Gen Serial Number: 703754
Zone Setting Status: 755011

## 2024-06-06 NOTE — Patient Instructions (Signed)

## 2024-06-06 NOTE — Progress Notes (Signed)
 Normal dual chamber ICD wound check. Wound evaluated.  Stitch noted at distal end of incision-stitch clipped.  Bandaid placed d/t slight serosanguineous drainage.  Old bruising noted.  No active bleeding noted.  Pt advised to continue to monitor and notify device clinic if any change. Presenting rhythm: AS/VS 62 . Routine testing performed. Thresholds, sensing, and impedance consistent with implant measurements with 3.5V safety margin/auto capture until 3 month visit. No treated arrhythmias. Reviewed arm restrictions to continue for 6 weeks total post op. Reviewed shock plan.  Pt enrolled in remote follow-up.

## 2024-06-10 ENCOUNTER — Ambulatory Visit: Payer: Self-pay | Admitting: Cardiology

## 2024-06-16 ENCOUNTER — Other Ambulatory Visit: Payer: Self-pay

## 2024-07-03 ENCOUNTER — Other Ambulatory Visit: Payer: Self-pay

## 2024-07-06 ENCOUNTER — Ambulatory Visit (INDEPENDENT_AMBULATORY_CARE_PROVIDER_SITE_OTHER)

## 2024-07-06 DIAGNOSIS — I5022 Chronic systolic (congestive) heart failure: Secondary | ICD-10-CM | POA: Diagnosis not present

## 2024-07-07 LAB — CUP PACEART REMOTE DEVICE CHECK
Battery Remaining Longevity: 156 mo
Battery Remaining Percentage: 100 %
Brady Statistic RA Percent Paced: 19 %
Brady Statistic RV Percent Paced: 0 %
Date Time Interrogation Session: 20251023100600
HighPow Impedance: 68 Ohm
Implantable Lead Connection Status: 753985
Implantable Lead Connection Status: 753985
Implantable Lead Implant Date: 20250911
Implantable Lead Implant Date: 20250911
Implantable Lead Location: 753859
Implantable Lead Location: 753860
Implantable Lead Model: 672
Implantable Lead Model: 7841
Implantable Lead Serial Number: 1652763
Implantable Lead Serial Number: 294977
Implantable Pulse Generator Implant Date: 20250911
Lead Channel Impedance Value: 424 Ohm
Lead Channel Impedance Value: 729 Ohm
Lead Channel Pacing Threshold Amplitude: 0.4 V
Lead Channel Pacing Threshold Amplitude: 0.6 V
Lead Channel Pacing Threshold Pulse Width: 0.4 ms
Lead Channel Pacing Threshold Pulse Width: 0.4 ms
Lead Channel Setting Pacing Amplitude: 3.5 V
Lead Channel Setting Pacing Amplitude: 3.5 V
Lead Channel Setting Pacing Pulse Width: 0.4 ms
Lead Channel Setting Sensing Sensitivity: 0.5 mV
Pulse Gen Serial Number: 703754
Zone Setting Status: 755011

## 2024-07-07 NOTE — Progress Notes (Signed)
 Remote ICD Transmission

## 2024-07-08 ENCOUNTER — Ambulatory Visit: Payer: Self-pay | Admitting: Cardiology

## 2024-07-11 ENCOUNTER — Other Ambulatory Visit: Payer: Self-pay | Admitting: Family

## 2024-07-17 ENCOUNTER — Telehealth: Payer: Self-pay | Admitting: Family

## 2024-07-17 NOTE — Telephone Encounter (Signed)
 Called to confirm/remind patient of their appointment at the [facility] on 07/18/24.   Appointment:   [x] Confirmed  [] Left mess   [] No answer/No voice mail  [] VM Full/unable to leave message  [] Phone not in service  Patient reminded to bring all medications and/or complete list.  Confirmed patient has transportation. Gave directions, instructed to utilize valet parking.

## 2024-07-17 NOTE — Progress Notes (Unsigned)
 ADVANCED HEART FAILURE CLINIC NOTE  Referring Physician: [facility], [facility]  Primary Care: Administration, [employer] Primary Cardiologist: Dr. Wonda HF: Dr. Gardenia  CC: Heart failure with reduced EF  HPI: Kenneth Medina. is a 53 y.o. male with CAD (CTO of the LAD, PCI to RCA), HFrEF, HTN, tobacco use presenting today for follow up. [name]   He reports that his symptoms of heart failure/dyspnea/chest pain started on 08/19/23. 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 Patient came to the [facility] ER where he was told that everything was okay. He continued working but had severe dyspnea/anxiety/panic attacks. He was then seen by Dr. Wonda outpatient, where EKG showed anterolateral Q waves. He was admitted for LHC; LVEDP was 43. Patient was diuresed and underwent staged intervention to the RCA. LAD w/ proximal CTO. TTE with severely reduced LVEF and akinesis of the mid to distal anterolateral LV.   Pertinent Family & social hx:  Continues to smoke however has cut back significantly  Interval hx:  - He presents today with a chief complaint of fatigue. States he has felt , worn down for the last 1-2 weeks. - Endorses significant external stress. Has support from wife.  - Denies HF symptoms: SHOB, chest pain, cough, dizziness, palpitations, edema, orthopnea, and PND.  - Continues to smoke, currently smokes less than a pack a day, peak at 1-2 ppd  - Discussed breathe right cigarettes for smoking cessation - Rare ETOH use, beer 2 months ago, first in 8-9 years  - ICD in place, no complications or shocks noted.  - Sees EP 08/25/24   DATA REVIEW  ICD:  05/25/24: dual chamber ICD placement with no complication   ECG: 10/19/23: NSR w/ anterolateral Q wave as per my personal interpretation 05/25/24: NSR w/ anterolateral  infarction  ECHO: 10/16/23: LVEF 25%-30%, akinesis of the entire anterior/anterolateral myocardium as per my personal  interpretation 03/24/24: LVEF 20%-25%, mild akinesis and hypokinesis    CATH: 10/15/23:    Prox LAD to Mid LAD lesion is 100% stenosed.   Prox RCA to Mid RCA lesion is 80% stenosed.   Prox LAD lesion is 30% stenosed. PCI to the RCA on 10/18/23 RHC 05/24/24   Vitals:   07/18/24 0842  BP: 103/66  Pulse: 60  SpO2: 99%  Weight: 76.2 kg   Wt Readings from Last 3 Encounters:  07/18/24 76.2 kg  05/25/24 74.8 kg  05/24/24 74.8 kg   PHYSICAL EXAM: General: Well appearing. No acute signs of distress.  Cor: No JVD. Regular rhythm, rate.  Lungs: Clear bilaterally. Symmetrical chest expansion.  Abdomen: soft, nontender, nondistended. Extremities: No edema, cyanosis, or rashes. Warm and well perfused.  Neuro:. AO x3. Affect pleasant    ASSESSMENT & PLAN:  Heart failure with reduced EF Etiology of YQ:Pdryzfpr cardiomyopathy; CTO of the LAD with anterior Q waves NYHA class / AHA Stage:NYHA I  Volume status & Diuretics: Euvolemic on lasix  20mg  daily.  Weight stable since last visit nearly 2 months ago  Vasodilators:Losartan  12.5mg  daily; limited by SBP Beta-Blocker: HR 60 today; continue toprol  25mg  daily. May need to decrease if he remains bradycardic.  MRA: continue to hold due to hypotension and prior K+  Cardiometabolic:Jardiance  10mg  daily Devices therapies & Valvulopathies:Repeat TTE on 03/24/24 with LVEF 20-25%. ICD implant on 05/25/24.  Advanced therapies: He is not a candidate for transplant due to continued tobacco use.   2. CAD - Followed by Dr. Wonda; CTO of the LAD after  MI in 12/24.  - PCI to the RCA in 2/25 by Dr. Anner - Continue DAPT, ASA+Plavix  - Continue lipitor  80mg  daily - Stable; no chest pain.   3. Tobacco use - Discussed importance of smoking cessation.  - Will reassess use of breathe right cigarettes at next visit   4. Fatigue  - dicussed starting Wellbutrin , patient reports previous significant psychiatric response ot medication.  - discussed healthy  coping mechanisms for external stress - discussed RHA services  - Advised to seek emergency support for SI/HI  Follow up in January, sooner if needed.    Ellouise Class, FNP-C / Solomon Edilberto Roosevelt, FNP-S  07/18/24

## 2024-07-18 ENCOUNTER — Ambulatory Visit: Payer: Self-pay | Attending: Family | Admitting: Family

## 2024-07-18 ENCOUNTER — Encounter: Payer: Self-pay | Admitting: Family

## 2024-07-18 VITALS — BP 103/66 | HR 60 | Wt 168.0 lb

## 2024-07-18 DIAGNOSIS — F17210 Nicotine dependence, cigarettes, uncomplicated: Secondary | ICD-10-CM | POA: Insufficient documentation

## 2024-07-18 DIAGNOSIS — Z79899 Other long term (current) drug therapy: Secondary | ICD-10-CM | POA: Insufficient documentation

## 2024-07-18 DIAGNOSIS — Z95810 Presence of automatic (implantable) cardiac defibrillator: Secondary | ICD-10-CM | POA: Insufficient documentation

## 2024-07-18 DIAGNOSIS — Z720 Tobacco use: Secondary | ICD-10-CM

## 2024-07-18 DIAGNOSIS — I959 Hypotension, unspecified: Secondary | ICD-10-CM | POA: Insufficient documentation

## 2024-07-18 DIAGNOSIS — I255 Ischemic cardiomyopathy: Secondary | ICD-10-CM | POA: Insufficient documentation

## 2024-07-18 DIAGNOSIS — R5383 Other fatigue: Secondary | ICD-10-CM

## 2024-07-18 DIAGNOSIS — I2510 Atherosclerotic heart disease of native coronary artery without angina pectoris: Secondary | ICD-10-CM

## 2024-07-18 DIAGNOSIS — I252 Old myocardial infarction: Secondary | ICD-10-CM | POA: Insufficient documentation

## 2024-07-18 DIAGNOSIS — Z7902 Long term (current) use of antithrombotics/antiplatelets: Secondary | ICD-10-CM | POA: Insufficient documentation

## 2024-07-18 DIAGNOSIS — Z955 Presence of coronary angioplasty implant and graft: Secondary | ICD-10-CM | POA: Insufficient documentation

## 2024-07-18 DIAGNOSIS — I5022 Chronic systolic (congestive) heart failure: Secondary | ICD-10-CM

## 2024-07-18 NOTE — Patient Instructions (Signed)
 Medication Changes:  No medication changes today!    Follow-Up in: Please follow up with the Advanced Heart Failure Clinic in January 2026.   Thank you for choosing [facility] Advanced Heart Failure Clinic.    At the Advanced Heart Failure Clinic, you and your health needs are our priority. We have a designated team specialized in the treatment of Heart Failure. This Care Team includes your primary Heart Failure Specialized Cardiologist (physician), Advanced Practice Providers (APPs- Physician Assistants and Nurse Practitioners), and Pharmacist who all work together to provide you with the care you need, when you need it.   You may see any of the following providers on your designated Care Team at your next follow up:  Dr. Toribio Fuel Dr. Ezra Shuck Dr. [name] Dr. Morene Brownie Ellouise Class, FNP Jaun Bash, RPH-CPP  Please be sure to bring in all your medications bottles to every appointment.   Need to Contact Us :  If you have any questions or concerns before your next appointment please send us  a message through [place] or call our office at [phone].    TO LEAVE A MESSAGE FOR THE NURSE SELECT OPTION 2, PLEASE LEAVE A MESSAGE INCLUDING: YOUR NAME DATE OF BIRTH CALL BACK NUMBER REASON FOR CALL**this is important as we prioritize the call backs  YOU WILL RECEIVE A CALL BACK THE SAME DAY AS LONG AS YOU CALL BEFORE 4:00 PM

## 2024-07-31 ENCOUNTER — Other Ambulatory Visit: Payer: Self-pay

## 2024-08-01 ENCOUNTER — Other Ambulatory Visit: Payer: Self-pay

## 2024-08-01 ENCOUNTER — Other Ambulatory Visit: Payer: Self-pay | Admitting: Cardiology

## 2024-08-01 DIAGNOSIS — I25119 Atherosclerotic heart disease of native coronary artery with unspecified angina pectoris: Secondary | ICD-10-CM

## 2024-08-01 MED ORDER — CLOPIDOGREL BISULFATE 75 MG PO TABS
75.0000 mg | ORAL_TABLET | Freq: Every day | ORAL | 2 refills | Status: AC
  Filled 2024-08-01: qty 90, 90d supply, fill #0

## 2024-08-16 ENCOUNTER — Other Ambulatory Visit: Payer: Self-pay

## 2024-08-25 ENCOUNTER — Ambulatory Visit: Admitting: Cardiology

## 2024-08-27 NOTE — Progress Notes (Unsigned)
 Electrophysiology Clinic Note    Date:  08/29/2024  Patient ID:  Kenneth Medina., DOB 10-06-1970, MRN [id number] PCP:  [facility]ration, [facility]  Cardiologist:  Ozell Fell, MD  Electrophysiologist:  [name], MD  Electrophysiology APP:  [name], NP  Advanced Heart Failure:  [name], DO     Discussed the use of AI scribe software for clinical note transcription with the patient, who gave verbal consent to proceed.   Patient Profile    Chief Complaint: ICD follow-up  History of Present Illness: Kenneth Medina. is a 53 y.o. male with PMH notable for CAD, HFrEF, ICM s/p ICD, HTN, tobacco use; seen today for [name], MD for routine electrophysiology follow-up s/p Defibrillator implant.  He is s/p dual chamber ICD implant 05/2024.  He saw HF NP [facility] where it was noted he had reduced tobacco use to 1/2 ppd (previously 1-2ppd), GDMT limited by borderline BP.   On follow-up today, he is adjusting well to his cardiac device. He describes it as present but not painful, tender, or bothersome. He has occasional generalized itchiness around the incision site similar to elsewhere on his body. He has no chest pain, pressure, palpitations, or racing heart.  He feels more fatigued, particularly in the last week. 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 He sleeps well at night. He is on Jardiance , metoprolol , and losartan , which he takes around 9 PM. He does not check his blood pressure regularly at home, but he notes it is usually in the low 90s. He has intermittent dizziness, lightheadedness, and blurry vision, especially after exertion such as carrying groceries.  He takes a diuretic 20 mg daily and increases to 40 mg with higher salt intake. His weight is stable within 2 pounds.  He has depression and recently disposed of his guns due to concern for his  mental health. He plans to go to the [place] hospital to discuss mental health treatment and medications.  He is trying to quit smoking and has reduced from a pack per day to half a pack per day. He desires to stop smoking completely.      Arrhythmia/Device History Bos Sci dual chamber ICD, imp 05/2024; dx CHF    ROS:  Please see the history of present illness. All other systems are reviewed and otherwise negative.    Physical Exam    VS:  BP 110/66   Pulse 60   Ht 5' 9 (1.753 m)   Wt 168 lb 9.6 oz (76.5 kg)   SpO2 95%   BMI 24.90 kg/m  BMI: Body mass index is 24.9 kg/m.           Wt Readings from Last 3 Encounters:  08/29/24 168 lb 9.6 oz (76.5 kg)  07/18/24 168 lb (76.2 kg)  05/25/24 165 lb (74.8 kg)     GEN- The patient is well appearing, alert and oriented x 3 today.   Lungs- Clear to ausculation bilaterally, normal work of breathing.  Heart- Regular rate and rhythm, no murmurs, rubs or gallops Extremities- No peripheral edema, warm, dry Skin-  device pocket well-healed, no tethering   Device interrogation done today and reviewed by myself:  Battery 13 years Lead thresholds, impedence, sensing stable  VP <1% No episodes Turned ON auto capture for leads  Studies Reviewed   Previous EP, cardiology notes.    EKG is ordered. Personal review  of EKG from today shows:    EKG Interpretation Date/Time:  Tuesday August 29 2024 09:35:02 EST Ventricular Rate:  60 PR Interval:  158 QRS Duration:  104 QT Interval:  430 QTC Calculation: 430 R Axis:   4  Text Interpretation: ATRIAL PACED RHYTHM Minimal voltage criteria for LVH, may be normal variant ( Cornell product ) Confirmed by Arlinda Barcelona [phone]) on 08/29/2024 9:49:24 AM    TTE, 03/24/2024  1. Global LV hypokinesis with mid-apical akinesis.. Left ventricular ejection fraction, by estimation, is 20 to 25%. Left ventricular ejection fraction by 2D MOD biplane is 21.6 %. The left ventricle has severely  decreased function. The left ventricle demonstrates global hypokinesis. The left ventricular internal cavity size was mildly dilated. Left ventricular diastolic parameters are consistent with Grade I diastolic dysfunction (impaired relaxation).   2. Right ventricular systolic function is normal. The right ventricular size is normal.   3. The mitral valve is normal in structure. Mild mitral valve regurgitation.   4. The aortic valve is tricuspid. Aortic valve regurgitation is not visualized.   Coronary stent intervention, 10/18/2023   Very large caliber RCA with prox RCA lesion 20% stenosis, but significant catheter related spasm resolved with nitroglycerin .   Prox RCA to Mid RCA lesion is 80% stenosed.  TIMI-3 flow   A drug-eluting stent was successfully placed covering the 80% stenosis, using a STENT ONYX FRONTIER [id number].  Postdilated to 4.6 mm. Post intervention, there is a 0% residual stenosis. -TIMI-3 flow maintained  TTE, 10/16/2023  1. Swirling of contrast in the LV apex but no evidence of LV thrombus. Left ventricular ejection fraction, by estimation, is 30 to 35%. The left ventricle has moderately decreased function. The left ventricle demonstrates regional wall motion abnormalities in a pattern consistent with LAD infarction versus Takostubo cardiomyopathy. Left ventricular diastolic parameters are consistent with Grade I diastolic dysfunction (impaired relaxation).   2. Right ventricular systolic function is normal. The right ventricular size is normal. Tricuspid regurgitation signal is inadequate for assessing A pressure.   3. The mitral valve is normal in structure. Trivial mitral valve regurgitation. No evidence of mitral stenosis.   4. The aortic valve is normal in structure. Aortic valve regurgitation is not visualized. No aortic stenosis is present.   5. Aortic dilatation noted. There is borderline dilatation of the aortic root, measuring 39 mm.   6. The inferior vena cava is normal in  size with greater than 50% respiratory variability, suggesting right atrial pressure of 3 mmHg.   Comparison(s): Changes from prior study are noted. LVEF worsened from normal to 30-35% with RWMA now.   LHC, 10/15/2023   Prox LAD to Mid LAD lesion is 100% stenosed.   Prox RCA to Mid RCA lesion is 80% stenosed.   Prox LAD lesion is 30% stenosed.   1.  Chronically occluded mid LAD stent. 2.  High-grade mid right RCA lesion. 3.  LVEDP of 43 mmHg with ventriculography demonstrating severe LV dysfunction.   Assessment and Plan     #) ICM s/p ICD Device functioning well, see paceart for details Battery good Stable lead measurements No concerning arrhythmias Low VP HeartLogic score 0   #) HFrEF #) fatigue Increased fatigue and sleepiness possibly related to heart failure. Low blood pressure potentially due to medications. Weight stable, indicating good fluid management. Emphasized maintaining optimal medication doses to reduce hospitalizations and improve survival, while monitoring for hypotension. - Check blood pressure at home regularly for hypotension, specifically after medications to ensure no  hypotension   #) tobacco use Encouraged cessation, congratulated on reduced use       Current medicines are reviewed at length with the patient today.   The patient has concerns regarding his medicines.  The following changes were made today:  none  Labs/ tests ordered today include:  Orders Placed This Encounter  Procedures   EKG 12-Lead     Disposition: Follow up with Dr. Kennyth or EP APP in 12 months, continue remote monitoring every 3 months   Signed, Chantal Needle, NP  08/29/2024  1:42 PM  Electrophysiology [facility]

## 2024-08-28 ENCOUNTER — Other Ambulatory Visit: Payer: Self-pay

## 2024-08-28 ENCOUNTER — Other Ambulatory Visit: Payer: Self-pay | Admitting: Family

## 2024-08-29 ENCOUNTER — Other Ambulatory Visit: Payer: Self-pay

## 2024-08-29 ENCOUNTER — Encounter: Payer: Self-pay | Admitting: Cardiology

## 2024-08-29 ENCOUNTER — Ambulatory Visit: Attending: Cardiology | Admitting: Cardiology

## 2024-08-29 VITALS — BP 110/66 | HR 60 | Ht 69.0 in | Wt 168.6 lb

## 2024-08-29 DIAGNOSIS — Z95810 Presence of automatic (implantable) cardiac defibrillator: Secondary | ICD-10-CM | POA: Insufficient documentation

## 2024-08-29 DIAGNOSIS — Z720 Tobacco use: Secondary | ICD-10-CM

## 2024-08-29 DIAGNOSIS — I255 Ischemic cardiomyopathy: Secondary | ICD-10-CM

## 2024-08-29 DIAGNOSIS — I5020 Unspecified systolic (congestive) heart failure: Secondary | ICD-10-CM

## 2024-08-29 LAB — CUP PACEART INCLINIC DEVICE CHECK
Date Time Interrogation Session: 20251216134818
Implantable Lead Connection Status: 753985
Implantable Lead Connection Status: 753985
Implantable Lead Implant Date: 20250911
Implantable Lead Implant Date: 20250911
Implantable Lead Location: 753859
Implantable Lead Location: 753860
Implantable Lead Model: 672
Implantable Lead Model: 7841
Implantable Lead Serial Number: 1652763
Implantable Lead Serial Number: 294977
Implantable Pulse Generator Implant Date: 20250911
Lead Channel Setting Pacing Amplitude: 2 V
Lead Channel Setting Pacing Amplitude: 2 V
Lead Channel Setting Pacing Pulse Width: 0.4 ms
Lead Channel Setting Sensing Sensitivity: 0.5 mV
Pulse Gen Serial Number: 703754
Zone Setting Status: 755011

## 2024-08-29 MED FILL — Metoprolol Succinate Tab ER 24HR 25 MG (Tartrate Equiv): ORAL | 90 days supply | Qty: 90 | Fill #0 | Status: AC

## 2024-08-29 NOTE — Patient Instructions (Signed)
 Medication Instructions:  Your physician recommends that you continue on your current medications as directed. Please refer to the Current Medication list given to you today.  *If you need a refill on your cardiac medications before your next appointment, please call your pharmacy*  Lab Work: No labs ordered today    Testing/Procedures: No test ordered today   Follow-Up:  Home Blood Pressure checks.  Please keep your Heart Failure Clinic appointment.  At [facility], you and your health needs are our priority.  As part of our continuing mission to provide you with exceptional heart care, our providers are all part of one team.  This team includes your primary Cardiologist (physician) and Advanced Practice Providers or APPs (Physician Assistants and Nurse Practitioners) who all work together to provide you with the care you need, when you need it.  Your next appointment:   1 year(s)  Provider:   Suzann Riddle, NP

## 2024-08-30 ENCOUNTER — Other Ambulatory Visit: Payer: Self-pay

## 2024-08-30 ENCOUNTER — Ambulatory Visit: Payer: Self-pay | Admitting: Cardiology

## 2024-09-05 ENCOUNTER — Other Ambulatory Visit: Payer: Self-pay

## 2024-09-19 ENCOUNTER — Telehealth: Payer: Self-pay | Admitting: Family

## 2024-09-19 NOTE — Telephone Encounter (Signed)
 Called to confirm/remind patient of their appointment at the Advanced Heart Failure Clinic on 09/20/24.   Appointment:   [x] Confirmed  [] Left mess   [] No answer/No voice mail  [] VM Full/unable to leave message  [] Phone not in service  Patient reminded to bring all medications and/or complete list.  Confirmed patient has transportation. Gave directions, instructed to utilize valet parking.

## 2024-09-19 NOTE — Progress Notes (Unsigned)
 "  ADVANCED HEART FAILURE CLINIC NOTE  Referring Physician: Administration, [facility]  Primary Care: Administration, [employer] Primary Cardiologist: Dr. Wonda HF:   CC: fatigue  HPI: Kenneth Medina. is a 54 y.o. male with CAD (CTO of the LAD, PCI to RCA), HFrEF, HTN, tobacco use presenting today for follow up. Kenneth Medina   He reports that his symptoms of heart failure/dyspnea/chest pain started on 08/19/23. 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 Patient came to the [facility] ER where he was told that everything was okay. He continued working but had severe dyspnea/anxiety/panic attacks. He was then seen by Dr. Wonda outpatient, where EKG showed anterolateral Q waves. He was admitted for LHC; LVEDP was 43. Patient was diuresed and underwent staged intervention to the RCA. LAD w/ proximal CTO. TTE with severely reduced LVEF and akinesis of the mid to distal anterolateral LV.   Pertinent Family & social hx:  Continues to smoke however has cut back significantly  He presents today for a HF follow-up visit with a chief complaint of fatigue. Has had some shortness of breath, rare palpitations, cough, head congestion. Sleeping ok. Wife reports that he snores. He's also having issues with erectile dysfunction which has worsened over the last month during the holidays. Continues to struggle with depression. Reports a good appetite. Denies fever, chest pain, dizziness.   - Continues to smoke, currently smokes less than a pack a day, peak at 1-2 ppd. Has not started breathe right cigarettes for smoking cessation - Rare ETOH use - ICD in place, no complications or shocks noted.    DATA REVIEW  ICD:  05/25/24: dual chamber ICD placement with no complication   ECG: 10/19/23: NSR w/ anterolateral Q wave  05/25/24: NSR w/ anterolateral  infarction 08/29/24: AP  ECHO: 10/16/23: LVEF 25%-30%, akinesis of the entire anterior/anterolateral myocardium  03/24/24: LVEF  20%-25%, mild akinesis and hypokinesis    CATH: 10/15/23:    Prox LAD to Mid LAD lesion is 100% stenosed.   Prox RCA to Mid RCA lesion is 80% stenosed.   Prox LAD lesion is 30% stenosed. PCI to the RCA on 10/18/23 RHC 05/24/24   Device interrogation: 09/20/24: HL score is 0, thoracic impedance is 51.4 & rising, activity level of 1.4 hours / day   PHYSICAL EXAM: General: Well appearing.  Cor: No JVD. Regular rhythm, rate.  Lungs: rhonchi RLL Abdomen: soft, nontender, nondistended. Extremities: no edema Neuro:. 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  Weight down 3 pounds since last visit 2 months ago  Vasodilators:Losartan  12.5mg  daily; limited by SBP Beta-Blocker: HR 70 today; continue toprol  25mg  daily.  MRA: continue to hold due to hypotension and prior K+  Cardiometabolic:Jardiance  10mg  daily Devices therapies & Valvulopathies:Repeat TTE on 03/24/24 with LVEF 20-25%. ICD implant on 05/25/24.  Advanced therapies: He is not a candidate for transplant due to continued tobacco use.   2. CAD - Followed by Dr. Wonda; CTO of the LAD after MI in 12/24.  - PCI to the RCA in 2/25 by Dr. Anner - Continue DAPT, ASA+Plavix  - Continue lipitor  80mg  daily - LDL 12/16/23 was 53 - Stable; no chest pain.   3. Tobacco use - Discussed importance of smoking cessation.  - Has not started breathe right cigarettes as he's  had difficulty dealing with holidays   4. Depression  - discussed healthy coping mechanisms for external stress - discussed RHA services  - Advised to seek emergency support if he develops SI/HI  5: HTN- - BP 114/70 - gets primary care at [facility] - BMET 05/22/24 reviewed: K+ 4, creatinine 0.76, GFR >60  6: Erectile dysfunction- - asking about treatment for this. Says that it's been  worse over the last month and he's unable to obtain and erection - Is on betablocker which could be contributing to this but more likely it's his depression since his ED is worse over the last month during the holidays - Does not experience chest pain / dizziness and is not on nitrates. Will trial a few doses of viagra  25mg  PRN with continued treatment through his PCP at the [place]  7: Snoring- - He says that wife says he snores. Currently experiencing fatigue - Agreeable to a sleep study to r/o OSA but will need to have staff check about coverage since he goes to the [place]   Return in 1 month to get established with new HF MD, sooner if needed.   I spent 43 minutes reviewing records, interviewing/ examing patient and managing plan/ orders.   Kenneth Medina Class FNP-C 09/19/2024 "

## 2024-09-20 ENCOUNTER — Encounter: Payer: Self-pay | Admitting: Family

## 2024-09-20 ENCOUNTER — Ambulatory Visit: Admitting: Family

## 2024-09-20 ENCOUNTER — Other Ambulatory Visit: Payer: Self-pay

## 2024-09-20 VITALS — BP 114/70 | HR 70 | Wt 165.6 lb

## 2024-09-20 DIAGNOSIS — Z79899 Other long term (current) drug therapy: Secondary | ICD-10-CM | POA: Insufficient documentation

## 2024-09-20 DIAGNOSIS — I5022 Chronic systolic (congestive) heart failure: Secondary | ICD-10-CM | POA: Insufficient documentation

## 2024-09-20 DIAGNOSIS — Z720 Tobacco use: Secondary | ICD-10-CM

## 2024-09-20 DIAGNOSIS — I255 Ischemic cardiomyopathy: Secondary | ICD-10-CM | POA: Insufficient documentation

## 2024-09-20 DIAGNOSIS — Z7982 Long term (current) use of aspirin: Secondary | ICD-10-CM | POA: Insufficient documentation

## 2024-09-20 DIAGNOSIS — Z955 Presence of coronary angioplasty implant and graft: Secondary | ICD-10-CM | POA: Insufficient documentation

## 2024-09-20 DIAGNOSIS — I2510 Atherosclerotic heart disease of native coronary artery without angina pectoris: Secondary | ICD-10-CM | POA: Insufficient documentation

## 2024-09-20 DIAGNOSIS — I10 Essential (primary) hypertension: Secondary | ICD-10-CM

## 2024-09-20 DIAGNOSIS — I110 Hypertensive heart disease with heart failure: Secondary | ICD-10-CM | POA: Insufficient documentation

## 2024-09-20 DIAGNOSIS — F32A Depression, unspecified: Secondary | ICD-10-CM | POA: Insufficient documentation

## 2024-09-20 DIAGNOSIS — F17210 Nicotine dependence, cigarettes, uncomplicated: Secondary | ICD-10-CM | POA: Insufficient documentation

## 2024-09-20 DIAGNOSIS — R0683 Snoring: Secondary | ICD-10-CM | POA: Insufficient documentation

## 2024-09-20 DIAGNOSIS — I25119 Atherosclerotic heart disease of native coronary artery with unspecified angina pectoris: Secondary | ICD-10-CM

## 2024-09-20 DIAGNOSIS — Z7984 Long term (current) use of oral hypoglycemic drugs: Secondary | ICD-10-CM | POA: Insufficient documentation

## 2024-09-20 DIAGNOSIS — F329 Major depressive disorder, single episode, unspecified: Secondary | ICD-10-CM

## 2024-09-20 DIAGNOSIS — Z95810 Presence of automatic (implantable) cardiac defibrillator: Secondary | ICD-10-CM | POA: Insufficient documentation

## 2024-09-20 DIAGNOSIS — Z7902 Long term (current) use of antithrombotics/antiplatelets: Secondary | ICD-10-CM | POA: Insufficient documentation

## 2024-09-20 DIAGNOSIS — N529 Male erectile dysfunction, unspecified: Secondary | ICD-10-CM | POA: Insufficient documentation

## 2024-09-20 MED ORDER — SILDENAFIL CITRATE 25 MG PO TABS
25.0000 mg | ORAL_TABLET | Freq: Every day | ORAL | 0 refills | Status: AC | PRN
  Filled 2024-09-20: qty 5, 5d supply, fill #0

## 2024-09-20 NOTE — Progress Notes (Signed)
 Patient Name:         DOB:       Height:     Weight:  Office Name:         Referring Provider:  Today's Date:  Date:   STOP BANG RISK ASSESSMENT S (snore) Have you been told that you snore?     YES   T (tired) Are you often tired, fatigued, or sleepy during the day?   YES  O (obstruction) Do you stop breathing, choke, or gasp during sleep? NO   P (pressure) Do you have or are you being treated for high blood pressure? YES   B (BMI) Is your body index greater than 35 kg/m? NO   A (age) Are you 56 years old or older? YES   N (neck) Do you have a neck circumference greater than 16 inches?   NO   G (gender) Are you a male? YES   TOTAL STOP/BANG YES ANSWERS 5                                                                       For Office Use Only              Procedure Order Form    YES to 3+ Stop Bang questions OR two clinical symptoms - patient qualifies for WatchPAT (CPT 95800)             Clinical Notes: Will consult Sleep Specialist and refer for management of therapy due to patient increased risk of Sleep Apnea. Ordering a sleep study due to the following two clinical symptoms: Excessive daytime sleepiness G47.10 / Impotence N52.9 / History of high blood pressure R03.0    I understand that I am proceeding with a home sleep apnea test as ordered by my treating physician. I understand that untreated sleep apnea is a serious cardiovascular risk factor and it is my responsibility to perform the test and seek management for sleep apnea. I will be contacted with the results and be managed for sleep apnea by a local sleep physician. I will be receiving equipment and further instructions from [facility]. I shall promptly ship back the equipment via the included mailing label. I understand my insurance will be billed for the test and as the patient I am responsible for any insurance related out-of-pocket costs incurred. I have been provided with written instructions and can  call for additional video or telephonic instruction, with 24-hour availability of qualified personnel to answer any questions: Patient Help Desk [phone].  Patient Signature ______________________________________________________   Date______________________ Patient Telemedicine Verbal Consent    ITAMAR home sleep study given to patient, all instructions explained, waiver signed, and CLOUDPAT registration complete.

## 2024-09-20 NOTE — Patient Instructions (Signed)
 Medication Changes:  START Sidenafil 25mg  daily as needed.   Testing/Procedures:  Your provider has recommended that you have a home sleep study ([facility] Test).  We have provided you with the equipment in our office today. Please go ahead and download the app. DO NOT OPEN OR TAMPER WITH THE BOX UNTIL WE ADVISE YOU TO DO SO. Once insurance has approved the test our office will call you with PIN number and approval to proceed with testing. Once you have completed the test you just dispose of the equipment, the information is automatically uploaded to us  via blue-tooth technology. If your test is positive for sleep apnea and you need a home CPAP machine you will be contacted by Dr Dorine office [facility]) to set this up.  Someone will contact you in order to let you know if you should complete this test.   Follow-Up in: Please follow up with the Advanced Heart Failure Clinic in 1-3 months with a physician.   Thank you for choosing [facility] Advanced Heart Failure Clinic.    At the Advanced Heart Failure Clinic, you and your health needs are our priority. We have a designated team specialized in the treatment of Heart Failure. This Care Team includes your primary Heart Failure Specialized Cardiologist (physician), Advanced Practice Providers (APPs- Physician Assistants and Nurse Practitioners), and Pharmacist who all work together to provide you with the care you need, when you need it.   You may see any of the following providers on your designated Care Team at your next follow up:  Dr. Toribio Fuel Dr. Ezra Shuck Dr. [name] Dr. Morene Brownie Ellouise Class, FNP Jaun Bash, RPH-CPP  Please be sure to bring in all your medications bottles to every appointment.   Need to Contact Us :  If you have any questions or concerns before your next appointment please send us  a message through [place] or call our office at [phone].    TO LEAVE A MESSAGE FOR THE  NURSE SELECT OPTION 2, PLEASE LEAVE A MESSAGE INCLUDING: YOUR NAME DATE OF BIRTH CALL BACK NUMBER REASON FOR CALL**this is important as we prioritize the call backs  YOU WILL RECEIVE A CALL BACK THE SAME DAY AS LONG AS YOU CALL BEFORE 4:00 PM

## 2024-10-03 ENCOUNTER — Other Ambulatory Visit: Payer: Self-pay

## 2024-10-04 ENCOUNTER — Other Ambulatory Visit: Payer: Self-pay

## 2024-10-04 MED ORDER — ESCITALOPRAM OXALATE 10 MG PO TABS
10.0000 mg | ORAL_TABLET | Freq: Every day | ORAL | 11 refills | Status: AC
  Filled 2024-10-04: qty 30, 30d supply, fill #0

## 2024-10-04 MED ORDER — LOSARTAN POTASSIUM 25 MG PO TABS
25.0000 mg | ORAL_TABLET | Freq: Every day | ORAL | 11 refills | Status: AC
  Filled 2024-10-04: qty 30, 30d supply, fill #0

## 2024-10-05 ENCOUNTER — Ambulatory Visit

## 2024-10-05 DIAGNOSIS — I255 Ischemic cardiomyopathy: Secondary | ICD-10-CM | POA: Diagnosis not present

## 2024-10-06 LAB — CUP PACEART REMOTE DEVICE CHECK
Battery Remaining Longevity: 156 mo
Battery Remaining Percentage: 100 %
Brady Statistic RA Percent Paced: 16 %
Brady Statistic RV Percent Paced: 0 %
Date Time Interrogation Session: 20260122214800
HighPow Impedance: 82 Ohm
Implantable Lead Connection Status: 753985
Implantable Lead Connection Status: 753985
Implantable Lead Implant Date: 20250911
Implantable Lead Implant Date: 20250911
Implantable Lead Location: 753859
Implantable Lead Location: 753860
Implantable Lead Model: 672
Implantable Lead Model: 7841
Implantable Lead Serial Number: 1652763
Implantable Lead Serial Number: 294977
Implantable Pulse Generator Implant Date: 20250911
Lead Channel Impedance Value: 420 Ohm
Lead Channel Impedance Value: 752 Ohm
Lead Channel Pacing Threshold Amplitude: 0.4 V
Lead Channel Pacing Threshold Amplitude: 0.8 V
Lead Channel Pacing Threshold Pulse Width: 0.4 ms
Lead Channel Pacing Threshold Pulse Width: 0.4 ms
Lead Channel Setting Pacing Amplitude: 2 V
Lead Channel Setting Pacing Amplitude: 2 V
Lead Channel Setting Pacing Pulse Width: 0.4 ms
Lead Channel Setting Sensing Sensitivity: 0.5 mV
Pulse Gen Serial Number: 703754
Zone Setting Status: 755011

## 2024-10-09 ENCOUNTER — Ambulatory Visit: Payer: Self-pay | Admitting: Cardiology

## 2024-10-09 NOTE — Progress Notes (Signed)
 Remote ICD Transmission

## 2024-10-11 ENCOUNTER — Encounter

## 2024-10-11 VITALS — Ht 69.5 in | Wt 166.8 lb

## 2024-10-11 DIAGNOSIS — I5022 Chronic systolic (congestive) heart failure: Secondary | ICD-10-CM | POA: Insufficient documentation

## 2024-10-11 DIAGNOSIS — I5020 Unspecified systolic (congestive) heart failure: Secondary | ICD-10-CM | POA: Insufficient documentation

## 2024-10-11 DIAGNOSIS — N529 Male erectile dysfunction, unspecified: Secondary | ICD-10-CM | POA: Insufficient documentation

## 2024-10-11 DIAGNOSIS — Z95810 Presence of automatic (implantable) cardiac defibrillator: Secondary | ICD-10-CM | POA: Insufficient documentation

## 2024-10-11 NOTE — Patient Instructions (Signed)
 Patient Instructions  Patient Details  Name: Kenneth Medina. MRN: [id number] Date of Birth: 1970-09-18 Referring Provider:  Amalia Bruckner, MD  Below are your personal goals for exercise, nutrition, and risk factors. Our goal is to help you stay on track towards obtaining and maintaining these goals. We will be discussing your progress on these goals with you throughout the program.  Initial Exercise Prescription:  Initial Exercise Prescription - 10/11/24 1500       Date of Initial Exercise RX and Referring Provider   Date 10/11/24    Referring Provider Amalia Bruckner, MD      Oxygen   Maintain Oxygen Saturation 88% or higher      Treadmill   MPH 2.3    Grade 0    Minutes 15    METs 2.76      NuStep   Level 3    SPM 80    Minutes 15    METs 3.43      Arm Ergometer   Level 1    RPM 25    Minutes 15    METs 3.43      REL-XR   Level 2    Speed 50    Minutes 15    METs 3.43      T5 Nustep   Level 3   T6   SPM 80    Minutes 15    METs 3.43      Rower   Level 3    Watts 20    Minutes 15    METs 3.43      Prescription Details   Frequency (times per week) 2    Duration Progress to 30 minutes of continuous aerobic without signs/symptoms of physical distress      Intensity   THRR 40-80% of Max Heartrate 106-145    Ratings of Perceived Exertion 11-13    Perceived Dyspnea 0-4      Progression   Progression Continue to progress workloads to maintain intensity without signs/symptoms of physical distress.      Resistance Training   Training Prescription Yes    Weight 5 lb    Reps 10-15          Exercise Goals: Frequency: Be able to perform aerobic exercise two to three times per week in program working toward 2-5 days per week of home exercise.  Intensity: Work with a perceived exertion of 11 (fairly light) - 15 (hard) while following your exercise prescription.  We will make changes to your prescription with you as you progress  through the program.   Duration: Be able to do 30 to 45 minutes of continuous aerobic exercise in addition to a 5 minute warm-up and a 5 minute cool-down routine.   Nutrition Goals: Your personal nutrition goals will be established when you do your nutrition analysis with the dietician.  The following are general nutrition guidelines to follow: Cholesterol < 200mg /day Sodium < 1500mg /day Fiber: Men over 50 yrs - 30 grams per day  Personal Goals:  Personal Goals and Risk Factors at Admission - 10/11/24 1340       Core Components/Risk Factors/Patient Goals on Admission    Weight Management Yes;Weight Maintenance    Intervention Weight Management: Develop a combined nutrition and exercise program designed to reach desired caloric intake, while maintaining appropriate intake of nutrient and fiber, sodium and fats, and appropriate energy expenditure required for the weight goal.;Weight Management: Provide education and appropriate resources to help participant work on and attain  dietary goals.;Weight Management/Obesity: Establish reasonable short term and long term weight goals.    Admit Weight 166 lb 12.8 oz (75.7 kg)    Goal Weight: Short Term 166 lb 12.8 oz (75.7 kg)    Goal Weight: Long Term 166 lb 12.8 oz (75.7 kg)    Expected Outcomes Short Term: Continue to assess and modify interventions until short term weight is achieved;Long Term: Adherence to nutrition and physical activity/exercise program aimed toward attainment of established weight goal;Weight Maintenance: Understanding of the daily nutrition guidelines, which includes 25-35% calories from fat, 7% or less cal from saturated fats, less than 200mg  cholesterol, less than 1.5gm of sodium, & 5 or more servings of fruits and vegetables daily;Understanding recommendations for meals to include 15-35% energy as protein, 25-35% energy from fat, 35-60% energy from carbohydrates, less than 200mg  of dietary cholesterol, 20-35 gm of total fiber  daily;Understanding of distribution of calorie intake throughout the day with the consumption of 4-5 meals/snacks    Tobacco Cessation Yes    Number of packs per day 1    Intervention Assist the participant in steps to quit. Provide individualized education and counseling about committing to Tobacco Cessation, relapse prevention, and pharmacological support that can be provided by physician.;Education officer, environmental, assist with locating and accessing local/national Quit Smoking programs, and support quit date choice.    Expected Outcomes Short Term: Will demonstrate readiness to quit, by selecting a quit date.;Short Term: Will quit all tobacco product use, adhering to prevention of relapse plan.;Long Term: Complete abstinence from all tobacco products for at least 12 months from quit date.    Heart Failure Yes    Intervention Provide a combined exercise and nutrition program that is supplemented with education, support and counseling about heart failure. Directed toward relieving symptoms such as shortness of breath, decreased exercise tolerance, and extremity edema.    Expected Outcomes Short term: Attendance in program 2-3 days a week with increased exercise capacity. Reported lower sodium intake. Reported increased fruit and vegetable intake. Reports medication compliance.;Improve functional capacity of life;Short term: Daily weights obtained and reported for increase. Utilizing diuretic protocols set by physician.;Long term: Adoption of self-care skills and reduction of barriers for early signs and symptoms recognition and intervention leading to self-care maintenance.    Hypertension Yes    Intervention Provide education on lifestyle modifcations including regular physical activity/exercise, weight management, moderate sodium restriction and increased consumption of fresh fruit, vegetables, and low fat dairy, alcohol moderation, and smoking cessation.;Monitor prescription use compliance.     Expected Outcomes Long Term: Maintenance of blood pressure at goal levels.;Short Term: Continued assessment and intervention until BP is < 140/70mm HG in hypertensive participants. < 130/40mm HG in hypertensive participants with diabetes, heart failure or chronic kidney disease.    Lipids Yes    Intervention Provide education and support for participant on nutrition & aerobic/resistive exercise along with prescribed medications to achieve LDL 70mg , HDL >40mg .    Expected Outcomes Short Term: Participant states understanding of desired cholesterol values and is compliant with medications prescribed. Participant is following exercise prescription and nutrition guidelines.;Long Term: Cholesterol controlled with medications as prescribed, with individualized exercise RX and with personalized nutrition plan. Value goals: LDL < 70mg , HDL > 40 mg.          Tobacco Use Initial Evaluation: Social History   Tobacco Use  Smoking Status Every Day   Current packs/day: 1.00   Average packs/day: 1 pack/day for 30.0 years (30.0 ttl pk-yrs)   Types: Cigarettes  Smokeless Tobacco Never    Exercise Goals and Review:  Exercise Goals     Row Name 10/11/24 1541             Exercise Goals   Increase Physical Activity Yes       Intervention Provide advice, education, support and counseling about physical activity/exercise needs.;Develop an individualized exercise prescription for aerobic and resistive training based on initial evaluation findings, risk stratification, comorbidities and participant's personal goals.       Expected Outcomes Short Term: Attend rehab on a regular basis to increase amount of physical activity.;Long Term: Exercising regularly at least 3-5 days a week.;Long Term: Add in home exercise to make exercise part of routine and to increase amount of physical activity.       Increase Strength and Stamina Yes       Intervention Provide advice, education, support and counseling about  physical activity/exercise needs.;Develop an individualized exercise prescription for aerobic and resistive training based on initial evaluation findings, risk stratification, comorbidities and participant's personal goals.       Expected Outcomes Short Term: Increase workloads from initial exercise prescription for resistance, speed, and METs.;Short Term: Perform resistance training exercises routinely during rehab and add in resistance training at home;Long Term: Improve cardiorespiratory fitness, muscular endurance and strength as measured by increased METs and functional capacity ( )       Able to understand and use rate of perceived exertion (RPE) scale Yes       Intervention Provide education and explanation on how to use RPE scale       Expected Outcomes Long Term:  Able to use RPE to guide intensity level when exercising independently;Short Term: Able to use RPE daily in rehab to express subjective intensity level       Able to understand and use Dyspnea scale Yes       Intervention Provide education and explanation on how to use Dyspnea scale       Expected Outcomes Short Term: Able to use Dyspnea scale daily in rehab to express subjective sense of shortness of breath during exertion;Long Term: Able to use Dyspnea scale to guide intensity level when exercising independently       Knowledge and understanding of Target Heart Rate Range (THRR) Yes       Intervention Provide education and explanation of THRR including how the numbers were predicted and where they are located for reference       Expected Outcomes Short Term: Able to state/look up THRR;Short Term: Able to use daily as guideline for intensity in rehab;Long Term: Able to use THRR to govern intensity when exercising independently       Able to check pulse independently Yes       Intervention Provide education and demonstration on how to check pulse in carotid and radial arteries.;Review the importance of being able to check your own  pulse for safety during independent exercise       Expected Outcomes Short Term: Able to explain why pulse checking is important during independent exercise;Long Term: Able to check pulse independently and accurately       Understanding of Exercise Prescription Yes       Intervention Provide education, explanation, and written materials on patient's individual exercise prescription       Expected Outcomes Short Term: Able to explain program exercise prescription;Long Term: Able to explain home exercise prescription to exercise independently

## 2024-10-11 NOTE — Progress Notes (Signed)
 Cardiac Individual Treatment Plan  Patient Details  Name: Kenneth Medina. MRN: [id number] Date of Birth: 12/10/1970 Referring Provider:   Flowsheet Row Cardiac Rehab from 10/11/2024 in [facility] Cardiac and Pulmonary Rehab  Referring Provider Amalia Bruckner, MD    Initial Encounter Date:  Flowsheet Row Cardiac Rehab from 10/11/2024 in [facility] Cardiac and Pulmonary Rehab  Date 10/11/24    Visit Diagnosis: Heart failure, chronic systolic (HCC)  Patient's Home Medications on Admission: Current Medications[1]  Past Medical History: Past Medical History:  Diagnosis Date   Coronary artery disease    a. s/p ant STEMI 5/15 >> LHC (5/15):  prox D1 30-40%, LAD occluded, prox PDA 20-30%, mid to dist Ant HK, EF 45% >>> PCI:  2.75x22 mm Resolute DES to mid LAD   Hyperlipidemia    Hypertension    Ischemic cardiomyopathy    a. EF 45% at [facility] at time of MI >> b. Echo (5/15):  Apical HK, no clot, EF 50%, mod LVH, normal RVF   MI (myocardial infarction) (HCC)    2015   Tobacco abuse     Tobacco Use: Tobacco Use History[2]  Labs: Review Flowsheet  More data exists      Latest Ref Rng & Units 10/16/2023 10/17/2023 10/19/2023 12/16/2023 05/24/2024  Labs for ITP Cardiac and Pulmonary Rehab  Cholestrol 100 - 199 mg/dL - - - 96  -  LDL (calc) 0 - 99 mg/dL - - - 53  -  HDL-C >60 mg/dL - - - 30  -  Trlycerides 0 - 149 mg/dL - - - 56  -  Hemoglobin A1c 4.8 - 5.6 % - - 6.0  - -  Bicarbonate 20.0 - 28.0 mmol/L 20.0 - 28.0 mmol/L - - - - 27.7  25.7   TCO2 22 - 32 mmol/L 22 - 32 mmol/L - - - - 29  27   O2 Saturation % % 67.3  69.5  - - 70  71     Details       Multiple values from one day are sorted in reverse-chronological order          Exercise Target Goals: Exercise Program Goal: Individual exercise prescription set using results from initial 6 min walk test and THRR while considering  patients activity barriers and safety.   Exercise Prescription Goal: Initial exercise  prescription builds to 30-45 minutes a day of aerobic activity, 2-3 days per week.  Home exercise guidelines will be given to patient during program as part of exercise prescription that the participant will acknowledge.   Education: Aerobic Exercise: - Group verbal and visual presentation on the components of exercise prescription. Introduces F.I.T.T principle from ACSM for exercise prescriptions.  Reviews F.I.T.T. principles of aerobic exercise including progression. Written material provided at class time.   Education: Resistance Exercise: - Group verbal and visual presentation on the components of exercise prescription. Introduces F.I.T.T principle from ACSM for exercise prescriptions  Reviews F.I.T.T. principles of resistance exercise including progression. Written material provided at class time.    Education: Exercise & Equipment Safety: - Individual verbal instruction and demonstration of equipment use and safety with use of the equipment. Flowsheet Row Cardiac Rehab from 10/11/2024 in [facility] Cardiac and Pulmonary Rehab  Date 10/11/24  Educator MB  Instruction Review Code 1- Verbalizes Understanding    Education: Exercise Physiology & General Exercise Guidelines: - Group verbal and written instruction with models to review the exercise physiology of the cardiovascular system and associated critical values. Provides general exercise  guidelines with specific guidelines to those with heart or lung disease. Written material provided at class time.   Education: Flexibility, Balance, Mind/Body Relaxation: - Group verbal and visual presentation with interactive activity on the components of exercise prescription. Introduces F.I.T.T principle from ACSM for exercise prescriptions. Reviews F.I.T.T. principles of flexibility and balance exercise training including progression. Also discusses the mind body connection.  Reviews various relaxation techniques to help reduce and manage stress (i.e. Deep  breathing, progressive muscle relaxation, and visualization). Balance handout provided to take home. Written material provided at class time.   Activity Barriers & Risk Stratification:  Activity Barriers & Cardiac Risk Stratification - 10/11/24 1530       Activity Barriers & Cardiac Risk Stratification   Activity Barriers Joint Problems;Deconditioning   R Knee   Cardiac Risk Stratification High          6 Minute Walk:  6 Minute Walk     Row Name 10/11/24 1526         6 Minute Walk   Phase Initial     Distance 1195 feet     Walk Time 6 minutes     # of Rest Breaks 0     MPH 2.26     METS 3.43     RPE 9     Perceived Dyspnea  0     VO2 Peak 12.01     Symptoms No     Resting HR 68 bpm     Resting BP 108/60     Resting Oxygen Saturation  95 %     Exercise Oxygen Saturation  during 6 min walk 97 %     Max Ex. HR 83 bpm     Max Ex. BP 102/60     2 Minute Post BP 102/60        Oxygen Initial Assessment:   Oxygen Re-Evaluation:   Oxygen Discharge (Final Oxygen Re-Evaluation):   Initial Exercise Prescription:  Initial Exercise Prescription - 10/11/24 1500       Date of Initial Exercise RX and Referring Provider   Date 10/11/24    Referring Provider Amalia Bruckner, MD      Oxygen   Maintain Oxygen Saturation 88% or higher      Treadmill   MPH 2.3    Grade 0    Minutes 15    METs 2.76      NuStep   Level 3    SPM 80    Minutes 15    METs 3.43      Arm Ergometer   Level 1    RPM 25    Minutes 15    METs 3.43      REL-XR   Level 2    Speed 50    Minutes 15    METs 3.43      T5 Nustep   Level 3   T6   SPM 80    Minutes 15    METs 3.43      Rower   Level 3    Watts 20    Minutes 15    METs 3.43      Prescription Details   Frequency (times per week) 2    Duration Progress to 30 minutes of continuous aerobic without signs/symptoms of physical distress      Intensity   THRR 40-80% of Max Heartrate 106-145    Ratings of  Perceived Exertion 11-13    Perceived Dyspnea 0-4      Progression  Progression Continue to progress workloads to maintain intensity without signs/symptoms of physical distress.      Resistance Training   Training Prescription Yes    Weight 5 lb    Reps 10-15          Perform Capillary Blood Glucose checks as needed.  Exercise Prescription Changes:   Exercise Prescription Changes     Row Name 10/11/24 1500             Response to Exercise   Blood Pressure (Admit) 108/60       Blood Pressure (Exercise) 102/60       Blood Pressure (Exit) 102/60       Heart Rate (Admit) 68 bpm       Heart Rate (Exercise) 83 bpm       Heart Rate (Exit) 73 bpm       Oxygen Saturation (Admit) 95 %       Oxygen Saturation (Exercise) 97 %       Oxygen Saturation (Exit) 97 %       Rating of Perceived Exertion (Exercise) 9       Perceived Dyspnea (Exercise) 0       Symptoms none       Comments results         Progression   Average METs 3.43          Exercise Comments:   Exercise Goals and Review:   Exercise Goals     Row Name 10/11/24 1541             Exercise Goals   Increase Physical Activity Yes       Intervention Provide advice, education, support and counseling about physical activity/exercise needs.;Develop an individualized exercise prescription for aerobic and resistive training based on initial evaluation findings, risk stratification, comorbidities and participant's personal goals.       Expected Outcomes Short Term: Attend rehab on a regular basis to increase amount of physical activity.;Long Term: Exercising regularly at least 3-5 days a week.;Long Term: Add in home exercise to make exercise part of routine and to increase amount of physical activity.       Increase Strength and Stamina Yes       Intervention Provide advice, education, support and counseling about physical activity/exercise needs.;Develop an individualized exercise prescription for aerobic and  resistive training based on initial evaluation findings, risk stratification, comorbidities and participant's personal goals.       Expected Outcomes Short Term: Increase workloads from initial exercise prescription for resistance, speed, and METs.;Short Term: Perform resistance training exercises routinely during rehab and add in resistance training at home;Long Term: Improve cardiorespiratory fitness, muscular endurance and strength as measured by increased METs and functional capacity ( )       Able to understand and use rate of perceived exertion (RPE) scale Yes       Intervention Provide education and explanation on how to use RPE scale       Expected Outcomes Long Term:  Able to use RPE to guide intensity level when exercising independently;Short Term: Able to use RPE daily in rehab to express subjective intensity level       Able to understand and use Dyspnea scale Yes       Intervention Provide education and explanation on how to use Dyspnea scale       Expected Outcomes Short Term: Able to use Dyspnea scale daily in rehab to express subjective sense of shortness of breath during exertion;Long Term: Able to  use Dyspnea scale to guide intensity level when exercising independently       Knowledge and understanding of Target Heart Rate Range (THRR) Yes       Intervention Provide education and explanation of THRR including how the numbers were predicted and where they are located for reference       Expected Outcomes Short Term: Able to state/look up THRR;Short Term: Able to use daily as guideline for intensity in rehab;Long Term: Able to use THRR to govern intensity when exercising independently       Able to check pulse independently Yes       Intervention Provide education and demonstration on how to check pulse in carotid and radial arteries.;Review the importance of being able to check your own pulse for safety during independent exercise       Expected Outcomes Short Term: Able to explain  why pulse checking is important during independent exercise;Long Term: Able to check pulse independently and accurately       Understanding of Exercise Prescription Yes       Intervention Provide education, explanation, and written materials on patient's individual exercise prescription       Expected Outcomes Short Term: Able to explain program exercise prescription;Long Term: Able to explain home exercise prescription to exercise independently          Exercise Goals Re-Evaluation :   Discharge Exercise Prescription (Final Exercise Prescription Changes):  Exercise Prescription Changes - 10/11/24 1500       Response to Exercise   Blood Pressure (Admit) 108/60    Blood Pressure (Exercise) 102/60    Blood Pressure (Exit) 102/60    Heart Rate (Admit) 68 bpm    Heart Rate (Exercise) 83 bpm    Heart Rate (Exit) 73 bpm    Oxygen Saturation (Admit) 95 %    Oxygen Saturation (Exercise) 97 %    Oxygen Saturation (Exit) 97 %    Rating of Perceived Exertion (Exercise) 9    Perceived Dyspnea (Exercise) 0    Symptoms none    Comments results      Progression   Average METs 3.43          Nutrition:  Target Goals: Understanding of nutrition guidelines, daily intake of sodium 1500mg , cholesterol 200mg , calories 30% from fat and 7% or less from saturated fats, daily to have 5 or more servings of fruits and vegetables.  Education: Nutrition 1 -Group instruction provided by verbal, written material, interactive activities, discussions, models, and posters to present general guidelines for heart healthy nutrition including macronutrients, label reading, and promoting whole foods over processed counterparts. Education serves as pensions consultant of discussion of heart healthy eating for all. Written material provided at class time.    Education: Nutrition 2 -Group instruction provided by verbal, written material, interactive activities, discussions, models, and posters to present general  guidelines for heart healthy nutrition including sodium, cholesterol, and saturated fat. Providing guidance of habit forming to improve blood pressure, cholesterol, and body weight. Written material provided at class time.     Biometrics:  Pre Biometrics - 10/11/24 1541       Pre Biometrics   Height 5' 9.5 (1.765 m)    Weight 166 lb 12.8 oz (75.7 kg)    Waist Circumference 36.8 inches    Hip Circumference 43 inches    Waist to Hip Ratio 0.86 %    BMI (Calculated) 24.29    Single Leg Stand 30 seconds  Nutrition Therapy Plan and Nutrition Goals:  Nutrition Therapy & Goals - 10/11/24 1543       Personal Nutrition Goals   Nutrition Goal Wants to wait a few weeks to schedule an appointment with the RD      Intervention Plan   Intervention Prescribe, educate and counsel regarding individualized specific dietary modifications aiming towards targeted core components such as weight, hypertension, lipid management, diabetes, heart failure and other comorbidities.    Expected Outcomes Short Term Goal: Understand basic principles of dietary content, such as calories, fat, sodium, cholesterol and nutrients.          Nutrition Assessments:  MEDIFICTS Score Key: >=70 Need to make dietary changes  40-70 Heart Healthy Diet <= 40 Therapeutic Level Cholesterol Diet   Picture Your Plate Scores: <59 Unhealthy dietary pattern with much room for improvement. 41-50 Dietary pattern unlikely to meet recommendations for good health and room for improvement. 51-60 More healthful dietary pattern, with some room for improvement.  >60 Healthy dietary pattern, although there may be some specific behaviors that could be improved.    Nutrition Goals Re-Evaluation:   Nutrition Goals Discharge (Final Nutrition Goals Re-Evaluation):   Psychosocial: Target Goals: Acknowledge presence or absence of significant depression and/or stress, maximize coping skills, provide positive support  system. Participant is able to verbalize types and ability to use techniques and skills needed for reducing stress and depression.   Education: Stress, Anxiety, and Depression - Group verbal and visual presentation to define topics covered.  Reviews how body is impacted by stress, anxiety, and depression.  Also discusses healthy ways to reduce stress and to treat/manage anxiety and depression. Written material provided at class time.   Education: Sleep Hygiene -Provides group verbal and written instruction about how sleep can affect your health.  Define sleep hygiene, discuss sleep cycles and impact of sleep habits. Review good sleep hygiene tips.   Initial Review & Psychosocial Screening:  Initial Psych Review & Screening - 10/11/24 1355       Initial Review   Current issues with Current Stress Concerns;Current Anxiety/Panic;Current Depression    Source of Stress Concerns Chronic Illness;Unable to participate in former interests or hobbies;Unable to perform yard/household activities      Family Dynamics   Good Support System? Yes      Screening Interventions   Interventions Encouraged to exercise;Provide feedback about the scores to participant;To provide support and resources with identified psychosocial needs    Expected Outcomes Short Term goal: Utilizing psychosocial counselor, staff and physician to assist with identification of specific Stressors or current issues interfering with healing process. Setting desired goal for each stressor or current issue identified.;Long Term Goal: Stressors or current issues are controlled or eliminated.;Short Term goal: Identification and review with participant of any Quality of Life or Depression concerns found by scoring the questionnaire.;Long Term goal: The participant improves quality of Life and PHQ9 Scores as seen by post scores and/or verbalization of changes          Quality of Life Scores:   Scores of 19 and below usually indicate a  poorer quality of life in these areas.  A difference of  2-3 points is a clinically meaningful difference.  A difference of 2-3 points in the total score of the Quality of Life Index has been associated with significant improvement in overall quality of life, self-image, physical symptoms, and general health in studies assessing change in quality of life.  PHQ-9: Review Flowsheet  10/11/2024  Depression screen PHQ 2/9  Decreased Interest 2  Down, Depressed, Hopeless 3  PHQ - 2 Score 5  Altered sleeping 3  Tired, decreased energy 3  Change in appetite 2  Feeling bad or failure about yourself  2  Trouble concentrating 2  Moving slowly or fidgety/restless 2  Suicidal thoughts 1  PHQ-9 Score 20  Difficult doing work/chores Somewhat difficult   Interpretation of Total Score  Total Score Depression Severity:  1-4 = Minimal depression, 5-9 = Mild depression, 10-14 = Moderate depression, 15-19 = Moderately severe depression, 20-27 = Severe depression   Psychosocial Evaluation and Intervention:  Psychosocial Evaluation - 10/11/24 1356       Psychosocial Evaluation & Interventions   Interventions Encouraged to exercise with the program and follow exercise prescription;Relaxation education;Stress management education    Comments Kenneth Medina is coming to cardiac rehab with heart failure. He states he recently got in with the [place] and what they are saying is different than his cardiac team in [place]. He wishes to discuss attending the program with his doctor in [place]. He knows his EF is low and they are wanting to drop his heart rate/blood pressure to help with his heart, but is concerned about exercise. He has been trying to get disability for a few years and has lawyers working on his case. Currently, he works as a marine scientist and notes that in the last few months he has had to slow down and not do as much. He helps take care of his wife who has a rare blood disorder. He  used to kayak and fish as a stress reliever, but is unable to keep up the kayakin due to his symptoms. He wants to quit smoking because his doctors told him he will either need a heart transplant or a LVAD in the future. He has a history of depression and anxiety. His doctor has given him a prescription for Lexapro , but he is wanting to do more research before taking it. He has thought about what if he wasn't around any more and it might would be easier on other people if he wasnt, but has no active plan. He wants to come to the program to work on his stamina and strength, after he talks with his doctor in [place].    Expected Outcomes Short: attend cardiac rehab for education and exercise Long: develop and maintain positive self care habits    Continue Psychosocial Services  Follow up required by staff          Psychosocial Re-Evaluation:   Psychosocial Discharge (Final Psychosocial Re-Evaluation):   Vocational Rehabilitation: Provide vocational rehab assistance to qualifying candidates.   Vocational Rehab Evaluation & Intervention:  Vocational Rehab - 10/11/24 1327       Initial Vocational Rehab Evaluation & Intervention   Assessment shows need for Vocational Rehabilitation No          Education: Education Goals: Education classes will be provided on a variety of topics geared toward better understanding of heart health and risk factor modification. Participant will state understanding/return demonstration of topics presented as noted by education test scores.  Learning Barriers/Preferences:  Learning Barriers/Preferences - 10/11/24 1326       Learning Barriers/Preferences   Learning Barriers None    Learning Preferences None          General Cardiac Education Topics:  AED/CPR: - Group verbal and written instruction with the use of models to demonstrate the basic use of the AED  with the basic ABC's of resuscitation.   Test and Procedures: - Group verbal and  visual presentation and models provide information about basic cardiac anatomy and function. Reviews the testing methods done to diagnose heart disease and the outcomes of the test results. Describes the treatment choices: Medical Management, Angioplasty, or Coronary Bypass Surgery for treating various heart conditions including Myocardial Infarction, Angina, Valve Disease, and Cardiac Arrhythmias. Written material provided at class time.   Medication Safety: - Group verbal and visual instruction to review commonly prescribed medications for heart and lung disease. Reviews the medication, class of the drug, and side effects. Includes the steps to properly store meds and maintain the prescription regimen. Written material provided at class time.   Intimacy: - Group verbal instruction through game format to discuss how heart and lung disease can affect sexual intimacy. Written material provided at class time.   Know Your Numbers and Heart Failure: - Group verbal and visual instruction to discuss disease risk factors for cardiac and pulmonary disease and treatment options.  Reviews associated critical values for Overweight/Obesity, Hypertension, Cholesterol, and Diabetes.  Discusses basics of heart failure: signs/symptoms and treatments.  Introduces Heart Failure Zone chart for action plan for heart failure. Written material provided at class time.   Infection Prevention: - Provides verbal and written material to individual with discussion of infection control including proper hand washing and proper equipment cleaning during exercise session. Flowsheet Row Cardiac Rehab from 10/11/2024 in [facility] Cardiac and Pulmonary Rehab  Date 10/11/24  Educator MB  Instruction Review Code 1- Verbalizes Understanding    Falls Prevention: - Provides verbal and written material to individual with discussion of falls prevention and safety. Flowsheet Row Cardiac Rehab from 10/11/2024 in [facility] Cardiac and Pulmonary  Rehab  Date 10/11/24  Educator MB  Instruction Review Code 1- Verbalizes Understanding    Other: -Provides group and verbal instruction on various topics (see comments)   Knowledge Questionnaire Score:   Core Components/Risk Factors/Patient Goals at Admission:  Personal Goals and Risk Factors at Admission - 10/11/24 1340       Core Components/Risk Factors/Patient Goals on Admission    Weight Management Yes;Weight Maintenance    Intervention Weight Management: Develop a combined nutrition and exercise program designed to reach desired caloric intake, while maintaining appropriate intake of nutrient and fiber, sodium and fats, and appropriate energy expenditure required for the weight goal.;Weight Management: Provide education and appropriate resources to help participant work on and attain dietary goals.;Weight Management/Obesity: Establish reasonable short term and long term weight goals.    Admit Weight 166 lb 12.8 oz (75.7 kg)    Goal Weight: Short Term 166 lb 12.8 oz (75.7 kg)    Goal Weight: Long Term 166 lb 12.8 oz (75.7 kg)    Expected Outcomes Short Term: Continue to assess and modify interventions until short term weight is achieved;Long Term: Adherence to nutrition and physical activity/exercise program aimed toward attainment of established weight goal;Weight Maintenance: Understanding of the daily nutrition guidelines, which includes 25-35% calories from fat, 7% or less cal from saturated fats, less than 200mg  cholesterol, less than 1.5gm of sodium, & 5 or more servings of fruits and vegetables daily;Understanding recommendations for meals to include 15-35% energy as protein, 25-35% energy from fat, 35-60% energy from carbohydrates, less than 200mg  of dietary cholesterol, 20-35 gm of total fiber daily;Understanding of distribution of calorie intake throughout the day with the consumption of 4-5 meals/snacks    Tobacco Cessation Yes    Number of packs  per day 1    Intervention  Assist the participant in steps to quit. Provide individualized education and counseling about committing to Tobacco Cessation, relapse prevention, and pharmacological support that can be provided by physician.;Education officer, environmental, assist with locating and accessing local/national Quit Smoking programs, and support quit date choice.    Expected Outcomes Short Term: Will demonstrate readiness to quit, by selecting a quit date.;Short Term: Will quit all tobacco product use, adhering to prevention of relapse plan.;Long Term: Complete abstinence from all tobacco products for at least 12 months from quit date.    Heart Failure Yes    Intervention Provide a combined exercise and nutrition program that is supplemented with education, support and counseling about heart failure. Directed toward relieving symptoms such as shortness of breath, decreased exercise tolerance, and extremity edema.    Expected Outcomes Short term: Attendance in program 2-3 days a week with increased exercise capacity. Reported lower sodium intake. Reported increased fruit and vegetable intake. Reports medication compliance.;Improve functional capacity of life;Short term: Daily weights obtained and reported for increase. Utilizing diuretic protocols set by physician.;Long term: Adoption of self-care skills and reduction of barriers for early signs and symptoms recognition and intervention leading to self-care maintenance.    Hypertension Yes    Intervention Provide education on lifestyle modifcations including regular physical activity/exercise, weight management, moderate sodium restriction and increased consumption of fresh fruit, vegetables, and low fat dairy, alcohol moderation, and smoking cessation.;Monitor prescription use compliance.    Expected Outcomes Long Term: Maintenance of blood pressure at goal levels.;Short Term: Continued assessment and intervention until BP is < 140/61mm HG in hypertensive participants. < 130/16mm  HG in hypertensive participants with diabetes, heart failure or chronic kidney disease.    Lipids Yes    Intervention Provide education and support for participant on nutrition & aerobic/resistive exercise along with prescribed medications to achieve LDL 70mg , HDL >40mg .    Expected Outcomes Short Term: Participant states understanding of desired cholesterol values and is compliant with medications prescribed. Participant is following exercise prescription and nutrition guidelines.;Long Term: Cholesterol controlled with medications as prescribed, with individualized exercise RX and with personalized nutrition plan. Value goals: LDL < 70mg , HDL > 40 mg.          Education:Diabetes - Individual verbal and written instruction to review signs/symptoms of diabetes, desired ranges of glucose level fasting, after meals and with exercise. Acknowledge that pre and post exercise glucose checks will be done for 3 sessions at entry of program.   Core Components/Risk Factors/Patient Goals Review:    Core Components/Risk Factors/Patient Goals at Discharge (Final Review):    ITP Comments:  ITP Comments     Row Name 10/11/24 1352           ITP Comments Completed program orientation and . Initial ITP created and sent for review to Medical Director.          Comments: Initial ITP    [1]  Current Outpatient Medications:    aspirin  81 MG chewable tablet, Chew 1 tablet (81 mg total) by mouth daily., Disp: , Rfl:    atorvastatin  (LIPITOR ) 80 MG tablet, Take 1 tablet (80 mg total) by mouth daily at 6 PM., Disp: 30 tablet, Rfl: 11   clopidogrel  (PLAVIX ) 75 MG tablet, Take 1 tablet (75 mg total) by mouth daily., Disp: 90 tablet, Rfl: 2   ezetimibe  (ZETIA ) 10 MG tablet, Take 1 tablet (10 mg total) by mouth daily., Disp: 30 tablet, Rfl: 11   furosemide  (LASIX )  20 MG tablet, Take 1 tablet (20 mg total) by mouth daily. Please take an additional 20 MG as needed for weight gain or swelling, Disp: 30  tablet, Rfl: 11   JARDIANCE  10 MG TABS tablet, TAKE ONE TABLET BY MOUTH DAILY, Disp: 90 tablet, Rfl: 2   losartan  (COZAAR ) 25 MG tablet, Take 1 tablet (25 mg total) by mouth daily., Disp: 30 tablet, Rfl: 11   metoprolol  succinate (TOPROL -XL) 25 MG 24 hr tablet, Take 1 tablet (25 mg total) by mouth daily., Disp: 90 tablet, Rfl: 1   nitroGLYCERIN  (NITROSTAT ) 0.4 MG SL tablet, Place 1 tablet (0.4 mg total) under the tongue every 5 (five) minutes x 3 doses as needed for chest pain., Disp: 25 tablet, Rfl: 5   sildenafil  (VIAGRA ) 25 MG tablet, Take 1 tablet (25 mg total) by mouth daily as needed for erectile dysfunction., Disp: 5 tablet, Rfl: 0   escitalopram  (LEXAPRO ) 10 MG tablet, Take 1 tablet (10 mg total) by mouth daily. (Patient not taking: Reported on 10/11/2024), Disp: 30 tablet, Rfl: 11   losartan  (COZAAR ) 25 MG tablet, Take 0.5 tablets (12.5 mg total) by mouth daily., Disp: 45 tablet, Rfl: 3 [2]  Social History Tobacco Use  Smoking Status Every Day   Current packs/day: 1.00   Average packs/day: 1 pack/day for 30.0 years (30.0 ttl pk-yrs)   Types: Cigarettes  Smokeless Tobacco Never

## 2024-10-17 ENCOUNTER — Encounter

## 2024-10-17 DIAGNOSIS — I5022 Chronic systolic (congestive) heart failure: Secondary | ICD-10-CM

## 2024-10-17 NOTE — Progress Notes (Signed)
 Daily Session Note  Patient Details  Name: Kenneth Medina. MRN: [id number] Date of Birth: Jan 21, 1971 Referring Provider:   Flowsheet Row Cardiac Rehab from 10/11/2024 in [facility] Cardiac and Pulmonary Rehab  Referring Provider Amalia Bruckner, MD    Encounter Date: 10/17/2024  Check In:  Session Check In - 10/17/24 0925       Check-In   Supervising physician immediately available to respond to emergencies See telemetry face sheet for immediately available ER MD    Location [facility]-Cardiac & Pulmonary Rehab    Staff Present Burnard Davenport RN,BSN,MPA;Maxon Conetta BS, Exercise Physiologist;Margaret Best, MS, Exercise Physiologist;Noah Tickle, BS, Exercise Physiologist    Virtual Visit No    Medication changes reported     No    Fall or balance concerns reported    No    Tobacco Cessation Use Decreased    Current number of cigarettes/nicotine  per day     5    Warm-up and Cool-down Performed on first and last piece of equipment    Resistance Training Performed Yes    VAD Patient? No    PAD/SET Patient? No      Pain Assessment   Currently in Pain? No/denies             Tobacco Use History[1]  Goals Met:  Independence with exercise equipment Exercise tolerated well No report of concerns or symptoms today Strength training completed today  Goals Unmet:  Not Applicable  Comments: First full day of exercise!  Patient was oriented to gym and equipment including functions, settings, policies, and procedures.  Patient's individual exercise prescription and treatment plan were reviewed.  All starting workloads were established based on the results of the 6 minute walk test done at initial orientation visit.  The plan for exercise progression was also introduced and progression will be customized based on patient's performance and goals.    Dr. Oneil Pinal is Medical Director for [facility] Cardiac Rehabilitation.  Dr. Fuad Aleskerov is Medical Director for [facility]  Pulmonary Rehabilitation.    [1]  Social History Tobacco Use  Smoking Status Every Day   Current packs/day: 1.00   Average packs/day: 1 pack/day for 30.0 years (30.0 ttl pk-yrs)   Types: Cigarettes  Smokeless Tobacco Never

## 2024-10-19 ENCOUNTER — Encounter: Admitting: Emergency Medicine

## 2024-10-19 DIAGNOSIS — I5022 Chronic systolic (congestive) heart failure: Secondary | ICD-10-CM

## 2024-10-19 NOTE — Progress Notes (Signed)
 Daily Session Note  Patient Details  Name: Kenneth Medina. MRN: [id number] Date of Birth: [birth year] Referring Provider:   Flowsheet Row Cardiac Rehab from 10/11/2024 in [facility] Cardiac and Pulmonary Rehab  Referring Provider Amalia Bruckner, MD    Encounter Date: 10/19/2024  Check In:  Session Check In - 10/19/24 9062       Check-In   Supervising physician immediately available to respond to emergencies See telemetry face sheet for immediately available ER MD    Location [facility]-Cardiac & Pulmonary Rehab    Staff Present Leita Franks RN,BSN;[facility] BS, Exercise Physiologist;Noah Tickle, BS, Exercise Physiologist    Virtual Visit No    Medication changes reported     No    Fall or balance concerns reported    No    Tobacco Cessation No Change    Warm-up and Cool-down Performed on first and last piece of equipment    Resistance Training Performed Yes    VAD Patient? No    PAD/SET Patient? No      Pain Assessment   Currently in Pain? No/denies             Tobacco Use History[1]  Goals Met:  Independence with exercise equipment Exercise tolerated well No report of concerns or symptoms today Strength training completed today  Goals Unmet:  Not Applicable  Comments: Pt able to follow exercise prescription today without complaint.  Will continue to monitor for progression.    Dr. Oneil Pinal is Medical Director for [facility] Cardiac Rehabilitation.  Dr. Fuad Aleskerov is Medical Director for [facility] Pulmonary Rehabilitation.    [1]  Social History Tobacco Use  Smoking Status Every Day   Current packs/day: 1.00   Average packs/day: 1 pack/day for 30.0 years (30.0 ttl pk-yrs)   Types: Cigarettes  Smokeless Tobacco Never

## 2024-10-24 ENCOUNTER — Encounter

## 2024-10-26 ENCOUNTER — Encounter

## 2024-10-26 ENCOUNTER — Ambulatory Visit: Admitting: Internal Medicine

## 2024-10-31 ENCOUNTER — Encounter

## 2024-11-02 ENCOUNTER — Encounter

## 2024-11-07 ENCOUNTER — Encounter

## 2024-11-09 ENCOUNTER — Encounter

## 2024-11-14 ENCOUNTER — Encounter

## 2024-11-16 ENCOUNTER — Encounter

## 2024-11-21 ENCOUNTER — Encounter

## 2024-11-23 ENCOUNTER — Encounter

## 2024-11-28 ENCOUNTER — Encounter

## 2024-11-30 ENCOUNTER — Encounter

## 2024-12-05 ENCOUNTER — Encounter

## 2024-12-07 ENCOUNTER — Encounter

## 2024-12-12 ENCOUNTER — Encounter

## 2024-12-14 ENCOUNTER — Encounter

## 2024-12-19 ENCOUNTER — Encounter

## 2024-12-21 ENCOUNTER — Encounter

## 2024-12-26 ENCOUNTER — Encounter

## 2024-12-28 ENCOUNTER — Encounter

## 2025-01-02 ENCOUNTER — Encounter

## 2025-01-04 ENCOUNTER — Encounter

## 2025-01-09 ENCOUNTER — Encounter

## 2025-01-11 ENCOUNTER — Encounter

## 2025-01-16 ENCOUNTER — Encounter

## 2025-01-18 ENCOUNTER — Encounter

## 2025-01-23 ENCOUNTER — Encounter

## 2025-01-25 ENCOUNTER — Encounter

## 2025-01-30 ENCOUNTER — Encounter

## 2025-02-01 ENCOUNTER — Encounter

## 2025-02-06 ENCOUNTER — Encounter

## 2025-02-08 ENCOUNTER — Encounter

## 2025-04-05 ENCOUNTER — Encounter

## 2025-07-05 ENCOUNTER — Encounter
